# Patient Record
Sex: Male | Born: 1935 | Race: White | Hispanic: No | Marital: Married | Smoking: Former smoker
Health system: Southern US, Community
[De-identification: ages and names within clinical notes are randomized; demographics above are authoritative.]

## PROBLEM LIST (undated history)

## (undated) DIAGNOSIS — I6529 Occlusion and stenosis of unspecified carotid artery: Secondary | ICD-10-CM

## (undated) DIAGNOSIS — E785 Hyperlipidemia, unspecified: Secondary | ICD-10-CM

## (undated) DIAGNOSIS — H5440 Blindness, one eye, unspecified eye: Secondary | ICD-10-CM

## (undated) DIAGNOSIS — I10 Essential (primary) hypertension: Secondary | ICD-10-CM

## (undated) DIAGNOSIS — H9190 Unspecified hearing loss, unspecified ear: Secondary | ICD-10-CM

## (undated) HISTORY — DX: Essential (primary) hypertension: I10

## (undated) HISTORY — DX: Hyperlipidemia, unspecified: E78.5

## (undated) HISTORY — PX: CAROTID ENDARTERECTOMY: SUR193

## (undated) HISTORY — DX: Occlusion and stenosis of unspecified carotid artery: I65.29

---

## 2001-04-19 ENCOUNTER — Encounter: Payer: Self-pay | Admitting: *Deleted

## 2001-04-19 ENCOUNTER — Ambulatory Visit (HOSPITAL_COMMUNITY): Admission: RE | Admit: 2001-04-19 | Discharge: 2001-04-19 | Payer: Self-pay | Admitting: *Deleted

## 2002-07-07 ENCOUNTER — Ambulatory Visit (HOSPITAL_COMMUNITY): Admission: RE | Admit: 2002-07-07 | Discharge: 2002-07-07 | Payer: Self-pay | Admitting: Family Medicine

## 2002-07-07 ENCOUNTER — Encounter: Payer: Self-pay | Admitting: Family Medicine

## 2003-08-14 ENCOUNTER — Ambulatory Visit (HOSPITAL_COMMUNITY): Admission: RE | Admit: 2003-08-14 | Discharge: 2003-08-14 | Payer: Self-pay | Admitting: Family Medicine

## 2003-09-04 ENCOUNTER — Ambulatory Visit (HOSPITAL_COMMUNITY): Admission: RE | Admit: 2003-09-04 | Discharge: 2003-09-04 | Payer: Self-pay | Admitting: General Surgery

## 2005-01-27 ENCOUNTER — Ambulatory Visit (HOSPITAL_COMMUNITY): Admission: RE | Admit: 2005-01-27 | Discharge: 2005-01-27 | Payer: Self-pay | Admitting: Family Medicine

## 2005-11-19 ENCOUNTER — Emergency Department (HOSPITAL_COMMUNITY): Admission: EM | Admit: 2005-11-19 | Discharge: 2005-11-19 | Payer: Self-pay | Admitting: Emergency Medicine

## 2008-03-20 ENCOUNTER — Ambulatory Visit (HOSPITAL_COMMUNITY): Admission: RE | Admit: 2008-03-20 | Discharge: 2008-03-20 | Payer: Self-pay | Admitting: Family Medicine

## 2008-03-28 ENCOUNTER — Ambulatory Visit (HOSPITAL_COMMUNITY): Admission: RE | Admit: 2008-03-28 | Discharge: 2008-03-28 | Payer: Self-pay | Admitting: Family Medicine

## 2008-04-03 ENCOUNTER — Ambulatory Visit: Payer: Self-pay | Admitting: Vascular Surgery

## 2008-08-06 ENCOUNTER — Ambulatory Visit (HOSPITAL_COMMUNITY): Admission: RE | Admit: 2008-08-06 | Discharge: 2008-08-06 | Payer: Self-pay | Admitting: Family Medicine

## 2008-10-23 ENCOUNTER — Ambulatory Visit: Payer: Self-pay | Admitting: Vascular Surgery

## 2009-02-16 ENCOUNTER — Emergency Department (HOSPITAL_COMMUNITY): Admission: EM | Admit: 2009-02-16 | Discharge: 2009-02-16 | Payer: Self-pay | Admitting: Emergency Medicine

## 2009-02-17 ENCOUNTER — Inpatient Hospital Stay (HOSPITAL_COMMUNITY): Admission: EM | Admit: 2009-02-17 | Discharge: 2009-02-21 | Payer: Self-pay | Admitting: Emergency Medicine

## 2009-02-17 ENCOUNTER — Encounter: Payer: Self-pay | Admitting: Vascular Surgery

## 2009-02-17 ENCOUNTER — Encounter (INDEPENDENT_AMBULATORY_CARE_PROVIDER_SITE_OTHER): Payer: Self-pay | Admitting: Internal Medicine

## 2009-02-19 ENCOUNTER — Ambulatory Visit: Payer: Self-pay | Admitting: Surgery

## 2009-02-20 ENCOUNTER — Encounter: Payer: Self-pay | Admitting: Vascular Surgery

## 2009-03-19 ENCOUNTER — Ambulatory Visit: Payer: Self-pay | Admitting: Vascular Surgery

## 2009-03-28 ENCOUNTER — Ambulatory Visit: Payer: Self-pay | Admitting: Vascular Surgery

## 2009-03-28 ENCOUNTER — Encounter: Payer: Self-pay | Admitting: Vascular Surgery

## 2009-03-28 ENCOUNTER — Inpatient Hospital Stay (HOSPITAL_COMMUNITY): Admission: RE | Admit: 2009-03-28 | Discharge: 2009-03-29 | Payer: Self-pay | Admitting: Vascular Surgery

## 2009-04-16 ENCOUNTER — Ambulatory Visit: Payer: Self-pay | Admitting: Vascular Surgery

## 2009-09-28 IMAGING — CR DG CHEST 2V
2 series · 2 of 2 positions shown · non-contrast
Comparison: Chest 03/20/2008.

CLINICAL DATA: Preoperative respiratory film for patient with left
internal carotid artery stenosis.

CHEST - 2 VIEW

[view not recorded (1 of 2)]
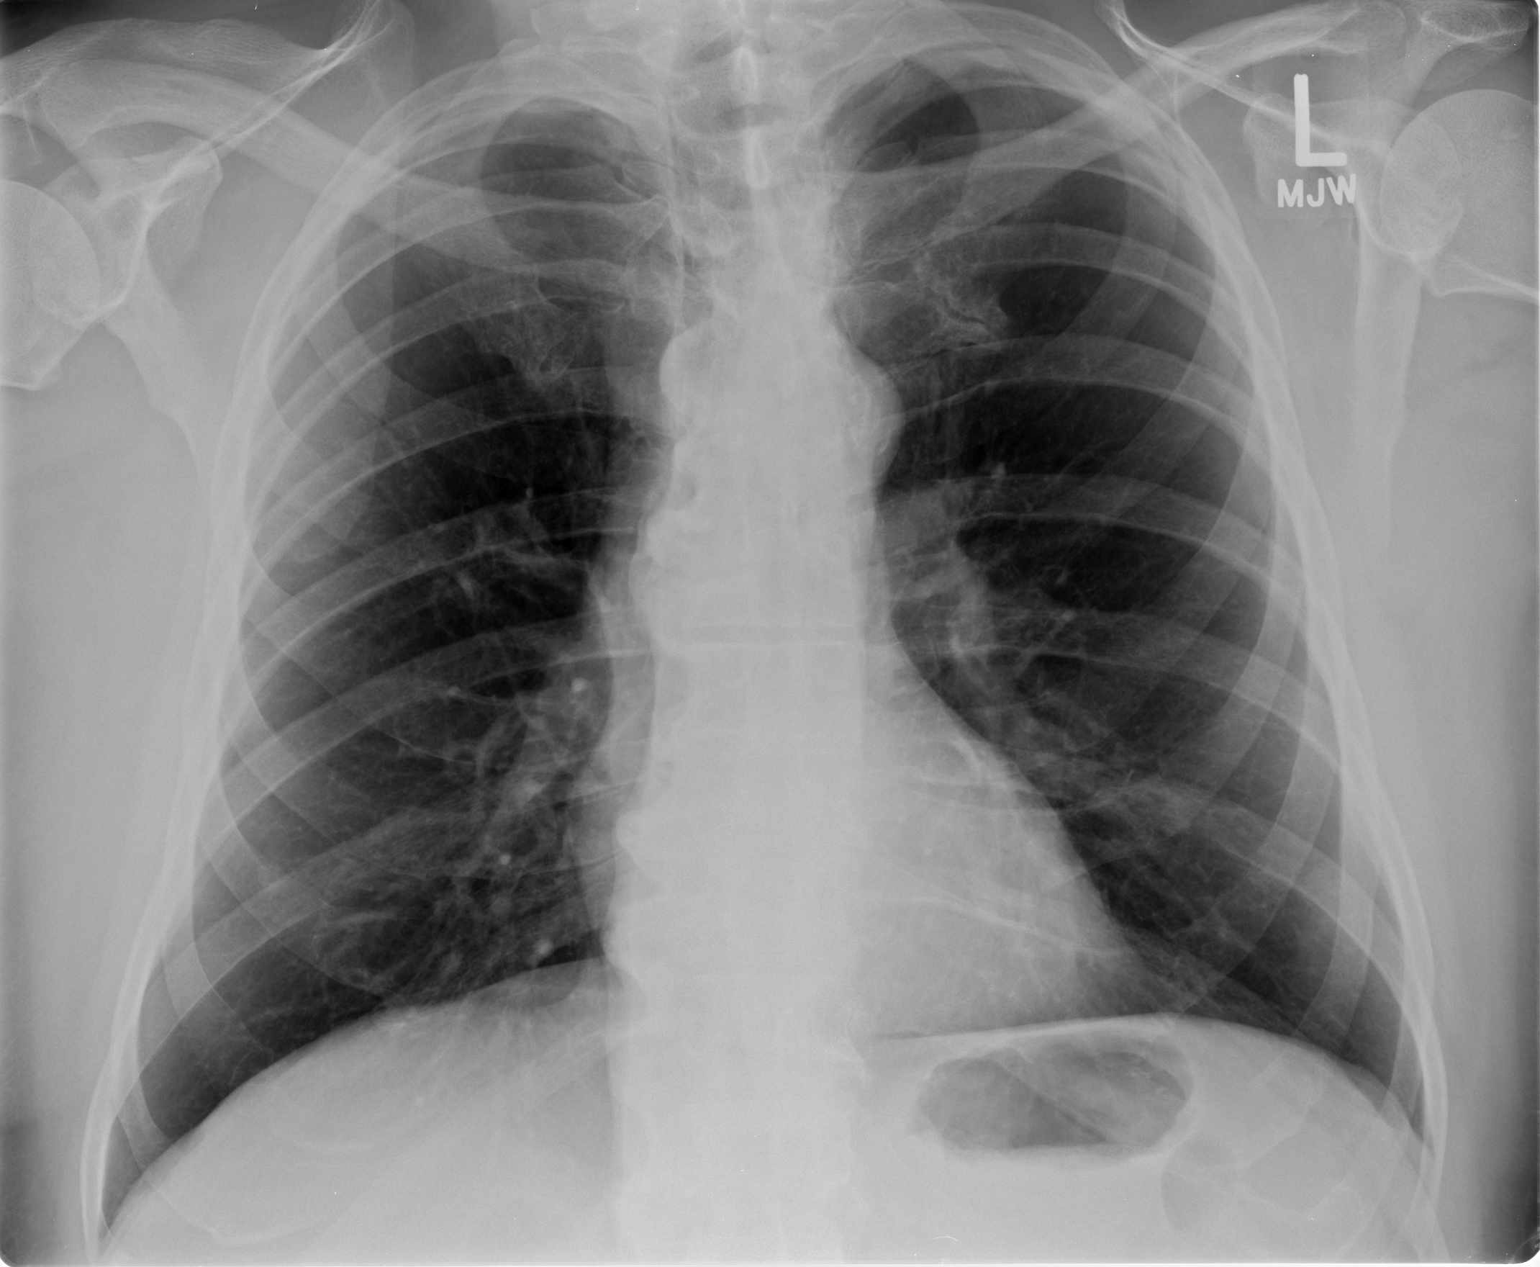

[view not recorded (2 of 2)]
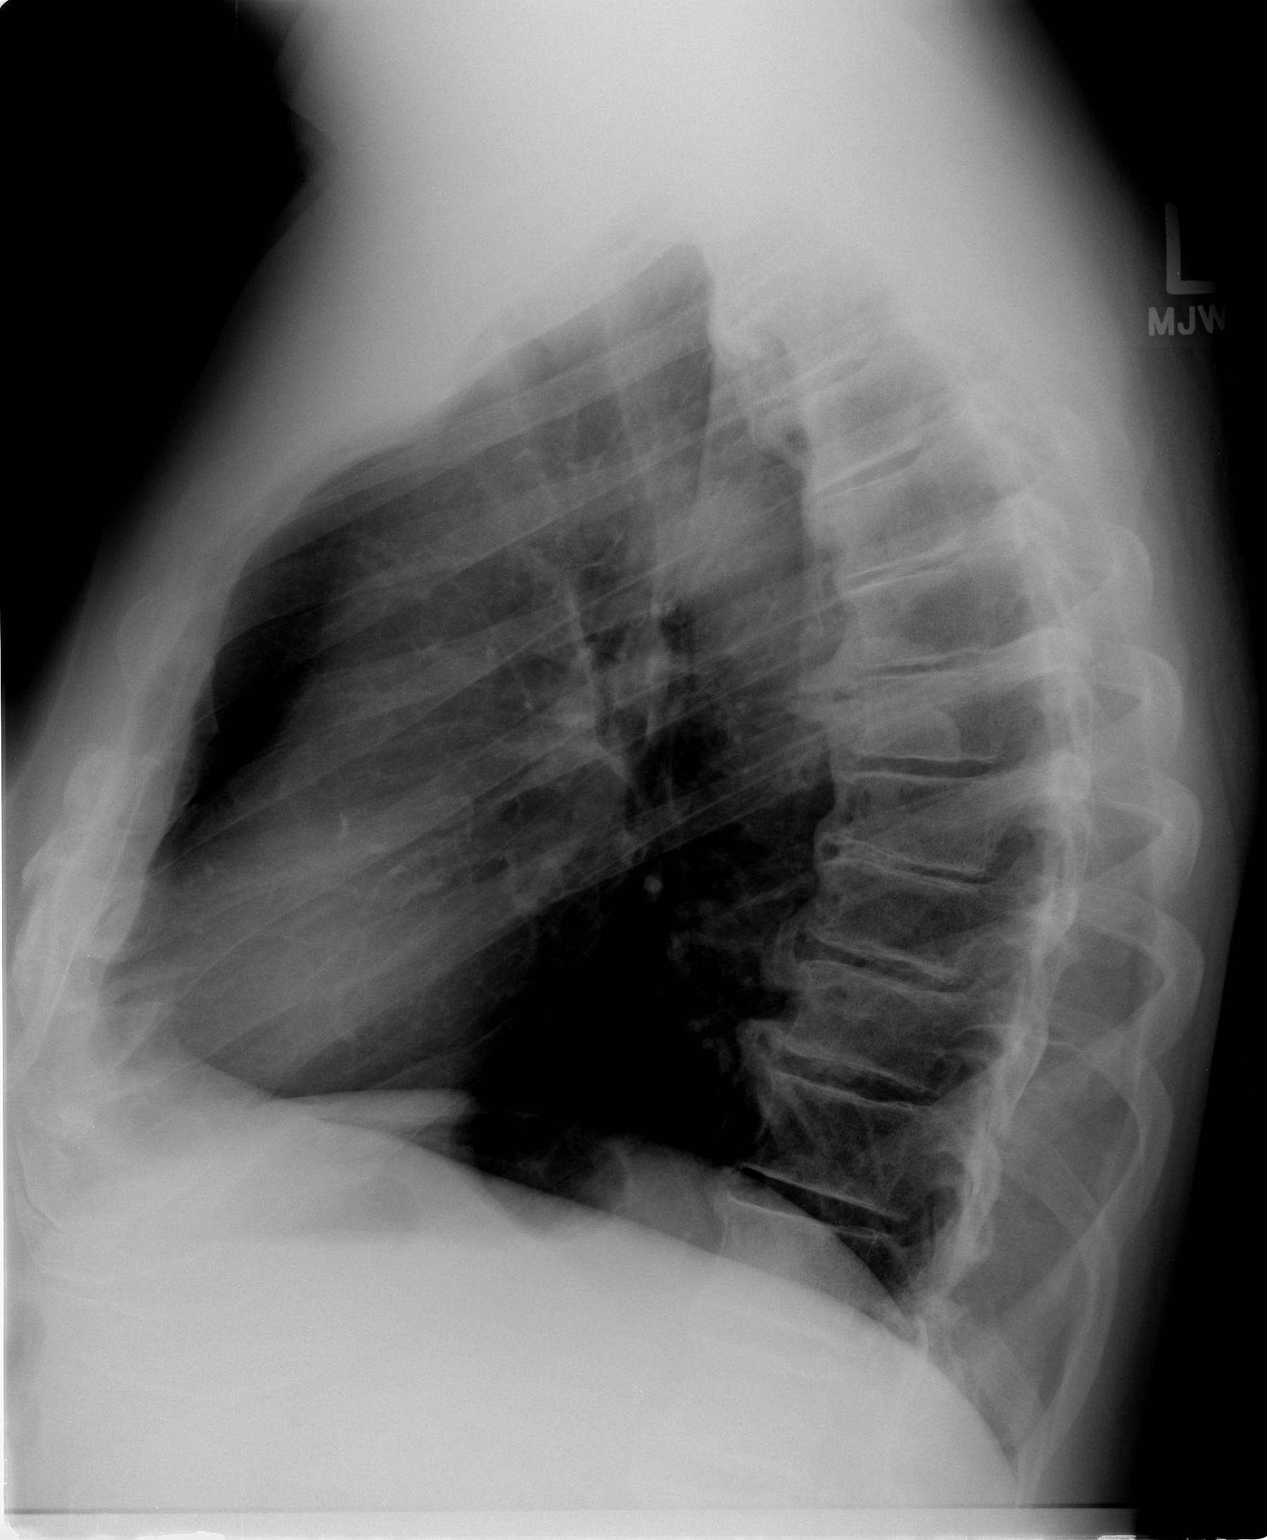

[2 of 2 positions shown; findings below may reference images not displayed]

FINDINGS: The chest appears hyperexpanded with flattening of the
hemidiaphragms.  Small nodular opacity in the right lower lung zone
is unchanged and likely the patient's nipple.  There is no focal
airspace disease or effusion.  Heart size is normal.  Marked
thoracic degenerative disease noted.
IMPRESSION: No acute finding.  Stable compared to prior exam.

## 2009-10-17 ENCOUNTER — Ambulatory Visit: Payer: Self-pay | Admitting: Vascular Surgery

## 2010-01-07 ENCOUNTER — Ambulatory Visit (HOSPITAL_COMMUNITY): Admission: RE | Admit: 2010-01-07 | Discharge: 2010-01-07 | Payer: Self-pay | Admitting: Internal Medicine

## 2010-08-12 ENCOUNTER — Ambulatory Visit: Payer: Self-pay | Admitting: Vascular Surgery

## 2010-10-26 ENCOUNTER — Encounter: Payer: Self-pay | Admitting: Family Medicine

## 2011-01-12 LAB — BASIC METABOLIC PANEL
CO2: 26 mEq/L (ref 19–32)
Creatinine, Ser: 0.79 mg/dL (ref 0.4–1.5)
GFR calc Af Amer: >60 mL/min (ref >60)
Potassium: 3.3 mEq/L — ABNORMAL LOW (ref 3.5–5.1)
Sodium: 134 mEq/L — ABNORMAL LOW (ref 135–145)

## 2011-01-12 LAB — COMPREHENSIVE METABOLIC PANEL
ALT: 30 U/L (ref 0–53)
AST: 34 U/L (ref 0–37)
CO2: 27 mEq/L (ref 19–32)
Calcium: 9.5 mg/dL (ref 8.4–10.5)
Creatinine, Ser: 0.95 mg/dL (ref 0.4–1.5)
Glucose, Bld: 96 mg/dL (ref 70–99)
Potassium: 4.1 mEq/L (ref 3.5–5.1)
Sodium: 138 mEq/L (ref 135–145)
Total Bilirubin: 1 mg/dL (ref 0.3–1.2)
Total Protein: 7.5 g/dL (ref 6.0–8.3)

## 2011-01-12 LAB — URINALYSIS, ROUTINE W REFLEX MICROSCOPIC
Bilirubin Urine: NEGATIVE
Glucose, UA: NEGATIVE mg/dL
Ketones, ur: NEGATIVE mg/dL
Leukocytes, UA: NEGATIVE
Nitrite: NEGATIVE
Protein, ur: NEGATIVE mg/dL
Specific Gravity, Urine: 1.02 (ref 1.005–1.030)
Urobilinogen, UA: 0.2 mg/dL (ref 0.0–1.0)
pH: 5.5 (ref 5.0–8.0)

## 2011-01-12 LAB — CBC
HCT: 35.3 % — ABNORMAL LOW (ref 39.0–52.0)
HCT: 40.8 % (ref 39.0–52.0)
Hemoglobin: 12.2 g/dL — ABNORMAL LOW (ref 13.0–17.0)
Hemoglobin: 13.9 g/dL (ref 13.0–17.0)
MCHC: 34.1 g/dL (ref 30.0–36.0)
MCHC: 34.7 g/dL (ref 30.0–36.0)
MCV: 95.3 fL (ref 78.0–100.0)
Platelets: 174 10*3/uL (ref 150–400)
RBC: 3.7 MIL/uL — ABNORMAL LOW (ref 4.22–5.81)
RBC: 4.27 MIL/uL (ref 4.22–5.81)
RDW: 13.1 % (ref 11.5–15.5)
WBC: 8.9 10*3/uL (ref 4.0–10.5)

## 2011-01-12 LAB — TYPE AND SCREEN: Antibody Screen: NEGATIVE

## 2011-01-12 LAB — APTT: aPTT: 30 seconds (ref 24–37)

## 2011-01-12 LAB — URINE MICROSCOPIC-ADD ON

## 2011-01-12 LAB — PROTIME-INR
INR: 1 (ref 0.00–1.49)
Prothrombin Time: 13.8 seconds (ref 11.6–15.2)

## 2011-01-12 LAB — ABO/RH: ABO/RH(D): A POS

## 2011-01-13 LAB — DIFFERENTIAL
Basophils Absolute: 0 10*3/uL (ref 0.0–0.1)
Eosinophils Absolute: 0.1 10*3/uL (ref 0.0–0.7)
Eosinophils Relative: 1 % (ref 0–5)
Monocytes Absolute: 0.5 10*3/uL (ref 0.1–1.0)

## 2011-01-13 LAB — CBC
HCT: 39.1 % (ref 39.0–52.0)
Hemoglobin: 13.7 g/dL (ref 13.0–17.0)
MCHC: 35.2 g/dL (ref 30.0–36.0)
MCHC: 35.8 g/dL (ref 30.0–36.0)
MCV: 94.9 fL (ref 78.0–100.0)
Platelets: 163 10*3/uL (ref 150–400)
Platelets: 184 10*3/uL (ref 150–400)
Platelets: 206 10*3/uL (ref 150–400)
RBC: 3.58 MIL/uL — ABNORMAL LOW (ref 4.22–5.81)
RBC: 4.12 MIL/uL — ABNORMAL LOW (ref 4.22–5.81)
RBC: 4.3 MIL/uL (ref 4.22–5.81)
RDW: 12.5 % (ref 11.5–15.5)
RDW: 12.5 % (ref 11.5–15.5)
WBC: 8.1 10*3/uL (ref 4.0–10.5)
WBC: 9 10*3/uL (ref 4.0–10.5)

## 2011-01-13 LAB — BASIC METABOLIC PANEL
BUN: 10 mg/dL (ref 6–23)
BUN: 7 mg/dL (ref 6–23)
CO2: 25 mEq/L (ref 19–32)
CO2: 27 mEq/L (ref 19–32)
Calcium: 8.8 mg/dL (ref 8.4–10.5)
Calcium: 9.3 mg/dL (ref 8.4–10.5)
Chloride: 105 mEq/L (ref 96–112)
Creatinine, Ser: 0.83 mg/dL (ref 0.4–1.5)
Creatinine, Ser: 0.84 mg/dL (ref 0.4–1.5)
GFR calc Af Amer: >60 mL/min (ref >60)
GFR calc Af Amer: >60 mL/min (ref >60)
GFR calc non Af Amer: >60 mL/min (ref >60)
Glucose, Bld: 108 mg/dL — ABNORMAL HIGH (ref 70–99)
Glucose, Bld: 120 mg/dL — ABNORMAL HIGH (ref 70–99)
Potassium: 3.7 mEq/L (ref 3.5–5.1)
Sodium: 139 mEq/L (ref 135–145)

## 2011-01-13 LAB — HEMOGLOBIN A1C
Hgb A1c MFr Bld: 5.6 % (ref 4.6–6.1)
Mean Plasma Glucose: 114 mg/dL

## 2011-01-13 LAB — COMPREHENSIVE METABOLIC PANEL
ALT: 38 U/L (ref 0–53)
AST: 36 U/L (ref 0–37)
Albumin: 4.2 g/dL (ref 3.5–5.2)
CO2: 26 mEq/L (ref 19–32)
Chloride: 103 mEq/L (ref 96–112)
GFR calc Af Amer: >60 mL/min (ref >60)
GFR calc non Af Amer: >60 mL/min (ref >60)
Potassium: 3.6 mEq/L (ref 3.5–5.1)
Sodium: 138 mEq/L (ref 135–145)
Total Bilirubin: 0.6 mg/dL (ref 0.3–1.2)

## 2011-01-13 LAB — PROTIME-INR
INR: 1.8 — ABNORMAL HIGH (ref 0.00–1.49)
Prothrombin Time: 21.7 seconds — ABNORMAL HIGH (ref 11.6–15.2)

## 2011-01-13 LAB — LIPID PANEL
Cholesterol: 203 mg/dL — ABNORMAL HIGH (ref 0–200)
Total CHOL/HDL Ratio: 5.6 RATIO

## 2011-01-13 LAB — SEDIMENTATION RATE: Sed Rate: 20 mm/hr — ABNORMAL HIGH (ref 0–16)

## 2011-01-13 LAB — APTT: aPTT: >200 seconds (ref 24–37)

## 2011-01-13 LAB — HOMOCYSTEINE: Homocysteine: 7.6 umol/L (ref 4.0–15.4)

## 2011-01-13 LAB — C-REACTIVE PROTEIN: CRP: 0.1 mg/dL — ABNORMAL LOW (ref <0.6)

## 2011-02-17 NOTE — Consult Note (Signed)
NAME:  Jeffrey Ho, MOTTON NO.:  [id number]   MEDICAL RECORD NO.:  [id number]          PATIENT TYPE:  OBV   LOCATION:  3012                         FACILITY:  [facility]   PHYSICIAN:  Quita Skye. Hart Rochester, M.D.  DATE OF BIRTH:  [birth year]   DATE OF CONSULTATION:  02/17/2009  DATE OF DISCHARGE:                                 CONSULTATION   REFERRED BY:  [facility].   REASON FOR CONSULTATION:  Bilateral carotid disease with right retinal  embolus.   HISTORY OF PRESENT ILLNESS:  This is a 76 year old male patient  previously evaluated by Dr. Waverly Ferrari for asymptomatic  bilateral carotid disease developed acute onset loss of vision in the  right eye, 5:30 p.m. on Feb 16, 2009 was seen by his ophthalmologist who  diagnosed a right central retinal artery occlusion.  The patient was  admitted to [facility].  He had no previous history of hemiparesis,  aphasia, amaurosis fugax, diplopia, blurred vision, or syncope.  CT scan  revealed no evidence of acute stroke or hemorrhage.   PAST MEDICAL HISTORY:  Positive for hypertension and hyperlipidemia.   SOCIAL HISTORY:  The patient is a nonsmoker.   ALLERGIES:  None known.   MEDICATIONS:  1. Aspirin.  2. Simvastatin.  3. Toprol-XL.  4. Hydrochlorothiazide.  Exam  Neurologic----Blindness right eye----No other focal defecits noted   LABORATORY DATA:  Carotid duplex exam performed on Feb 17, 2009 revealed  a 70% right internal carotid stenosis and 80% left internal carotid  stenosis.  MRI scan was being performed at the time of this dictation  and results were not available.   IMPRESSION:  Moderate bilateral carotid occlusive disease, left greater  than right with acute right retinal artery embolus.   PLAN:  We will obtain the office records and notify Dr. Edilia Bo to  followup with this patient tomorrow for further recommendations.  Certainly, embolic event from right carotid disease is likely,  although  the carotid disease on the left seems more severe than on the right.      Quita Skye Hart Rochester, M.D.  Electronically Signed     JDL/MEDQ  D:  02/17/2009  T:  02/18/2009  Job:  [id number]

## 2011-02-17 NOTE — Assessment & Plan Note (Signed)
OFFICE VISIT   ARTIST, BLOOM  DOB:  January 14, 1935                                       04/16/2009  ZSWFU#:[id number]   I saw the patient in the office today for followup after his recent left  carotid endarterectomy on 03/28/2009.  This is a 76 year old gentleman  who had a retinal infarct in the right eye and was found to have a  moderate right carotid stenosis.  He underwent right carotid  endarterectomy.  During his workup for his previous retinal infarct in  the right he had evidence of a greater than 80% left carotid stenosis  that was fairly high.  Arteriogram did show that this did appear to be  surgically accessible however.  He was brought back for staged left  carotid endarterectomy on 03/28/2009.   He did well postoperatively and was discharged on postoperative day #1.  He returns for his first followup visit.  Overall he has been doing  quite well and has had no focal weakness or paresthesias.   PHYSICAL EXAMINATION:  This is a pleasant 77 year old gentleman who  appears his stated age.  Blood pressure is 144/85, heart rate is 69.  Both neck incisions have healed nicely.  Lungs are clear bilaterally to  auscultation.  Neurologic exam is nonfocal.   Overall I am pleased with his progress and I will see him back in 6  months for a followup carotid duplex scan.  He knows to call sooner if  he has problems.  In the meantime he knows to continue taking his  aspirin.   Di Kindle. Edilia Bo, M.D.  Electronically Signed   CSD/MEDQ  D:  04/16/2009  T:  04/17/2009  Job:  [id number]   cc:   Vania Rea, M.D.  Patrica Duel, M.D.  Timothy R. Vonna Kotyk, MD

## 2011-02-17 NOTE — Op Note (Signed)
NAME:  Jeffrey Ho, Jeffrey Ho               ACCOUNT NO.:  [id number]   MEDICAL RECORD NO.:  [id number]          PATIENT TYPE:  OBV   LOCATION:  3012                         FACILITY:  [facility]   PHYSICIAN:  Juleen China IV, MDDATE OF BIRTH:  20-Feb-1935   DATE OF PROCEDURE:  02/19/2009  DATE OF DISCHARGE:                               OPERATIVE REPORT   PREOPERATIVE DIAGNOSIS:  Symptomatic right carotid stenosis.   POSTOPERATIVE DIAGNOSIS:  Symptomatic right carotid stenosis.   PROCEDURE PERFORMED:  1. Ultrasound access right femoral artery.  2. Aortic arch angiogram.  3. Bilateral carotid angiogram.  4. Retrograde right femoral angiogram with closure device (ProGlide)   INDICATIONS:  This is a 76 year old gentleman presented with sentinel  retinal artery occlusion.  Duplex has revealed moderate right carotid  stenosis and high-grade left carotid stenosis.  He comes in today for  arteriogram to fully evaluate the extent of his disease.   PROCEDURE:  The patient was identified in the holding area and taken to  the room where he was placed supine on the table.  Bilateral groins were  prepped and draped in the standard sterile fashion.  A time-out was  called.  Right femoral artery by the ultrasound found to be widely  patent.  It is accessed under ultrasound guidance with an 18-gauge  needle.  A Bentson wire was advanced into the aorta under fluoroscopic  visualization.  A 5-French sheath was placed.  Over the wire, an Omni  flush catheter was placed.  Into the ascending aorta, the image  intensifier was rotated in a left anterior oblique position and aortic  arch arteriogram was performed.  Next, using a Berenstein II catheter,  the right carotid and left carotid artery were individually selected and  contrast injections were performed.   FINDINGS:  Aortogram:  A type 1 aortic arch is visualized.  No stenosis  is visualized within the innominate artery.  The right vertebral arises  from  the right subclavian artery appears to be widely patent.  There are  no subclavian lesions.  The right common carotid artery is patent.  The  left common carotid artery is patent.  The left vertebral artery is  patent throughout its course and appears to the branch of the aortic  arch; however, this is not fully evaluated.  The left subclavian artery  is widely patent.   Right carotid angiogram:  The right common carotid artery is widely  patent without significant disease than its proximal extent.  There is  focal area of calcification and luminal narrowing at the origin of the  internal carotid and the distal common carotid artery.  There is obvious  plaque in this area that extended up for approximately 2 cm, the distal  end of the plaque is below the angle of the mandible.  Stenosis is in  the 55-60% range.  The external carotid artery is widely patent.   Left carotid artery:  The left common carotid artery is patent  throughout its course.  The left external iliac artery is patent  throughout its course.  The proximal left internal  carotid artery is  widely patent.  There is a 70-80% stenosis in the mid internal carotid  artery just below the angle of the mandible.   Intracranial images will be dictated separately by Neuroradiology.   After these images were obtained, catheter was removed.  A retrograde  right femoral angiogram was performed to evaluate for closure device.  The access was adequate and a probe wire was then successfully deployed.   The patient tolerated the procedure well.  There were no neurologic  complications.  He was taken back to his room in stable condition.   IMPRESSION:  1. Approximately 60% stenosis with significant ulcerated plaque and      calcification in the right internal carotid artery.  2. 70-80% stenosis in the mid left internal carotid artery.      Jorge Ny, MD  Electronically Signed     VWB/MEDQ  D:  02/19/2009  T:   02/20/2009  Job:  [id number]

## 2011-02-17 NOTE — Discharge Summary (Signed)
NAME:  Jeffrey Ho, Jeffrey Ho NO.:  [id number]   MEDICAL RECORD NO.:  [id number]          PATIENT TYPE:  INP   LOCATION:  3316                         FACILITY:  [facility]   PHYSICIAN:  Di Kindle. Edilia Bo, M.D.DATE OF BIRTH:  [birth year]   DATE OF ADMISSION:  03/28/2009  DATE OF DISCHARGE:  03/29/2009                               DISCHARGE SUMMARY   The patient of Dr. Edilia Bo.   FINAL DISCHARGE DIAGNOSES:  1. Left carotid occlusive disease, 70-80% and right carotid artery      stenosis, 60%.  2. Hypertension.  3. Dyslipidemia.   PROCEDURE PERFORMED:  Left carotid endarterectomy.   COMPLICATIONS:  None.   DISCHARGE MEDICATIONS:  1. Simvastatin 40 mg p.o. daily.  2. Toprol 50 mg p.o. daily.  3. Hydrochlorothiazide 25 mg p.o. daily.  4. Aspirin 81 mg p.o. daily.  5. He was given a prescription for oxycodone 5 mg p.o. q.4 h. p.r.n.      pain, total #20 were given.   DISPOSITION:  He is being discharged home in stable condition following  careful instructions regarding the care of his wounds and activity  level.  He is to see Dr. Edilia Bo in 2 weeks for continuing followup.  The office will arrange the visit.   BRIEF IDENTIFYING STATEMENT:  For complete details, please refer to the  typed history and physical.  Briefly, this very pleasant 76 year old  gentleman was referred to Dr. Edilia Bo with bilateral carotid artery  stenosis.  He has earlier undergone a right carotid endarterectomy and  now returns for his left carotid endarterectomy.   HOSPITAL COURSE:  Preoperative workup was completed as an outpatient.  He was brought in through same day surgery on March 28, 2009, and  underwent the aforementioned left carotid endarterectomy.  For complete  details, please refer to the typed operative report.  The procedure was  without complications.  He was returned to the post anesthesia care unit  extubated.  Following stabilization, he was transferred to a bed on the  surgical step down unit.  He was observed overnight.  The following morning, he was desirous of  discharge.  He was found to be in stable condition.  He was at his  baseline neurologically, which included a field cut in his right eye.  He was discharged home in stable condition on March 29, 2009.      Wilmon Arms, PA      Di Kindle. Edilia Bo, M.D.  Electronically Signed    KEL/MEDQ  D:  03/29/2009  T:  03/29/2009  Job:  [id number]

## 2011-02-17 NOTE — Discharge Summary (Signed)
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   SECONDARY DIAGNOSES:  1. Bilateral carotid occlusive disease.  2. Hypertension.  3. Hypercholesterolemia.   HOSPITAL COURSE:  1. Acute retinal hemorrhage.  The patient is a 76 year old male who      was admitted secondary to acute right eye blindness.  The patient      was seen his ophthalmologist, transferred to the emergency      department, and admitted to our service.  The patient was seen by      vascular.  He had an evaluation of his carotid arteries.  He was      found to have bilateral occlusive carotid artery disease.  He is      now status post a right CEA and he is scheduled to have a left CEA      4 weeks after discharge.  The patient will more than likely not      regain sight in the right eye, but he is otherwise stable and ready      for discharge.  2. Hypertension, relatively well controlled on his current medication,      this will not be altered.  3. Hypercholesterolemia.  The patient will continue his statin      therapy.   LABORATORIES OF NOTE:  1. Mild anemia with a hemoglobin of 12.0 and hematocrit of 33.7.  2. Initial elevated INR 1.8, decreasing to 1.0.  3. Mildly elevated blood glucose of 120.  4. Total cholesterol of 203 with triglycerides of 127, HDL of 36, and      LDL of 142.  5. TSH of 2.897.  6. CRP 0.1.  7. Homocystine 7.6.   STUDIES:  1. MRI/MRA of the neck with and without contrast showing no evidence      of acute brain infarct; however, an 85% diameter focal stenosis in      left internal  carotid artery was noted as well as a 63% stenosis in      the proximal right internal carotid artery.  2. Carotid artery Dopplers showing 50-60% stenosis of the right      internal carotid artery and 70% of the left internal carotid      artery.   MEDICATIONS ON DISCHARGE:  1. Simvastatin 80 mg p.o. daily.  2. Toprol-XL 50 mg p.o. daily.  3. Hydrochlorothiazide 12.5 mg p.o. daily.  4. Percocet 5/325 one p.o. q.4 h. p.r.n. pain.  5. Plavix 75 mg p.o. daily x3 weeks.   There are no lab studies pending at time of discharge.  The patient is  in stable condition and anxious for discharge.   Time spent  35 minutes.      Renee Ramus, MD  Electronically Signed     JF/MEDQ  D:  02/21/2009  T:  02/22/2009  Job:  [id number]   cc:   Patrica Duel, M.D.  Di Kindle. Edilia Bo, M.D.

## 2011-02-17 NOTE — Procedures (Signed)
CAROTID DUPLEX EXAM   INDICATION:  Bilateral carotid bruits.   HISTORY:  Diabetes:  No.  Cardiac:  No.  Hypertension:  Yes.  Smoking:  No.  Previous Surgery:  No.  CV History:  On 03/28/08, previous duplex revealed a 70-79% left  internal carotid artery stenosis.  Amaurosis Fugax No, Paresthesias No, Hemiparesis No.                                       RIGHT             LEFT  Brachial systolic pressure:         156               160  Brachial Doppler waveforms:         Triphasic         Triphasic  Vertebral direction of flow:        Antegrade         Antegrade  DUPLEX VELOCITIES (cm/sec)  CCA peak systolic                   108               80  ECA peak systolic                   141               143  ICA peak systolic                   158               276  ICA end diastolic                   36                79  PLAQUE MORPHOLOGY:                  Mixed calcified   Calcified  PLAQUE AMOUNT:                      Moderate          Moderate  PLAQUE LOCATION:                    Proximal ICA      Proximal ICA   IMPRESSION:  1. 40-59% right internal carotid artery stenosis.  2. 60-79% left internal carotid artery stenosis.       ___________________________________________  Di Kindle. Edilia Bo, M.D.   MC/MEDQ  D:  10/23/2008  T:  10/23/2008  Job:  [id number]

## 2011-02-17 NOTE — Procedures (Signed)
CAROTID DUPLEX EXAM   INDICATION:  Followup of carotid disease.   HISTORY:  Diabetes:  No.  Cardiac:  No.  Hypertension:  Yes.  Smoking:  No.  Previous Surgery:  Right carotid endarterectomy 02/20/2009, left carotid  endarterectomy 03/28/2009.  CV History:  Asymptomatic.  Amaurosis Fugax No, Paresthesias No, Hemiparesis No                                       RIGHT             LEFT  Brachial systolic pressure:         142               143  Brachial Doppler waveforms:         Triphasic         Triphasic  Vertebral direction of flow:        Antegrade         Antegrade  DUPLEX VELOCITIES (cm/sec)  CCA peak systolic                   107               115  ECA peak systolic                   166               163  ICA peak systolic                   66                111  ICA end diastolic                   28                37  PLAQUE MORPHOLOGY:                  Mixed             Mixed  PLAQUE AMOUNT:                      Mild              Moderate  PLAQUE LOCATION:                    CCA, ECA          CCA, ICA, ECA   IMPRESSION:  1. Patent right internal carotid artery with no evidence of      restenosis.  2. Evidence of 40-59% restenosis left internal carotid artery.  3. Bilateral external carotid artery stenosis.   ___________________________________________  Di Kindle. Edilia Bo, M.D.   CJ/MEDQ  D:  10/18/2009  T:  10/18/2009  Job:  [id number]

## 2011-02-17 NOTE — Discharge Summary (Signed)
NAME:  Jeffrey Ho, Jeffrey Ho NO.:  [id number]   MEDICAL RECORD NO.:  [id number]          PATIENT TYPE:  INP   LOCATION:  3302                         FACILITY:  [facility]   PHYSICIAN:  Di Kindle. Edilia Bo, M.D.DATE OF BIRTH:  1935-03-28   DATE OF ADMISSION:  02/16/2009  DATE OF DISCHARGE:  02/21/2009                               DISCHARGE SUMMARY   REASON FOR ADMISSION:  Right retinal infarct with bilateral carotid  disease.   HISTORY:  This is a 76 year old gentleman who has had moderate bilateral  carotid disease.  He presented with sudden blindness in the right eye  consistent with a retinal infarct and this was documented on an  ophthalmologic exam by Dr. Vonna Kotyk.  It was felt that he had a central  retinal artery occlusion in the right eye.  He was admitted on Feb 16, 2009.  His workup included a carotid duplex scan which showed evidence  of approximately 60% right carotid stenosis with 70-80% left carotid  stenosis.  He underwent a cerebral arteriogram which demonstrated a  calcific bulky irregular plaque in the right internal carotid artery and  also an approximately 80% stenosis in the internal carotid artery on the  left which was with a focal plaque about 2-3 cm above the bifurcation.  His right side was symptomatic.  Right carotid endarterectomy was  recommended in order to lower his risk of future stroke.  The procedure  and potential complications were discussed with the patient.  The  remainder of his history and physical is as dictated without addition or  deletion.   HOSPITAL COURSE:  After the patient's workup was completed, right  carotid endarterectomy was recommended.  He underwent a right carotid  endarterectomy with Dacron patch angioplasty on Feb 20, 2009.  He awoke  neurologically intact and did well postoperatively.  His blood pressure  remained well controlled and by postoperative day #1 he was afebrile.  His vital signs were stable.  He had  no significant laboratory  abnormalities.  He was ambulating, eating, and was ready for discharge  on postoperative day #1.   DISCHARGE DIAGNOSIS:  Symptomatic right carotid stenosis with right  retinal artery occlusion.   PROCEDURES:  1. Arch aortogram and cerebral arteriogram on Feb 19, 2009.  2. On Feb 20, 2009, right carotid endarterectomy with Dacron patch      angioplasty.   SECONDARY DIAGNOSES:  1. Hypertension.  2. Hyperlipidemia.   CONDITION ON DISCHARGE:  Good.   DISCHARGE MEDICATIONS:  1. Plavix 75 mg p.o. daily x3 weeks.  This will be discontinued prior      to his left carotid endarterectomy which is planned in      approximately 4 weeks.  2. Aspirin 81 mg p.o. daily.  3. Simvastatin 40 mg p.o. at bedtime.  4. Toprol-XL 50 mg p.o. daily.  5. Hydrochlorothiazide 12.5 mg p.o. daily.  6. Multivitamin one p.o. daily.  7. Fish oil daily.   DISPOSITION:  To home.      Di Kindle. Edilia Bo, M.D.  Electronically Signed     CSD/MEDQ  D:  02/21/2009  T:  02/21/2009  Job:  [id number]   cc:   Patrica Duel, M.D.  Vania Rea, M.D.  Timothy R. Vonna Kotyk, MD

## 2011-02-17 NOTE — Consult Note (Signed)
VASCULAR SURGERY CONSULTATION   Jeffrey Ho, Jeffrey Ho  DOB:  16-Nov-1934                                       04/03/2008  EAVWU#:[id number]   I saw the patient in the office today in consultation concerning  bilateral carotid disease.  He was referred by Dr. Nobie Putnam.  This is a  pleasant 76 year old gentleman who is right-handed and was found to have  a carotid bruit.  This prompted a duplex scan which showed evidence of a  60-79% left carotid stenosis with a 40-59% right carotid stenosis.  He  was sent for vascular consultation.  Of note, he denies any previous  history of stroke, TIAs, expressive or receptive aphasia, or amaurosis  fugax.  He has had no problems with dizziness.   PAST MEDICAL HISTORY:  Significant for hypertension and  hypercholesterolemia.  He denies any history of diabetes, history of  previous myocardial infarction, history of congestive heart failure or  history of COPD.   FAMILY HISTORY:  His father had a myocardial infarction at age 54.  He  is unaware of any other history of premature cardiovascular disease.   SOCIAL HISTORY:  He is married.  He has 4 children.  He quit tobacco in  1972.  He does not use alcohol on a regular basis.   Review of systems and medications are documented on the medical history  form in his chart.   PHYSICAL EXAMINATION:  This is a pleasant 76 year old gentleman who  appears his stated age.  Blood pressure is 164/82, heart rate is 76.  He  has bilateral carotid bruits.  Neck is supple.  There is no cervical  lymphadenopathy.  Lungs are clear bilaterally to auscultation.  Cardiac  exam, he has a regular rate and rhythm.  His abdomen is soft and  nontender.  I cannot palpate an aneurysm.  He has normal pitched bowel  sounds.  He has palpable femoral pulses and warm well perfused without  ischemic ulcers.  Neurologic:  Exam is nonfocal.  He has no significant  lower extremity swelling.   I reviewed his duplex  scan.  By velocity criteria, he has a 60-79% left  carotid stenosis with 40 to 59% right carotid stenosis.  I do note a  small ulcer in the common carotid artery plaque on the left.   I have explained that, given that he is asymptomatic, we generally would  not consider carotid endarterectomy unless the stenosis progressed to  greater than 80%.  Likewise, we would generally not address a carotid  ulcer unless it was symptomatic.  I have recommend a followup duplex  scan in 6 months and will see him back at that time.  If the stenosis  progresses to greater than 80% or he develops new neurologic symptoms,  we would consider carotid endarterectomy.  He knows to call sooner if he  has problems.  In the meantime he knows to continue taking his aspirin.  Of note, I did not think a CT angiogram would be helpful as this would  not change my approach to this asymptomatic plaque.  Likewise I did not  think it would be good for evaluating for ulceration.  In addition, the  only way to really further evaluate the carotid ulcer would be cerebral  arteriography, which is associated with a 1% to 2% risk of stroke.  For  this reason, given he is asymptomatic, I will simply follow this with  duplex.   Di Kindle. Edilia Bo, M.D.  Electronically Signed  CSD/MEDQ  D:  04/03/2008  T:  04/04/2008  Job:  [id number]   cc:   Patrica Duel, M.D.

## 2011-02-17 NOTE — Op Note (Signed)
NAME:  CANYON, LOHR NO.:  [id number]   MEDICAL RECORD NO.:  [id number]          PATIENT TYPE:  INP   LOCATION:  3316                         FACILITY:  [facility]   PHYSICIAN:  Di Kindle. Edilia Bo, M.D.DATE OF BIRTH:  May 18, 1935   DATE OF PROCEDURE:  03/28/2009  DATE OF DISCHARGE:                               OPERATIVE REPORT   PREOPERATIVE DIAGNOSIS:  Asymptomatic greater than 80% left carotid  stenosis.   POSTOPERATIVE DIAGNOSIS:  Asymptomatic greater than 80% left carotid  stenosis.   PROCEDURE:  Left carotid endarterectomy with Dacron patch angioplasty.   SURGEON:  Di Kindle. Edilia Bo, MD   ASSISTANT:  Jerold Coombe, PA   ANESTHESIA:  General.   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  He underwent right carotid endarterectomy.  Prior to this, however, he  had a cerebral arteriogram which showed that he had a moderate left  carotid stenosis at the bifurcation but 80% or greater stenosis beyond  this in the internal carotid artery.  Left carotid endarterectomy was  recommended in order to lower his risk of stroke.  It appeared that this  lesion, although high was surgically accessible.  The procedure and  potential complications were discussed with the patient preoperatively.  All of his questions were answered, and he was agreeable to proceed.   TECHNIQUE:  The patient was taken to the operating room and received a  general anesthetic.  Arterial line had been placed by Anesthesia.  The  left neck was prepped and draped in the usual sterile fashion.  An  incision was made along the anterior border of the sternocleidomastoid,  and the dissection was carried down to the common carotid artery which  was dissected free and controlled with Rumel tourniquet.  The facial  vein was divided between 2-0 silk ties, and the internal carotid artery  was  identified.  Of note, the plaque extended very high, and I had to  dissect up quite high in order to get above the plaque which was  difficult.  The external carotid artery was controlled with a blue  vessel loop, and the superior thyroid artery was controlled.  The  patient was then heparinized.  The internal carotid artery was then  clamped, and then the common and external carotid arteries were  controlled.  A longitudinal arteriotomy was made in the common carotid  artery.  This was extended up to the plaque.  It was really difficult to  get into the internal carotid artery to the plaque.  It was so tight and  therefore I made a small longitudinal arteriotomy above the plaque in  the internal carotid artery and then connected to.  I was able to get a  12 shunt into the internal carotid artery.  This was then backbled and  placed into the common carotid artery and secured with Rumel tourniquet.  Flow was reestablished to the shunt.  Endarterectomy plane was  established proximally, and the plaque was sharply divided.  Eversion  endarterectomy was performed of the external carotid artery.  Distally,  the plaque extended very high, but I was able to endarterectomize it,  and there was a nice feathering of the plaque with no residual stenosis.  The artery was then irrigated with copious amounts of heparin and  dextran, and all loose debris removed.  Because of how high the  arteriotomy was, it was really not possible to visualize distally.  I  therefore removed the shunt as there was excellent backbleeding, and the  internal carotid artery was clamped to allow much better visualization.  A Dacron patch was sewn using continuous 6-0 Prolene suture.  Prior to  completing the patch closure, the artery was backbled and flushed  appropriately, and the anastomosis completed.  Flow was reestablished  first to the external carotid artery and into the internal carotid  artery.  There was a good  Doppler signal distal to the patch and a good  pulse.  Hemostasis was obtained in the wounds, and the heparin was  partially reversed with protamine.  The deep layer was closed with a  running 3-0 Vicryl.  The platysma was closed with running 3-0 Vicryl.  The skin was closed with a 4-0 subcuticular stitch.  A sterile dressing  was applied.  The patient tolerated the procedure well, was transferred  to the recovery room in satisfactory condition.  All needle and sponge  counts were correct.      Di Kindle. Edilia Bo, M.D.  Electronically Signed     CSD/MEDQ  D:  03/28/2009  T:  03/29/2009  Job:  [id number]   cc:   Patrica Duel, M.D.  Vania Rea, M.D.  Timothy R. Vonna Kotyk, MD

## 2011-02-17 NOTE — Op Note (Signed)
NAME:  Jeffrey Ho, RIO NO.:  [id number]   MEDICAL RECORD NO.:  [id number]          PATIENT TYPE:  INP   LOCATION:  3302                         FACILITY:  [facility]   PHYSICIAN:  Di Kindle. Edilia Bo, M.D.DATE OF BIRTH:  04/04/1935   DATE OF PROCEDURE:  02/20/2009  DATE OF DISCHARGE:                               OPERATIVE REPORT   PREOPERATIVE DIAGNOSIS:  Symptomatic right carotid stenosis with retinal  infarct.   POSTOPERATIVE DIAGNOSIS:  Symptomatic right carotid stenosis with  retinal infarct.   PROCEDURE:  Right carotid endarterectomy with Dacron patch angioplasty.   SURGEON:  Di Kindle. Edilia Bo, MD   ASSISTANT:  Wilmon Arms, PA   ANESTHESIA:  General.   INDICATIONS:  This is a 76 year old gentleman who has been following  with moderate carotid disease bilaterally on the right side.  He had a  moderate 60% stenosis and a 60-79% left carotid stenosis.  He presented  with acute blindness in the right eye on Feb 16, 2009.  Carotid duplex  scan suggested a moderate 60% right carotid stenosis with approximately  80% left carotid stenosis.  He underwent an arteriogram which showed  that the stenosis on the right was surgically accessible and he had a  regular calcific plaque at the bifurcation.  It was felt to be  responsible for his retinal infarct.  Right carotid endarterectomy was  recommended in order to lower his risk of future stroke.   TECHNIQUE:  The patient was taken to the operating room and received a  general anesthetic.  Arterial line had been placed by Anesthesia.  The  right neck was prepped and draped in the usual sterile fashion.  The  incision was made along the anterior border of the sternocleidomastoid  and the dissection carried down to the common carotid artery which was  dissected free and controlled with a Rumel tourniquet.  The facial vein  was divided between 2-0 silk ties.  The internal carotid arteries  controlled above  the plaque.  Of note, the bifurcation was somewhat low  and the plaque extended very high.  The external and superior thyroid  arteries were also controlled.  The patient then received 9000 units of  IV heparin.  Clamps were then placed on the internal, then the common,  and then the external carotid artery.  A longitudinal arteriotomy was  made in the common carotid artery and this was extended through the  plaque into the internal carotid artery above the plaque.  A 12 shunt  was placed into the internal carotid artery, back-bled, and then placed  in the common carotid artery and secured with a Rumel tourniquet.  Flow  was reestablished to the shunt.  An endarterectomy plane was established  proximally and the plaque was sharply divided.  Eversion endarterectomy  was performed of the external carotid artery.  Distally, there was a  nice taper in the plaque and no tacking sutures were required.  The  artery was irrigated with copious amounts of heparin and dextran and all  loose debris were removed.  The Dacron patch was then sewn using  continuous 6-0 Prolene suture.  Prior to completing the patch closure,  the shunt was removed, the arteries were back-bled and flushed  appropriately, and the anastomosis completed.  Flow was reestablished  first to the external carotid artery and then to the internal carotid  artery.  At the completion, there was good pulse distal to the patch and  a good Doppler signal.  Hemostasis was obtained in the wound.  The wound  was closed with a deep layer of 3-0 Vicryl.  The heparin had been  partially reversed with  protamine.  The platysma was closed with running 3-0 Vicryl.  The skin  was closed with a 4-0 subcuticular stitch.  A sterile dressing was  applied.  The patient tolerated the procedure well and was transferred  to the recovery room in satisfactory condition.  All needle and sponge  counts were correct.      Di Kindle. Edilia Bo, M.D.   Electronically Signed     CSD/MEDQ  D:  02/20/2009  T:  02/21/2009  Job:  [id number]   cc:   Patrica Duel, M.D.

## 2011-02-17 NOTE — H&P (Signed)
HISTORY AND PHYSICAL EXAMINATION   March 19, 2009   Re:  READ, BONELLI               DOB:  1934/11/02   REASON FOR ADMISSION:  Left carotid stenosis.   HISTORY:  This is a 76 year old gentleman who had presented with a  retinal infarct in the right eye and was found to have a moderate right  carotid stenosis.  By duplex he had a moderate 40% right carotid  stenosis with a 60-79% left carotid stenosis.  He underwent a cerebral  arteriogram on 02/19/2009 which showed evidence of a 60% right carotid  stenosis which was symptomatic and an approximately 70-80% left internal  carotid artery stenosis.  There was a bulky plaque in the left carotid  bifurcation with an 80% stenosis distal to this.  He underwent right  carotid endarterectomy for the symptomatic right carotid stenosis on  02/20/2009.  He returns for his first followup visit on 03/19/2009 and  was doing well with no new neurologic symptoms.  He has a persistent  visual field cut on the right.   PAST MEDICAL HISTORY:  Is significant for hypertension, hyperlipidemia.  He denies any history of diabetes, history of previous myocardial  infarction, history of congestive heart failure or history of COPD.   MEDICATIONS:  1. Are aspirin 81 mg p.o. daily.  2. Simvastatin 40 mg p.o. q.h.s.  3. Toprol XL 50 mg p.o. daily.  4. Hydrochlorothiazide 12.5 mg p.o. daily.  5. Multivitamin one p.o. daily.  6. Fish oil p.o. daily.   ALLERGIES:  No known drug allergies.   REVIEW OF SYSTEMS:  GENERAL:  He has had no weight loss, weight gain or  problems with his appetite.  He is 240 pounds.  CARDIAC:  He has had no chest pain, chest pressure, palpitations or  arrhythmias.  PULMONARY:  He has had no productive cough, bronchitis, asthma or  wheezing.  GI:  He has had no recent change in his bowel habits and has no history  of peptic ulcer disease.  GU:  He has had no dysuria or frequency.  VASCULAR:  He has had no  claudication, rest pain or nonhealing ulcers.  NEURO:  He has had no dizziness, blackouts, headaches or seizures.  ORTHO:  He has had no arthritis or joint pain.  PSYCHIATRIC:  He has had no depression or nervousness.  ENT:  He has had no change in his hearing.  HEMATOLOGIC:  He has had no bleeding problems or clotting disorders.   PHYSICAL EXAMINATION:  General:  This is a pleasant 76 year old  gentleman who appears his stated age.  Vital signs:  Blood pressure is  146/77, heart rate is 71.  Neck:  Supple.  He has bilateral carotid  bruits.  Lungs:  Are clear bilaterally to auscultation.  Cardiac:  He  has a regular rate and rhythm.  Abdomen:  Soft and nontender.  I cannot  palpate an aneurysm.  He has normal pitched bowel sounds.  He has  palpable femoral pulses with warm and well-perfused feet.  Neurologic:  Exam is nonfocal.  He does have a persistent right visual field cut.   I have reviewed his cerebral arteriogram which shows a bulky calcific  plaque in the carotid bifurcation on the left.  However, this does not  produce a significant stenosis.  However, above this in the internal  carotid artery there is an approximately 80% focal stenosis.  This is  fairly high  but does appear to be surgically accessible.   Given the retinal infarct on the right now and an 80% asymptomatic  carotid stenosis on the left I have recommended left carotid  endarterectomy in order to lower his risk of future stroke or hopefully  prevent a retinal infarct in the left side.  We have discussed the  indications for surgery and the potential complications including but  not limited to bleeding, stroke (periprocedural risk 1-2%), nerve  injury, MI or other unpredictable medical problems.  He will stop his  Plavix 1 week preoperatively and will not need to continue this  postoperatively.  He will continue his aspirin perioperatively.  His  surgery has been scheduled for 03/28/2009.  All of his questions  were  answered and he is agreeable to proceed.   Di Kindle. Edilia Bo, M.D.  Electronically Signed   CSD/MEDQ  D:  03/19/2009  T:  03/20/2009  Job:  [id number]   cc:   Patrica Duel, M.D.  Vania Rea, M.D.  Timothy R. Vonna Kotyk, MD

## 2011-02-17 NOTE — Procedures (Signed)
CAROTID DUPLEX EXAM   INDICATION:  Carotid disease.   HISTORY:  Diabetes:  No.  Cardiac:  No.  Hypertension:  Yes.  Smoking:  No.  Previous Surgery:  Right carotid endarterectomy on 02/20/2009, left  carotid endarterectomy on 03/28/2009.  CV History:  Currently asymptomatic.  Amaurosis Fugax No, Paresthesias No, Hemiparesis No                                       RIGHT             LEFT  Brachial systolic pressure:         156               154  Brachial Doppler waveforms:         Normal            Normal  Vertebral direction of flow:        Antegrade         Antegrade  DUPLEX VELOCITIES (cm/sec)  CCA peak systolic                   87                97  ECA peak systolic                   172               151  ICA peak systolic                   118               89  ICA end diastolic                   24                23  PLAQUE MORPHOLOGY:                  Heterogeneous     Heterogeneous  PLAQUE AMOUNT:                      Mild              Mild  PLAQUE LOCATION:                    CCA               ICA/CCA   IMPRESSION:  1. Patent bilaterally carotid endarterectomy sites with no bilateral      internal carotid artery stenoses noted.  2. No significant change in Doppler velocities noted when compared to      the previous exam on 10/17/2009.   ___________________________________________  Di Kindle. Edilia Bo, M.D.   CH/MEDQ  D:  08/12/2010  T:  08/12/2010  Job:  [id number]
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
## 2011-02-20 NOTE — H&P (Signed)
NAME:  Jeffrey Ho, AUSBORN NO.:  [id number]   MEDICAL RECORD NO.:  [id number]                  PATIENT TYPE:   LOCATION:                                       FACILITY:   PHYSICIAN:  Dalia Heading, M.D.               DATE OF BIRTH:  Aug 14, 1935   DATE OF ADMISSION:  09/04/2003  DATE OF DISCHARGE:                                HISTORY & PHYSICAL   CHIEF COMPLAINT:  Need for screening colonoscopy.   HISTORY OF PRESENT ILLNESS:  The patient is a 76 year old white male who is  referred for endoscopic evaluation.  He needs a colonoscopy for screening  purposes.  He denies any abdominal complaints.  He denies any melena or  hematochezia.  He had a colonoscopy 10 years ago which was reportedly  unremarkable.  There is no family history of colon carcinoma.  There is no  history of hemorrhoidal disease.   PAST MEDICAL HISTORY:  1. Hypertension.  2. Extrinsic allergies.   PAST SURGICAL HISTORY:  Unremarkable.   CURRENT MEDICATIONS:  Lipitor, Toprol-XL, Allegra.   ALLERGIES:  No known drug allergies.   REVIEW OF SYSTEMS:  Noncontributory.   PHYSICAL EXAMINATION:  GENERAL:  Well-developed, well-nourished white male  in no acute distress.  VITAL SIGNS:  Afebrile, and vital signs are stable.  LUNGS:  Clear to auscultation with equal breath sounds bilaterally.  HEART:  Regular rate and rhythm.  Without S3, S4, or murmurs.  ABDOMEN:  Soft, nontender, nondistended.  No hepatosplenomegaly, masses, or  hernias are identified.  RECTAL:  Deferred until the procedure.   IMPRESSION:  Need for screening colonoscopy.   PLAN:  The patient is scheduled for a colonoscopy on September 04, 2003.  The  risks and benefits of the procedure including bleeding and perforation were  fully explained to the patient, who gave informed consent.     ___________________________________________                                         Dalia Heading, M.D.   MAJ/MEDQ  D:   08/23/2003  T:  08/23/2003  Job:  [id number]   cc:   Patrica Duel, M.D.  [address]  [place]  [place] [address]  Fax: [phone]

## 2011-08-20 ENCOUNTER — Other Ambulatory Visit (INDEPENDENT_AMBULATORY_CARE_PROVIDER_SITE_OTHER): Payer: MEDICARE | Admitting: *Deleted

## 2011-08-20 DIAGNOSIS — I6529 Occlusion and stenosis of unspecified carotid artery: Secondary | ICD-10-CM

## 2011-08-20 DIAGNOSIS — Z48812 Encounter for surgical aftercare following surgery on the circulatory system: Secondary | ICD-10-CM

## 2011-08-31 ENCOUNTER — Encounter: Payer: Self-pay | Admitting: Surgery

## 2011-08-31 NOTE — Procedures (Unsigned)
CAROTID DUPLEX EXAM  INDICATION:  Carotid disease  HISTORY: Diabetes:  No Cardiac:  No Hypertension:  Yes Smoking:  No Previous Surgery:  Right carotid endarterectomy 02/20/2009, left carotid endarterectomy 03/28/2009 CV History:  Asymptomatic Amaurosis Fugax No, Paresthesias No, Hemiparesis No                                      RIGHT             LEFT Brachial systolic pressure:         149               153 Brachial Doppler waveforms:         Normal            Normal Vertebral direction of flow:        Antegrade         Antegrade DUPLEX VELOCITIES (cm/sec) CCA peak systolic                   83                101 ECA peak systolic                   96                92 ICA peak systolic                   102               72 ICA end diastolic                   18                27 PLAQUE MORPHOLOGY:                  Heterogeneous     Heterogeneous PLAQUE AMOUNT:                      Mild              Mild PLAQUE LOCATION:                    CCA               CCA, ICA  IMPRESSION: 1. Patent bilateral carotid endarterectomy sites without evidence of     restenosis of the internal carotid artery. 2. Stable in comparison to the previous exam.  ___________________________________________ V. Charlena Cross, MD  EM/MEDQ  D:  08/21/2011  T:  08/21/2011  Job:  [id number]

## 2012-08-10 ENCOUNTER — Encounter: Payer: Self-pay | Admitting: Surgery

## 2012-08-19 ENCOUNTER — Encounter: Payer: Self-pay | Admitting: Surgery

## 2012-08-19 ENCOUNTER — Other Ambulatory Visit: Payer: Self-pay | Admitting: *Deleted

## 2012-08-19 DIAGNOSIS — Z48812 Encounter for surgical aftercare following surgery on the circulatory system: Secondary | ICD-10-CM

## 2012-08-19 DIAGNOSIS — I6529 Occlusion and stenosis of unspecified carotid artery: Secondary | ICD-10-CM

## 2012-08-22 ENCOUNTER — Ambulatory Visit (INDEPENDENT_AMBULATORY_CARE_PROVIDER_SITE_OTHER): Payer: MEDICARE | Admitting: Surgery

## 2012-08-22 ENCOUNTER — Ambulatory Visit (INDEPENDENT_AMBULATORY_CARE_PROVIDER_SITE_OTHER): Payer: MEDICARE | Admitting: Vascular Surgery

## 2012-08-22 ENCOUNTER — Encounter: Payer: Self-pay | Admitting: Surgery

## 2012-08-22 VITALS — BP 137/75 | HR 52 | Resp 16 | Ht 72.0 in | Wt 240.9 lb

## 2012-08-22 DIAGNOSIS — Z48812 Encounter for surgical aftercare following surgery on the circulatory system: Secondary | ICD-10-CM

## 2012-08-22 DIAGNOSIS — I6529 Occlusion and stenosis of unspecified carotid artery: Secondary | ICD-10-CM

## 2012-08-22 NOTE — Progress Notes (Signed)
[facility] HISTORY AND PHYSICAL   CC:  Follow up carotid duplex scan  Referring Provider:  [name], MD  HPI: This is a 77 y.o. male here for f/u carotid duplex scan.  Denies amaurosis fugax, paresthesias, or hemiparesis.  He has a hx of Right CEA 02/20/09 and left CEA on 03/28/09.  He has done quite well since then.  Past Medical History  Diagnosis Date  . Carotid artery occlusion   . Hyperlipidemia   . Hypertension    Past Surgical History  Procedure Date  . Carotid endarterectomy     left CEA  06/24.2010  . Carotid endarterectomy     right CEA  02/20/2009    No Known Allergies  Current Outpatient Prescriptions  Medication Sig Dispense Refill  . aspirin 81 MG tablet Take 81 mg by mouth daily.      . fish oil-omega-3 fatty acids 1000 MG capsule Take 2 g by mouth daily.      . hydrochlorothiazide (HYDRODIURIL) 25 MG tablet Take 25 mg by mouth daily.      . metoprolol (LOPRESSOR) 50 MG tablet daily.      . metoprolol succinate (TOPROL-XL) 50 MG 24 hr tablet Take 50 mg by mouth daily. Take with or immediately following a meal.      . niacin 500 MG tablet Take 500 mg by mouth daily with breakfast.      . simvastatin (ZOCOR) 20 MG tablet Take 20 mg by mouth every evening.        Family History  Problem Relation Age of Onset  . Heart disease Father     History   Social History  . Marital Status: Married    Spouse Name: N/A    Number of Children: N/A  . Years of Education: N/A   Occupational History  . Not on file.   Social History Main Topics  . Smoking status: Former Smoker    Quit date: 08/10/1969  . Smokeless tobacco: Never Used  . Alcohol Use: No  . Drug Use: No  . Sexually Active: Not on file   Other Topics Concern  . Not on file   Social History Narrative  . No narrative on file     ROS: [x]  Positive   [ ]  Negative   [x ] All sytems reviewed and are negative  Cardiovascular: [] chest pain; [] chest pressure;  [] palpitations; [] SOB lying flat; [] DOE; [] pain in legs with walking; [] pain in legs when lying flat; [] Hx of DVT; [] Hx phlebitis; [] swelling in legs; [] varicose veins Pulmonary: [] productive cough; [] asthma; [] wheezing Neurologic: [] Hx CVA; [] weakness in arms or legs; [] numbness in arms or legs; [] difficulty in speaking or slurred speech; [] temporary loss of vision in one eye; [] dizziness Hematologic:  [] bleeding problems; [] clots easily GI:  [] vomiting blood; []  blood in stool; [] PUD GU: []  Dysuria; [] hematuria Psychiatric:  [] Hx major depression Integumentary:  [] rashes; [] ulcers Constitutional:  [] fever; [] chills   PHYSICAL EXAMINATION:  Filed Vitals:   08/22/12 1231  BP: 137/75  Pulse: 52  Resp:    Body mass index is 32.67 kg/(m^2).  General:  WDWN in NAD Gait: Normal HENT: WNL; well healed bilateral CEA scars; HOH Eyes: PERRL Pulmonary: normal non-labored breathing , without Rales, rhonchi,  wheezing Cardiac: RRR, without  Murmurs, rubs or gallops; No carotid bruits Abdomen: soft, NT, no masses Skin: no rashes, ulcers noted Vascular Exam/Pulses: + palpable radial pulses; no edema in the bilateral extremities. Extremities: without ischemic changes, no Gangrene , no cellulitis;  no open wounds;  Musculoskeletal: no muscle wasting or atrophy  Neurologic: A&O X 3; Appropriate Affect ; SENSATION: normal; MOTOR FUNCTION:  moving all extremities equally. Speech is fluent/normal  Non-Invasive Vascular Imaging: Carotid Duplex Scan:  08/22/2012  1. Right ICA is patent with hx of right CEA 2. Left ICA is patent with hx of CEA 3. Bilateral CCA disease is present and less than 50% with heterogenous plaque evident. 4. B ECA are patent 5. B vertebral arteries are patent and antegrade  ASSESSMENT/PLAN: 77 y.o. male here for f/u carotid duplex scan. He has been asymptomatic without any problems.  We will have him f/u in 1 year with carotid duplex scan with NP. He will calls sooner  if he has any problems before then.   Doreatha Massed, PA-C [facility] [phone]  Clinic MD:   Myra Gianotti    I agree with the above. The patient is status post bilateral carotid endarterectomy by Dr. Edilia Bo. He remained asymptomatic. Duplex ultrasound reveals no evidence of recurrent stenosis. He will follow up in one year with a repeat ultrasound.  Durene Cal

## 2012-08-22 NOTE — Progress Notes (Signed)
Carotid duplex performed @ [facility] 08/22/2012

## 2012-10-18 ENCOUNTER — Other Ambulatory Visit: Payer: Self-pay | Admitting: *Deleted

## 2012-10-18 DIAGNOSIS — I6529 Occlusion and stenosis of unspecified carotid artery: Secondary | ICD-10-CM

## 2013-08-22 ENCOUNTER — Encounter: Payer: Self-pay | Admitting: Family

## 2013-08-23 ENCOUNTER — Encounter (INDEPENDENT_AMBULATORY_CARE_PROVIDER_SITE_OTHER): Payer: Self-pay

## 2013-08-23 ENCOUNTER — Ambulatory Visit: Payer: MEDICARE | Admitting: Family

## 2013-08-23 ENCOUNTER — Ambulatory Visit (HOSPITAL_COMMUNITY)
Admission: RE | Admit: 2013-08-23 | Discharge: 2013-08-23 | Disposition: A | Discharge status: Home / Self Care | Payer: MEDICARE | Source: Ambulatory Visit | Attending: Surgery | Admitting: Surgery

## 2013-08-23 DIAGNOSIS — I6529 Occlusion and stenosis of unspecified carotid artery: Secondary | ICD-10-CM | POA: Insufficient documentation

## 2013-08-23 NOTE — Progress Notes (Signed)
Patient left before being seen.  The following letter was sent to the patient:  09/05/2013  Jeffrey Ho [address] [place] [address] MRN: [id number]  Dear Mr. Biegler,   I have reviewed your Vascular Lab study performed on 08/23/2013. Your vascular status is stable and we recommend no additional evaluation at this time.  I would like for you to have a follow up Vascular Lab study in 1 year along with an appointment with me or our physician extender. Enclosed is next appointment date and time.  Please do not hesitate to call our office if you have any questions or if there are changes in your condition prior to your next scheduled appointment.    Sincerely,    C. Edilia Bo

## 2013-08-23 NOTE — Patient Instructions (Signed)
Stroke Prevention Some medical conditions and behaviors are associated with an increased chance of having a stroke. You may prevent a stroke by making healthy choices and managing medical conditions. Reduce your risk of having a stroke by:  Staying physically active. Get at least 30 minutes of activity on most or all days.  Not smoking. It may also be helpful to avoid exposure to secondhand smoke.  Limiting alcohol use. Moderate alcohol use is considered to be:  No more than 2 drinks per day for men.  No more than 1 drink per day for nonpregnant women.  Eating healthy foods.  Include 5 or more servings of fruits and vegetables a day.  Certain diets may be prescribed to address high blood pressure, high cholesterol, diabetes, or obesity.  Managing your cholesterol levels.  A low-saturated fat, low-trans fat, low-cholesterol, and high-fiber diet may control cholesterol levels.  Take any prescribed medicines to control cholesterol as directed by your caregiver.  Managing your diabetes.  A controlled-carbohydrate, controlled-sugar diet is recommended to manage diabetes.  Take any prescribed medicines to control diabetes as directed by your caregiver.  Controlling your high blood pressure (hypertension).  A low-salt (sodium), low-saturated fat, low-trans fat, and low-cholesterol diet is recommended to manage high blood pressure.  Take any prescribed medicines to control hypertension as directed by your caregiver.  Maintaining a healthy weight.  A reduced-calorie, low-sodium, low-saturated fat, low-trans fat, low-cholesterol diet is recommended to manage weight.  Stopping drug abuse.  Avoiding birth control pills.  Talk to your caregiver about the risks of taking birth control pills if you are over 35 years old, smoke, get migraines, or have ever had a blood clot.  Getting evaluated for sleep disorders (sleep apnea).  Talk to your caregiver about getting a sleep evaluation  if you snore a lot or have excessive sleepiness.  Taking medicines as directed by your caregiver.  For some people, aspirin or blood thinners (anticoagulants) are helpful in reducing the risk of forming abnormal blood clots that can lead to stroke. If you have the irregular heart rhythm of atrial fibrillation, you should be on a blood thinner unless there is a good reason you cannot take them.  Understand all your medicine instructions. SEEK IMMEDIATE MEDICAL CARE IF:   You have sudden weakness or numbness of the face, arm, or leg, especially on one side of the body.  You have sudden confusion.  You have trouble speaking (aphasia) or understanding.  You have sudden trouble seeing in one or both eyes.  You have sudden trouble walking.  You have dizziness.  You have a loss of balance or coordination.  You have a sudden, severe headache with no known cause.  You have new chest pain or an irregular heartbeat. Any of these symptoms may represent a serious problem that is an emergency. Do not wait to see if the symptoms will go away. Get medical help right away. Call your local emergency services (911 in [place] Do not drive yourself to the hospital. Document Released: 10/29/2004 Document Revised: 12/14/2011 Document Reviewed: 03/24/2013 Exit[employer] Patient Information 2014 [employer].  

## 2013-09-04 ENCOUNTER — Other Ambulatory Visit: Payer: Self-pay | Admitting: Surgery

## 2013-09-04 DIAGNOSIS — Z48812 Encounter for surgical aftercare following surgery on the circulatory system: Secondary | ICD-10-CM

## 2013-09-05 ENCOUNTER — Encounter: Payer: Self-pay | Admitting: Vascular Surgery

## 2013-11-22 ENCOUNTER — Other Ambulatory Visit: Payer: Self-pay | Admitting: Orthopedic Surgery

## 2013-11-22 DIAGNOSIS — M48061 Spinal stenosis, lumbar region without neurogenic claudication: Secondary | ICD-10-CM

## 2013-11-23 ENCOUNTER — Ambulatory Visit
Admission: RE | Admit: 2013-11-23 | Discharge: 2013-11-23 | Disposition: A | Discharge status: Home / Self Care | Payer: Medicare Other | Source: Ambulatory Visit | Attending: Orthopedic Surgery | Admitting: Orthopedic Surgery

## 2013-11-23 DIAGNOSIS — M48061 Spinal stenosis, lumbar region without neurogenic claudication: Secondary | ICD-10-CM

## 2013-11-23 MED ORDER — IOHEXOL 180 MG/ML  SOLN
17.0000 mL | Freq: Once | INTRAMUSCULAR | Status: AC | PRN
  Administered 2013-11-23: 17 mL via INTRATHECAL

## 2013-11-23 NOTE — Discharge Instructions (Signed)

## 2013-11-23 NOTE — Progress Notes (Signed)
Pt is very hard of hearing, discharge instructions explained to pt. JKL RN

## 2014-08-05 HISTORY — PX: EYE SURGERY: SHX253

## 2014-08-24 ENCOUNTER — Encounter: Payer: Self-pay | Admitting: Family

## 2014-08-27 ENCOUNTER — Encounter: Payer: Self-pay | Admitting: Family

## 2014-08-27 ENCOUNTER — Ambulatory Visit (INDEPENDENT_AMBULATORY_CARE_PROVIDER_SITE_OTHER): Payer: Medicare Other | Admitting: Family

## 2014-08-27 ENCOUNTER — Ambulatory Visit (HOSPITAL_COMMUNITY)
Admission: RE | Admit: 2014-08-27 | Discharge: 2014-08-27 | Disposition: A | Discharge status: Home / Self Care | Payer: Medicare Other | Source: Ambulatory Visit | Attending: Family | Admitting: Family

## 2014-08-27 VITALS — BP 134/78 | HR 51 | Resp 14 | Ht 72.0 in | Wt 214.0 lb

## 2014-08-27 DIAGNOSIS — Z48812 Encounter for surgical aftercare following surgery on the circulatory system: Secondary | ICD-10-CM

## 2014-08-27 DIAGNOSIS — Z9889 Other specified postprocedural states: Secondary | ICD-10-CM

## 2014-08-27 DIAGNOSIS — I6523 Occlusion and stenosis of bilateral carotid arteries: Secondary | ICD-10-CM

## 2014-08-27 NOTE — Patient Instructions (Signed)
Stroke Prevention Some medical conditions and behaviors are associated with an increased chance of having a stroke. You may prevent a stroke by making healthy choices and managing medical conditions. HOW CAN I REDUCE MY RISK OF HAVING A STROKE?   Stay physically active. Get at least 30 minutes of activity on most or all days.  Do not smoke. It may also be helpful to avoid exposure to secondhand smoke.  Limit alcohol use. Moderate alcohol use is considered to be:  No more than 2 drinks per day for men.  No more than 1 drink per day for nonpregnant women.  Eat healthy foods. This involves:  Eating 5 or more servings of fruits and vegetables a day.  Making dietary changes that address high blood pressure (hypertension), high cholesterol, diabetes, or obesity.  Manage your cholesterol levels.  Making food choices that are high in fiber and low in saturated fat, trans fat, and cholesterol may control cholesterol levels.  Take any prescribed medicines to control cholesterol as directed by your health care provider.  Manage your diabetes.  Controlling your carbohydrate and sugar intake is recommended to manage diabetes.  Take any prescribed medicines to control diabetes as directed by your health care provider.  Control your hypertension.  Making food choices that are low in salt (sodium), saturated fat, trans fat, and cholesterol is recommended to manage hypertension.  Take any prescribed medicines to control hypertension as directed by your health care provider.  Maintain a healthy weight.  Reducing calorie intake and making food choices that are low in sodium, saturated fat, trans fat, and cholesterol are recommended to manage weight.  Stop drug abuse.  Avoid taking birth control pills.  Talk to your health care provider about the risks of taking birth control pills if you are over 35 years old, smoke, get migraines, or have ever had a blood clot.  Get evaluated for sleep  disorders (sleep apnea).  Talk to your health care provider about getting a sleep evaluation if you snore a lot or have excessive sleepiness.  Take medicines only as directed by your health care provider.  For some people, aspirin or blood thinners (anticoagulants) are helpful in reducing the risk of forming abnormal blood clots that can lead to stroke. If you have the irregular heart rhythm of atrial fibrillation, you should be on a blood thinner unless there is a good reason you cannot take them.  Understand all your medicine instructions.  Make sure that other conditions (such as anemia or atherosclerosis) are addressed. SEEK IMMEDIATE MEDICAL CARE IF:   You have sudden weakness or numbness of the face, arm, or leg, especially on one side of the body.  Your face or eyelid droops to one side.  You have sudden confusion.  You have trouble speaking (aphasia) or understanding.  You have sudden trouble seeing in one or both eyes.  You have sudden trouble walking.  You have dizziness.  You have a loss of balance or coordination.  You have a sudden, severe headache with no known cause.  You have new chest pain or an irregular heartbeat. Any of these symptoms may represent a serious problem that is an emergency. Do not wait to see if the symptoms will go away. Get medical help at once. Call your local emergency services (911 in [place] Do not drive yourself to the hospital. Document Released: 10/29/2004 Document Revised: 02/05/2014 Document Reviewed: 03/24/2013 Exit[employer] Patient Information 2015 [employer]. This information is not intended to replace advice given   to you by your health care provider. Make sure you discuss any questions you have with your health care provider.  

## 2014-08-27 NOTE — Progress Notes (Signed)
Established Carotid Patient   History of Present Illness  Jeffrey Ho is a 79 y.o. male patient of Dr. Myra GianottiBrabham who is s/p right CEA 02/20/09 and left CEA on 03/28/09. He lost vision in his right eye right before the right CEA, this may have been determined to be a stroke, he has since had no other stroke or TIA symptoms. Specifically the patient denies a history of unilateral  of facial drooping, denies a history of hemiplegia, and denies a history of receptive or expressive aphasia.   The pt denies claudication symptoms with walking. He had hearing loss since childhood, wife sates this has worsened.  The patient reports New Medical or Surgical History: left cataract extraction.  Pt Diabetic: No Pt smoker: former smoker, quit in the 1960's  Pt meds include: Statin : stopped the statin as he states he had myalgias, and states his PCP is aware ASA: Yes Other anticoagulants/antiplatelets: no   Past Medical History  Diagnosis Date  . Carotid artery occlusion   . Hyperlipidemia   . Hypertension     Social History History  Substance Use Topics  . Smoking status: Former Smoker    Quit date: 08/10/1969  . Smokeless tobacco: Never Used  . Alcohol Use: No    Family History Family History  Problem Relation Age of Onset  . Heart disease Father     Before age 79    Surgical History Past Surgical History  Procedure Laterality Date  . Carotid endarterectomy      left CEA  06/24.2010  . Carotid endarterectomy      right CEA  02/20/2009  . Eye surgery Left Nov. 2015    Cataract    No Known Allergies  Current Outpatient Prescriptions  Medication Sig Dispense Refill  . aspirin 81 MG tablet Take 81 mg by mouth daily.    . fish oil-omega-3 fatty acids 1000 MG capsule Take 2 g by mouth daily.    . hydrochlorothiazide (HYDRODIURIL) 25 MG tablet Take 25 mg by mouth daily.    . metoprolol (LOPRESSOR) 50 MG tablet daily.    . niacin 500 MG tablet Take 500 mg by mouth daily  with breakfast.    . metoprolol succinate (TOPROL-XL) 50 MG 24 hr tablet Take 50 mg by mouth daily. Take with or immediately following a meal.    . simvastatin (ZOCOR) 20 MG tablet Take 20 mg by mouth every evening.     No current facility-administered medications for this visit.    Review of Systems : See HPI for pertinent positives and negatives.  Physical Examination  Filed Vitals:   08/27/14 1202 08/27/14 1205  BP: 145/78 134/78  Pulse: 54 51  Resp:  14  Height:  6' (1.829 m)  Weight:  214 lb (97.07 kg)  SpO2:  100%   Body mass index is 29.02 kg/(m^2).  General: WDWN male in NAD GAIT: normal Eyes: Left pupil reacts to light. Pulmonary:  Non-labored, CTAB, Negative  Rales, Negative rhonchi, & Negative wheezing.  Cardiac: regular Rhythm, no detected murmur.  VASCULAR EXAM Carotid Bruits Right Left   Negative Negative    Radial pulses are 2+ palpable and equal.  LE Pulses Right Left       POPLITEAL  not palpable   not palpable       POSTERIOR TIBIAL   palpable    palpable        DORSALIS PEDIS      ANTERIOR TIBIAL not palpable  not palpable     Gastrointestinal: soft, nontender, BS WNL, no r/g .  Musculoskeletal: Negative muscle atrophy/wasting. M/S 5/5 throughout, Extremities without ischemic changes.  Neurologic: A&O X 3; Appropriate Affect ; SENSATION ;normal;  Speech is normal CN 2-12 intact except hard of hearing, Pain and light touch intact in extremities, Motor exam as listed above.   Non-Invasive Vascular Imaging CAROTID DUPLEX 08/27/2014   CEREBROVASCULAR DUPLEX EVALUATION    INDICATION: Carotid artery disease    PREVIOUS INTERVENTION(S): Right carotid endarterectomy 02/20/2009; Left carotid endarterectomy 03/28/2009.    DUPLEX EXAM: Carotid duplex    RIGHT  LEFT  Peak Systolic Velocities (cm/s) End Diastolic Velocities  (cm/s) Plaque LOCATION Peak Systolic Velocities (cm/s) End Diastolic Velocities (cm/s) Plaque  95 19 HT CCA PROXIMAL 124 31 HT  77 16 HT CCA MID 119 31 HT  94 20 HT CCA DISTAL 110 26 -  96 11 - ECA 155 27 HT  89 18 - ICA PROXIMAL 49 13 -  63 19 - ICA MID 62 22 -  86 30 - ICA DISTAL 80 23 -    N/A ICA / CCA Ratio (PSV) N/A  Antegrade Vertebral Flow Antegrade  140 Brachial Systolic Pressure (mmHg) 130  Triphasic Brachial Artery Waveforms Triphasic    Plaque Morphology:  HM = Homogeneous, HT = Heterogeneous, CP = Calcific Plaque, SP = Smooth Plaque, IP = Irregular Plaque     ADDITIONAL FINDINGS:     IMPRESSION: 1. Patent bilateral carotid endarterectomy sites with no evidence for restenosis.    Compared to the previous exam:  No significant change       Assessment: Jeffrey Ho is a 79 y.o. male who is s/p right CEA 02/20/09 and left CEA on 03/28/09. He presents with asymptomatic patent bilateral carotid endarterectomy sites with no evidence for restenosis. The  ICA stenosis is  Unchanged from previous exam.  Plan: Follow-up in 1 year with Carotid Duplex.   I discussed in depth with the patient the nature of atherosclerosis, and emphasized the importance of maximal medical management including strict control of blood pressure, blood glucose, and lipid levels, obtaining regular exercise, and continued cessation of smoking.  The patient is aware that without maximal medical management the underlying atherosclerotic disease process will progress, limiting the benefit of any interventions. The patient was given information about stroke prevention and what symptoms should prompt the patient to seek immediate medical care. Thank you for allowing us to participate in this patient's care.  Charisse MarchSuzanne Talani Brazee, RN, MSN, FNP-C [facility] [place] Office: [phone]  Clinic Physician: Myra GianottiBrabham  08/27/2014 12:20 PM

## 2014-08-27 NOTE — Addendum Note (Signed)
Addended by: Sharee PimpleMCCHESNEY, MARILYN K on: 08/27/2014 03:37 PM   Modules accepted: Orders

## 2015-04-01 ENCOUNTER — Other Ambulatory Visit: Payer: Self-pay

## 2015-08-27 ENCOUNTER — Encounter: Payer: Self-pay | Admitting: Family

## 2015-09-02 ENCOUNTER — Ambulatory Visit (HOSPITAL_COMMUNITY)
Admission: RE | Admit: 2015-09-02 | Discharge: 2015-09-02 | Disposition: A | Discharge status: Home / Self Care | Payer: Medicare Other | Source: Ambulatory Visit | Attending: Family | Admitting: Family

## 2015-09-02 ENCOUNTER — Encounter: Payer: Self-pay | Admitting: Family

## 2015-09-02 ENCOUNTER — Ambulatory Visit (INDEPENDENT_AMBULATORY_CARE_PROVIDER_SITE_OTHER): Payer: Medicare Other | Admitting: Family

## 2015-09-02 VITALS — BP 139/84 | HR 57 | Temp 97.7°F | Resp 14 | Ht 72.0 in | Wt 226.0 lb

## 2015-09-02 DIAGNOSIS — Z48812 Encounter for surgical aftercare following surgery on the circulatory system: Secondary | ICD-10-CM | POA: Diagnosis present

## 2015-09-02 DIAGNOSIS — I10 Essential (primary) hypertension: Secondary | ICD-10-CM | POA: Diagnosis not present

## 2015-09-02 DIAGNOSIS — Z9889 Other specified postprocedural states: Secondary | ICD-10-CM | POA: Diagnosis not present

## 2015-09-02 DIAGNOSIS — E785 Hyperlipidemia, unspecified: Secondary | ICD-10-CM | POA: Insufficient documentation

## 2015-09-02 NOTE — Progress Notes (Signed)
Filed Vitals:   09/02/15 1129 09/02/15 1132 09/02/15 1137  BP: 142/82 131/79 139/84  Pulse: 52 52 57  Temp:  97.7 F (36.5 C)   TempSrc:  Oral   Resp:  14   Height:  6' (1.829 m)   Weight:  226 lb (102.513 kg)   SpO2:  100%

## 2015-09-02 NOTE — Addendum Note (Signed)
Addended by: Adria DillELDRIDGE-LEWIS, Gwendolyn Nishi L on: 09/02/2015 01:28 PM   Modules accepted: Orders

## 2015-09-02 NOTE — Patient Instructions (Signed)
Stroke Prevention Some medical conditions and behaviors are associated with an increased chance of having a stroke. You may prevent a stroke by making healthy choices and managing medical conditions. HOW CAN I REDUCE MY RISK OF HAVING A STROKE?   Stay physically active. Get at least 30 minutes of activity on most or all days.  Do not smoke. It may also be helpful to avoid exposure to secondhand smoke.  Limit alcohol use. Moderate alcohol use is considered to be:  No more than 2 drinks per day for men.  No more than 1 drink per day for nonpregnant women.  Eat healthy foods. This involves:  Eating 5 or more servings of fruits and vegetables a day.  Making dietary changes that address high blood pressure (hypertension), high cholesterol, diabetes, or obesity.  Manage your cholesterol levels.  Making food choices that are high in fiber and low in saturated fat, trans fat, and cholesterol may control cholesterol levels.  Take any prescribed medicines to control cholesterol as directed by your health care provider.  Manage your diabetes.  Controlling your carbohydrate and sugar intake is recommended to manage diabetes.  Take any prescribed medicines to control diabetes as directed by your health care provider.  Control your hypertension.  Making food choices that are low in salt (sodium), saturated fat, trans fat, and cholesterol is recommended to manage hypertension.  Ask your health care provider if you need treatment to lower your blood pressure. Take any prescribed medicines to control hypertension as directed by your health care provider.  If you are 18-39 years of age, have your blood pressure checked every 3-5 years. If you are 40 years of age or older, have your blood pressure checked every year.  Maintain a healthy weight.  Reducing calorie intake and making food choices that are low in sodium, saturated fat, trans fat, and cholesterol are recommended to manage  weight.  Stop drug abuse.  Avoid taking birth control pills.  Talk to your health care provider about the risks of taking birth control pills if you are over 35 years old, smoke, get migraines, or have ever had a blood clot.  Get evaluated for sleep disorders (sleep apnea).  Talk to your health care provider about getting a sleep evaluation if you snore a lot or have excessive sleepiness.  Take medicines only as directed by your health care provider.  For some people, aspirin or blood thinners (anticoagulants) are helpful in reducing the risk of forming abnormal blood clots that can lead to stroke. If you have the irregular heart rhythm of atrial fibrillation, you should be on a blood thinner unless there is a good reason you cannot take them.  Understand all your medicine instructions.  Make sure that other conditions (such as anemia or atherosclerosis) are addressed. SEEK IMMEDIATE MEDICAL CARE IF:   You have sudden weakness or numbness of the face, arm, or leg, especially on one side of the body.  Your face or eyelid droops to one side.  You have sudden confusion.  You have trouble speaking (aphasia) or understanding.  You have sudden trouble seeing in one or both eyes.  You have sudden trouble walking.  You have dizziness.  You have a loss of balance or coordination.  You have a sudden, severe headache with no known cause.  You have new chest pain or an irregular heartbeat. Any of these symptoms may represent a serious problem that is an emergency. Do not wait to see if the symptoms will   go away. Get medical help at once. Call your local emergency services (911 in [place] Do not drive yourself to the hospital.   This information is not intended to replace advice given to you by your health care provider. Make sure you discuss any questions you have with your health care provider.   Document Released: 10/29/2004 Document Revised: 10/12/2014 Document Reviewed:  03/24/2013 Elsevier Interactive Patient Education 2016 [employer]  

## 2015-09-02 NOTE — Progress Notes (Signed)
Established Carotid Patient   History of Present Illness  Jeffrey Ho E Kulak is a 80 y.o. male patient of Dr. Myra GianottiBrabham who is s/p right CEA 02/20/09 and left CEA on 03/28/09. He lost vision in his right eye right before the right CEA, this may have been determined to be a stroke, he has since had no other stroke or TIA symptoms. Specifically the patient denies a history of unilateral of facial drooping, denies a history of hemiplegia, and denies a history of receptive or expressive aphasia.  The pt denies claudication symptoms with walking. He had hearing loss since childhood, wife sates this has worsened.  The patient reports New Medical or Surgical History: none. He states his blood pressure at home is about 120/70; states his blood pressure increases in a medical setting.   Pt Diabetic: No Pt smoker: former smoker, quit in the 1960's  Pt meds include: Statin: yes ASA: Yes Other anticoagulants/antiplatelets: no    Past Medical History  Diagnosis Date  . Carotid artery occlusion   . Hyperlipidemia   . Hypertension     Social History Social History  Substance Use Topics  . Smoking status: Former Smoker    Quit date: 08/10/1969  . Smokeless tobacco: Never Used  . Alcohol Use: No    Family History Family History  Problem Relation Age of Onset  . Heart disease Father     Before age 78    Surgical History Past Surgical History  Procedure Laterality Date  . Carotid endarterectomy      left CEA  06/24.2010  . Carotid endarterectomy      right CEA  02/20/2009  . Eye surgery Left Nov. 2015    Cataract    No Known Allergies  Current Outpatient Prescriptions  Medication Sig Dispense Refill  . aspirin 81 MG tablet Take 81 mg by mouth daily.    . fish oil-omega-3 fatty acids 1000 MG capsule Take 2 g by mouth daily.    . hydrochlorothiazide (HYDRODIURIL) 25 MG tablet Take 25 mg by mouth daily.    . metoprolol (LOPRESSOR) 50 MG tablet daily.    . metoprolol succinate  (TOPROL-XL) 50 MG 24 hr tablet Take 50 mg by mouth daily. Take with or immediately following a meal.    . niacin 500 MG tablet Take 500 mg by mouth daily with breakfast.    . simvastatin (ZOCOR) 20 MG tablet Take 20 mg by mouth every evening.     No current facility-administered medications for this visit.    Review of Systems : See HPI for pertinent positives and negatives.  Physical Examination  Filed Vitals:   09/02/15 1129 09/02/15 1132 09/02/15 1137  BP: 142/82 131/79 139/84  Pulse: 52 52 57  Temp:  97.7 F (36.5 C)   TempSrc:  Oral   Resp:  14   Height:  6' (1.829 m)   Weight:  226 lb (102.513 kg)   SpO2:  100%    Body mass index is 30.64 kg/(m^2).   General: WDWN male in NAD GAIT: normal Eyes: Left pupil reacts to light. Pulmonary: Non-labored, CTAB, no ales,  rhonchi, or wheezing.  Cardiac: regular rhythm, no detected murmur.  VASCULAR EXAM Carotid Bruits Right Left   Negative Negative   Radial pulses are 2+ palpable and equal.      LE Pulses Right Left   POPLITEAL not palpable  not palpable   POSTERIOR TIBIAL  palpable   palpable    DORSALIS PEDIS  ANTERIOR TIBIAL not  palpable  not palpable     Gastrointestinal: soft, nontender, BS WNL, no r/g .  Musculoskeletal: No muscle atrophy/wasting. M/S 5/5 throughout, Extremities without ischemic changes.  Neurologic: A&O X 3; appropriate affect, normal sensation, Speech is normal CN 2-12 intact except hard of hearing, Pain and light touch intact in extremities, Motor exam as listed above.           Non-Invasive Vascular Imaging CAROTID DUPLEX 09/02/2015   Right ICA: widely patent CEA site with no evidence of restenosis. Left ICA: widely patent CEA site with no evidence of restenosis.  Bilateral vertebral artery is  antegrade. No significant change compared to prior exam.    Assessment: Jeffrey Ho is a 80 y.o. male who is s/p right CEA 02/20/09 and left CEA on 03/28/09.  He lost vision in his right eye right before the right CEA, this may have been determined to be a stroke, he has since had no other stroke or TIA symptoms.  Both CEA sites remain patent ith no evidence of restenosis. No significant change compared to prior exam.     Plan: Follow-up in 1 year with Carotid Duplex scan.    I discussed in depth with the patient the nature of atherosclerosis, and emphasized the importance of maximal medical management including strict control of blood pressure, blood glucose, and lipid levels, obtaining regular exercise, and continued cessation of smoking.  The patient is aware that without maximal medical management the underlying atherosclerotic disease process will progress, limiting the benefit of any interventions. The patient was given information about stroke prevention and what symptoms should prompt the patient to seek immediate medical care. Thank you for allowing [place] to participate in this patient's care.  Charisse March, RN, MSN, FNP-C [facility] Office: [phone]  Clinic Physician: Myra Gianotti  09/02/2015 11:31 AM

## 2016-03-16 DIAGNOSIS — E782 Mixed hyperlipidemia: Secondary | ICD-10-CM | POA: Diagnosis not present

## 2016-03-16 DIAGNOSIS — M5432 Sciatica, left side: Secondary | ICD-10-CM | POA: Diagnosis not present

## 2016-03-16 DIAGNOSIS — I10 Essential (primary) hypertension: Secondary | ICD-10-CM | POA: Diagnosis not present

## 2016-03-16 DIAGNOSIS — Z6831 Body mass index (BMI) 31.0-31.9, adult: Secondary | ICD-10-CM | POA: Diagnosis not present

## 2016-03-16 DIAGNOSIS — Z1389 Encounter for screening for other disorder: Secondary | ICD-10-CM | POA: Diagnosis not present

## 2016-03-16 DIAGNOSIS — B353 Tinea pedis: Secondary | ICD-10-CM | POA: Diagnosis not present

## 2016-05-07 DIAGNOSIS — M13811 Other specified arthritis, right shoulder: Secondary | ICD-10-CM | POA: Diagnosis not present

## 2016-05-07 DIAGNOSIS — Z6831 Body mass index (BMI) 31.0-31.9, adult: Secondary | ICD-10-CM | POA: Diagnosis not present

## 2016-06-10 DIAGNOSIS — L821 Other seborrheic keratosis: Secondary | ICD-10-CM | POA: Diagnosis not present

## 2016-06-10 DIAGNOSIS — L0100 Impetigo, unspecified: Secondary | ICD-10-CM | POA: Diagnosis not present

## 2016-06-10 DIAGNOSIS — L308 Other specified dermatitis: Secondary | ICD-10-CM | POA: Diagnosis not present

## 2016-06-10 DIAGNOSIS — D225 Melanocytic nevi of trunk: Secondary | ICD-10-CM | POA: Diagnosis not present

## 2016-07-02 DIAGNOSIS — E669 Obesity, unspecified: Secondary | ICD-10-CM | POA: Diagnosis not present

## 2016-07-02 DIAGNOSIS — H9190 Unspecified hearing loss, unspecified ear: Secondary | ICD-10-CM | POA: Diagnosis not present

## 2016-07-02 DIAGNOSIS — H5441 Blindness, right eye, normal vision left eye: Secondary | ICD-10-CM | POA: Diagnosis not present

## 2016-07-02 DIAGNOSIS — E782 Mixed hyperlipidemia: Secondary | ICD-10-CM | POA: Diagnosis not present

## 2016-07-02 DIAGNOSIS — Z23 Encounter for immunization: Secondary | ICD-10-CM | POA: Diagnosis not present

## 2016-07-02 DIAGNOSIS — I10 Essential (primary) hypertension: Secondary | ICD-10-CM | POA: Diagnosis not present

## 2016-07-02 DIAGNOSIS — E6609 Other obesity due to excess calories: Secondary | ICD-10-CM | POA: Diagnosis not present

## 2016-07-02 DIAGNOSIS — Z0001 Encounter for general adult medical examination with abnormal findings: Secondary | ICD-10-CM | POA: Diagnosis not present

## 2016-08-08 ENCOUNTER — Encounter (HOSPITAL_COMMUNITY): Payer: Self-pay | Admitting: Emergency Medicine

## 2016-08-08 ENCOUNTER — Emergency Department (HOSPITAL_COMMUNITY)
Admission: EM | Admit: 2016-08-08 | Discharge: 2016-08-08 | Disposition: A | Discharge status: Home / Self Care | Payer: Medicare Other | Attending: Emergency Medicine | Admitting: Emergency Medicine

## 2016-08-08 DIAGNOSIS — Z87891 Personal history of nicotine dependence: Secondary | ICD-10-CM | POA: Insufficient documentation

## 2016-08-08 DIAGNOSIS — I10 Essential (primary) hypertension: Secondary | ICD-10-CM | POA: Diagnosis not present

## 2016-08-08 DIAGNOSIS — Z79899 Other long term (current) drug therapy: Secondary | ICD-10-CM | POA: Insufficient documentation

## 2016-08-08 DIAGNOSIS — N390 Urinary tract infection, site not specified: Secondary | ICD-10-CM | POA: Insufficient documentation

## 2016-08-08 DIAGNOSIS — Z7982 Long term (current) use of aspirin: Secondary | ICD-10-CM | POA: Insufficient documentation

## 2016-08-08 DIAGNOSIS — R531 Weakness: Secondary | ICD-10-CM | POA: Diagnosis not present

## 2016-08-08 DIAGNOSIS — R42 Dizziness and giddiness: Secondary | ICD-10-CM | POA: Diagnosis not present

## 2016-08-08 HISTORY — DX: Blindness, one eye, unspecified eye: H54.40

## 2016-08-08 HISTORY — DX: Unspecified hearing loss, unspecified ear: H91.90

## 2016-08-08 LAB — CBC WITH DIFFERENTIAL/PLATELET
Basophils Absolute: 0.1 10*3/uL (ref 0.0–0.1)
Basophils Relative: 1 %
EOS ABS: 0.7 10*3/uL (ref 0.0–0.7)
EOS PCT: 6 %
HCT: 37.8 % — ABNORMAL LOW (ref 39.0–52.0)
Hemoglobin: 13.6 g/dL (ref 13.0–17.0)
LYMPHS ABS: 2.2 10*3/uL (ref 0.7–4.0)
Lymphocytes Relative: 19 %
MCH: 33.3 pg (ref 26.0–34.0)
MCHC: 36 g/dL (ref 30.0–36.0)
MCV: 92.6 fL (ref 78.0–100.0)
MONO ABS: 1.2 10*3/uL — AB (ref 0.1–1.0)
Monocytes Relative: 10 %
Neutro Abs: 7.5 10*3/uL (ref 1.7–7.7)
Neutrophils Relative %: 64 %
PLATELETS: 258 10*3/uL (ref 150–400)
RBC: 4.08 MIL/uL — ABNORMAL LOW (ref 4.22–5.81)
RDW: 12.6 % (ref 11.5–15.5)
WBC: 11.6 10*3/uL — ABNORMAL HIGH (ref 4.0–10.5)

## 2016-08-08 LAB — URINALYSIS, ROUTINE W REFLEX MICROSCOPIC
BILIRUBIN URINE: NEGATIVE
Glucose, UA: NEGATIVE mg/dL
KETONES UR: NEGATIVE mg/dL
NITRITE: POSITIVE — AB
Protein, ur: NEGATIVE mg/dL
Specific Gravity, Urine: 1.015 (ref 1.005–1.030)
pH: 6 (ref 5.0–8.0)

## 2016-08-08 LAB — COMPREHENSIVE METABOLIC PANEL
ALT: 24 U/L (ref 17–63)
ANION GAP: 8 (ref 5–15)
AST: 24 U/L (ref 15–41)
Albumin: 4 g/dL (ref 3.5–5.0)
Alkaline Phosphatase: 84 U/L (ref 38–126)
BUN: 16 mg/dL (ref 6–20)
CALCIUM: 9.4 mg/dL (ref 8.9–10.3)
CO2: 26 mmol/L (ref 22–32)
Chloride: 102 mmol/L (ref 101–111)
Creatinine, Ser: 0.97 mg/dL (ref 0.61–1.24)
GFR calc Af Amer: >60 mL/min (ref >60)
GFR calc non Af Amer: >60 mL/min (ref >60)
Glucose, Bld: 101 mg/dL — ABNORMAL HIGH (ref 65–99)
Potassium: 3.6 mmol/L (ref 3.5–5.1)
SODIUM: 136 mmol/L (ref 135–145)
Total Bilirubin: 0.5 mg/dL (ref 0.3–1.2)
Total Protein: 7.9 g/dL (ref 6.5–8.1)

## 2016-08-08 LAB — URINE MICROSCOPIC-ADD ON

## 2016-08-08 MED ORDER — SODIUM CHLORIDE 0.9 % IV BOLUS (SEPSIS)
1000.0000 mL | Freq: Once | INTRAVENOUS | Status: AC
  Administered 2016-08-08: 1000 mL via INTRAVENOUS

## 2016-08-08 MED ORDER — CEPHALEXIN 500 MG PO CAPS: 500.0000 mg | ORAL_CAPSULE | Freq: Four times a day (QID) | ORAL | 0 refills | Status: DC

## 2016-08-08 NOTE — ED Provider Notes (Signed)
[facility]-EMERGENCY DEPT Provider Note   CSN: [id number] Arrival date & time: 08/08/16  1114  By signing my name below, I, Emmanuella Mensah, attest that this documentation has been prepared under the direction and in the presence of Bethann BerkshireJoseph Sumi Lye, MD. Electronically Signed: Angelene GiovanniEmmanuella Mensah, ED Scribe. 08/08/16. 1:33 PM.   History   Chief Complaint Chief Complaint  Patient presents with  . Weakness    HPI Comments: Jeffrey Ho is a 81 y.o. male with a hx of hypertension and HLD who presents to the Emergency Department complaining of gradual onset, persistent moderate generalized weakness onset 2-3 days ago. He reports associated BP of 150/89, multiple episodes of dizziness, and foul urine odor. He adds that he believes he may have a UTI. No alleviating factors noted. Pt has not tried any medications PTA. He has NKDA. He denies any fever, chills, dysuria, abdominal pain, nausea, vomiting, or any other symptoms.  The history is provided by the patient. No language interpreter was used.  Weakness  Primary symptoms include dizziness. This is a new problem. The problem has not changed since onset.There was no focality noted. There has been no fever. Pertinent negatives include no chest pain, no vomiting and no headaches. There were no medications administered prior to arrival. Associated medical issues do not include trauma.    Past Medical History:  Diagnosis Date  . Blindness of right eye   . Carotid artery occlusion   . HOH (hard of hearing)   . Hyperlipidemia   . Hypertension     Patient Active Problem List   Diagnosis Date Noted  . Occlusion and stenosis of carotid artery without mention of cerebral infarction 08/22/2012    Past Surgical History:  Procedure Laterality Date  . CAROTID ENDARTERECTOMY     left CEA  06/24.2010  . CAROTID ENDARTERECTOMY     right CEA  02/20/2009  . EYE SURGERY Left Nov. 2015   Cataract       Home Medications    Prior to Admission  medications   Medication Sig Start Date End Date Taking? Authorizing Provider  aspirin 81 MG tablet Take 81 mg by mouth daily.    Historical Provider, MD  fish oil-omega-3 fatty acids 1000 MG capsule Take 2 g by mouth daily.    Historical Provider, MD  hydrochlorothiazide (HYDRODIURIL) 25 MG tablet Take 25 mg by mouth daily.    Historical Provider, MD  metoprolol (LOPRESSOR) 50 MG tablet daily. 08/08/12   Historical Provider, MD  metoprolol succinate (TOPROL-XL) 50 MG 24 hr tablet Take 50 mg by mouth daily. Take with or immediately following a meal.    Historical Provider, MD  niacin 500 MG tablet Take 500 mg by mouth daily with breakfast.    Historical Provider, MD  simvastatin (ZOCOR) 20 MG tablet Take 20 mg by mouth every evening.    Historical Provider, MD    Family History Family History  Problem Relation Age of Onset  . Heart disease Father     Before age 79    Social History Social History  Substance Use Topics  . Smoking status: Former Smoker    Quit date: 08/10/1969  . Smokeless tobacco: Never Used  . Alcohol use No     Allergies   Review of patient's allergies indicates no known allergies.   Review of Systems Review of Systems  Constitutional: Negative for appetite change, chills, fatigue and fever.  HENT: Negative for congestion, ear discharge and sinus pressure.   Eyes: Negative for discharge.  Respiratory: Negative for cough.   Cardiovascular: Negative for chest pain.  Gastrointestinal: Negative for abdominal pain, diarrhea, nausea and vomiting.  Genitourinary: Negative for dysuria, frequency and hematuria.  Musculoskeletal: Negative for back pain.  Skin: Negative for rash.  Neurological: Positive for dizziness and weakness. Negative for seizures and headaches.  Psychiatric/Behavioral: Negative for hallucinations.     Physical Exam Updated Vital Signs BP 162/75 (BP Location: Left Arm)   Pulse 80   Temp 97.5 F (36.4 C) (Oral)   Resp 18   Ht 6' (1.829  m)   Wt 225 lb (102.1 kg)   SpO2 100%   BMI 30.52 kg/m   Physical Exam  Constitutional: He is oriented to person, place, and time. He appears well-developed.  HENT:  Head: Normocephalic.  Eyes: Conjunctivae and EOM are normal. No scleral icterus.  Neck: Neck supple. No thyromegaly present.  Cardiovascular: Normal rate and regular rhythm.  Exam reveals no gallop and no friction rub.   No murmur heard. Pulmonary/Chest: No stridor. He has no wheezes. He has no rales. He exhibits no tenderness.  Abdominal: He exhibits no distension. There is no tenderness. There is no rebound.  Musculoskeletal: Normal range of motion. He exhibits no edema.  Lymphadenopathy:    He has no cervical adenopathy.  Neurological: He is oriented to person, place, and time. He exhibits normal muscle tone. Coordination normal.  Skin: No rash noted. No erythema.  Psychiatric: He has a normal mood and affect. His behavior is normal.     ED Treatments / Results  DIAGNOSTIC STUDIES: Oxygen Saturation is 100% on RA, normal by my interpretation.    COORDINATION OF CARE: 1:23 PM- Pt advised of plan for treatment and pt agrees. Pt informed of his UA. He will receive CMP and CBC for further evaluation. He will also receive IV fluids.    Labs (all labs ordered are listed, but only abnormal results are displayed) Labs Reviewed  URINALYSIS, ROUTINE W REFLEX MICROSCOPIC (NOT AT [facility]) - Abnormal; Notable for the following:       Result Value   APPearance HAZY (*)    Hgb urine dipstick SMALL (*)    Nitrite POSITIVE (*)    Leukocytes, UA LARGE (*)    All other components within normal limits  URINE MICROSCOPIC-ADD ON - Abnormal; Notable for the following:    Squamous Epithelial / LPF 0-5 (*)    Bacteria, UA MANY (*)    All other components within normal limits    EKG  EKG Interpretation None       Radiology No results found.  Procedures Procedures (including critical care time)  Medications Ordered in  ED Medications - No data to display   Initial Impression / Assessment and Plan / ED Course  Bethann BerkshireJoseph Amijah Timothy, MD has reviewed the triage vital signs and the nursing notes.  Pertinent labs & imaging results that were available during my care of the patient were reviewed by me and considered in my medical decision making (see chart for details).  Clinical Course    Labs show patient has urinary tract infection. Patient improved with IV fluids. He'll be discharged with Keflex and will follow-up with his PCP  Final Clinical Impressions(s) / ED Diagnoses   Final diagnoses:  None    New Prescriptions New Prescriptions   No medications on file   The chart was scribed for me under my direct supervision.  I personally performed the history, physical, and medical decision making and all procedures in the  evaluation of this patient.Bethann Berkshire, MD 08/08/16 [phone]

## 2016-08-08 NOTE — ED Triage Notes (Signed)
Pt had been traveling, home last night. Family reports pt has not been feeling well the past 2-3 days. Pt thinks he may have a UTI. C/O weakness and hypertension.

## 2016-08-08 NOTE — ED Notes (Signed)
Pt's family reports general malaise x 2-3 days. States they think he has a UTI. Denies GU symptoms. Pt hard of hearing.

## 2016-08-08 NOTE — Discharge Instructions (Signed)
Drink plenty of fluids and follow-up with their family doctor this week

## 2016-08-11 LAB — URINE CULTURE: Culture: >=100000 — AB

## 2016-08-12 ENCOUNTER — Telehealth (HOSPITAL_COMMUNITY): Payer: Self-pay

## 2016-08-12 NOTE — Telephone Encounter (Signed)
Post ED Visit - Positive Culture Follow-up  Culture report reviewed by antimicrobial stewardship pharmacist:  []  Enzo BiNathan Batchelder, Pharm.D. []  Celedonio MiyamotoJeremy Frens, Pharm.D., BCPS []  Garvin FilaMike Maccia, Pharm.D. []  Georgina PillionElizabeth Martin, Pharm.D., BCPS []  Elko New MarketMinh Pham, [address]Ph[address]D., BCPS, AAHIVP []  Estella HuskMichelle Turner, Pharm.D., BCPS, AAHIVP []  Tennis Mustassie Stewart, Pharm.D. []  Sherle Poeob Vincent, [address]Ph[address] Candida PeelingX  Caroline Welles, Pharm.D.  Positive urine culture,>/= 100,000 colonies -> E Coli Treated with Cephalexin, organism sensitive to the same and no further patient follow-up is required at this time.   Arvid RightClark, Kaleia Longhi Dorn 08/12/2016, 8:56 AM

## 2016-09-25 ENCOUNTER — Encounter: Payer: Self-pay | Admitting: Family

## 2016-10-12 ENCOUNTER — Ambulatory Visit (HOSPITAL_COMMUNITY)
Admission: RE | Admit: 2016-10-12 | Discharge: 2016-10-12 | Disposition: A | Discharge status: Home / Self Care | Payer: Medicare Other | Source: Ambulatory Visit | Attending: Family | Admitting: Family

## 2016-10-12 ENCOUNTER — Encounter: Payer: Self-pay | Admitting: Family

## 2016-10-12 ENCOUNTER — Ambulatory Visit (INDEPENDENT_AMBULATORY_CARE_PROVIDER_SITE_OTHER): Payer: Medicare Other | Admitting: Family

## 2016-10-12 VITALS — BP 141/78 | HR 55 | Temp 97.9°F | Resp 18 | Ht 72.0 in | Wt 230.0 lb

## 2016-10-12 DIAGNOSIS — Z9889 Other specified postprocedural states: Secondary | ICD-10-CM | POA: Insufficient documentation

## 2016-10-12 DIAGNOSIS — Z48812 Encounter for surgical aftercare following surgery on the circulatory system: Secondary | ICD-10-CM

## 2016-10-12 DIAGNOSIS — I6523 Occlusion and stenosis of bilateral carotid arteries: Secondary | ICD-10-CM | POA: Diagnosis not present

## 2016-10-12 NOTE — Progress Notes (Signed)
Chief Complaint: Follow up Extracranial Carotid Artery Stenosis   History of Present Illness  Jeffrey Ho is a 82 y.o. male patient of Dr. Myra Gianotti who is s/p right CEA 02/20/09 and left CEA on 03/28/09. He lost vision in his right eye right before the right CEA, this may have been determined to be a stroke, he has since had no other stroke or TIA symptoms.  Specifically the patient denies a history of unilateral of facial drooping, denies a history of hemiplegia, and denies a history of receptive or expressive aphasia.  The pt denies claudication symptoms with walking. He had hearing loss since childhood, wife sates this has worsened.  The patient reports New Medical or Surgical History: none. He states his blood pressure at home is about 120/70; states his blood pressure increases in a medical setting.   Pt Diabetic: No Pt smoker: former smoker, quit in the 1960's  Pt meds include: Statin: yes ASA: Yes Other anticoagulants/antiplatelets: no   Past Medical History:  Diagnosis Date  . Blindness of right eye   . Carotid artery occlusion   . HOH (hard of hearing)   . Hyperlipidemia   . Hypertension     Social History Social History  Substance Use Topics  . Smoking status: Former Smoker    Quit date: 08/10/1969  . Smokeless tobacco: Never Used  . Alcohol use No    Family History Family History  Problem Relation Age of Onset  . Heart disease Father     Before age 59    Surgical History Past Surgical History:  Procedure Laterality Date  . CAROTID ENDARTERECTOMY     left CEA  06/24.2010  . CAROTID ENDARTERECTOMY     right CEA  02/20/2009  . EYE SURGERY Left Nov. 2015   Cataract    No Known Allergies  Current Outpatient Prescriptions  Medication Sig Dispense Refill  . aspirin 81 MG tablet Take 81 mg by mouth daily.    Marland Kitchen atorvastatin (LIPITOR) 20 MG tablet 1 tablet daily.  0  . fish oil-omega-3 fatty acids 1000 MG capsule Take 2 g by mouth daily.     . hydrochlorothiazide (HYDRODIURIL) 25 MG tablet Take 25 mg by mouth daily.    . metoprolol (LOPRESSOR) 50 MG tablet 50 mg 2 (two) times daily.     . niacin 500 MG tablet Take 500 mg by mouth daily with breakfast.    . oseltamivir (TAMIFLU) 75 MG capsule   0  . cephALEXin (KEFLEX) 500 MG capsule Take 1 capsule (500 mg total) by mouth 4 (four) times daily. (Patient not taking: Reported on 10/12/2016) 28 capsule 0   No current facility-administered medications for this visit.     Review of Systems : See HPI for pertinent positives and negatives.  Physical Examination  Vitals:   10/12/16 0858 10/12/16 0902  BP: 138/74 (!) 141/78  Pulse: (!) 55 (!) 55  Resp: 18   Temp: 97.9 F (36.6 C)   SpO2: 99%   Weight: 230 lb (104.3 kg)   Height: 6' (1.829 m)    Body mass index is 31.19 kg/m.  General: WDWN male in NAD GAIT: normal Eyes: Left pupil reacts to light. Pulmonary: Respirations are non-labored, CTAB, no rales,  rhonchi, or wheezing.  Cardiac: regular rhythm, no detected murmur.  VASCULAR EXAM Carotid Bruits Right Left   Negative Negative   Radial pulses are 2+ palpable and equal.      LE Pulses Right Left   POPLITEAL not  palpable  not palpable   POSTERIOR TIBIAL  palpable   palpable    DORSALIS PEDIS  ANTERIOR TIBIAL not palpable  not palpable     Gastrointestinal: soft, nontender, BS WNL, no r/g .  Musculoskeletal: No muscle atrophy/wasting. M/S 5/5 throughout, Extremities without ischemic changes.  Neurologic: A&O X 3; appropriate affect, normal sensation, Speech is normal CN 2-12 intact except hard of hearing, Pain and light touch intact in extremities, Motor exam as listed above.    Assessment: Jeffrey Ho is a 82 y.o. male who is s/p right CEA 02/20/09 and left CEA on  03/28/09.  He lost vision in his right eye right before the right CEA, this may have been determined to be a stroke, he has since had no other stroke or TIA symptoms.   DATA Both CEA sites remain patent with no evidence of restenosis.  Bilateral vertebral artery flow is antegrade.  Bilateral subclavian artery waveforms are normal.  No significant change compared to the last exam on 09-02-15.    Plan: Follow-up in 18 months with Carotid Duplex scan.   I discussed in depth with the patient the nature of atherosclerosis, and emphasized the importance of maximal medical management including strict control of blood pressure, blood glucose, and lipid levels, obtaining regular exercise, and continued cessation of smoking.  The patient is aware that without maximal medical management the underlying atherosclerotic disease process will progress, limiting the benefit of any interventions. The patient was given information about stroke prevention and what symptoms should prompt the patient to seek immediate medical care. Thank you for allowing us to participate in this patient's care.  Charisse MarchSuzanne Corlene Sabia, RN, MSN, FNP-C [facility] [place] Office: [phone]  Clinic Physician: Myra GianottiBrabham  10/12/16 9:03 AM

## 2016-10-12 NOTE — Patient Instructions (Signed)
Stroke Prevention Some medical conditions and behaviors are associated with an increased chance of having a stroke. You may prevent a stroke by making healthy choices and managing medical conditions. How can I reduce my risk of having a stroke?  Stay physically active. Get at least 30 minutes of activity on most or all days.  Do not smoke. It may also be helpful to avoid exposure to secondhand smoke.  Limit alcohol use. Moderate alcohol use is considered to be:  No more than 2 drinks per day for men.  No more than 1 drink per day for nonpregnant women.  Eat healthy foods. This involves:  Eating 5 or more servings of fruits and vegetables a day.  Making dietary changes that address high blood pressure (hypertension), high cholesterol, diabetes, or obesity.  Manage your cholesterol levels.  Making food choices that are high in fiber and low in saturated fat, trans fat, and cholesterol may control cholesterol levels.  Take any prescribed medicines to control cholesterol as directed by your health care provider.  Manage your diabetes.  Controlling your carbohydrate and sugar intake is recommended to manage diabetes.  Take any prescribed medicines to control diabetes as directed by your health care provider.  Control your hypertension.  Making food choices that are low in salt (sodium), saturated fat, trans fat, and cholesterol is recommended to manage hypertension.  Ask your health care provider if you need treatment to lower your blood pressure. Take any prescribed medicines to control hypertension as directed by your health care provider.  If you are 18-39 years of age, have your blood pressure checked every 3-5 years. If you are 40 years of age or older, have your blood pressure checked every year.  Maintain a healthy weight.  Reducing calorie intake and making food choices that are low in sodium, saturated fat, trans fat, and cholesterol are recommended to manage  weight.  Stop drug abuse.  Avoid taking birth control pills.  Talk to your health care provider about the risks of taking birth control pills if you are over 35 years old, smoke, get migraines, or have ever had a blood clot.  Get evaluated for sleep disorders (sleep apnea).  Talk to your health care provider about getting a sleep evaluation if you snore a lot or have excessive sleepiness.  Take medicines only as directed by your health care provider.  For some people, aspirin or blood thinners (anticoagulants) are helpful in reducing the risk of forming abnormal blood clots that can lead to stroke. If you have the irregular heart rhythm of atrial fibrillation, you should be on a blood thinner unless there is a good reason you cannot take them.  Understand all your medicine instructions.  Make sure that other conditions (such as anemia or atherosclerosis) are addressed. Get help right away if:  You have sudden weakness or numbness of the face, arm, or leg, especially on one side of the body.  Your face or eyelid droops to one side.  You have sudden confusion.  You have trouble speaking (aphasia) or understanding.  You have sudden trouble seeing in one or both eyes.  You have sudden trouble walking.  You have dizziness.  You have a loss of balance or coordination.  You have a sudden, severe headache with no known cause.  You have new chest pain or an irregular heartbeat. Any of these symptoms may represent a serious problem that is an emergency. Do not wait to see if the symptoms will go away.   Get medical help at once. Call your local emergency services (911 in [place] Do not drive yourself to the hospital. This information is not intended to replace advice given to you by your health care provider. Make sure you discuss any questions you have with your health care provider. Document Released: 10/29/2004 Document Revised: 02/27/2016 Document Reviewed: 03/24/2013 Elsevier  Interactive Patient Education  2017 [employer].  

## 2016-10-13 NOTE — Addendum Note (Signed)
Addended by: Burton ApleyPETTY, Roise Emert A on: 10/13/2016 03:38 PM   Modules accepted: Orders

## 2016-11-17 DIAGNOSIS — H02839 Dermatochalasis of unspecified eye, unspecified eyelid: Secondary | ICD-10-CM | POA: Diagnosis not present

## 2016-11-17 DIAGNOSIS — H18413 Arcus senilis, bilateral: Secondary | ICD-10-CM | POA: Diagnosis not present

## 2016-11-17 DIAGNOSIS — H3411 Central retinal artery occlusion, right eye: Secondary | ICD-10-CM | POA: Diagnosis not present

## 2016-11-17 DIAGNOSIS — I10 Essential (primary) hypertension: Secondary | ICD-10-CM | POA: Diagnosis not present

## 2017-01-19 DIAGNOSIS — H6122 Impacted cerumen, left ear: Secondary | ICD-10-CM | POA: Diagnosis not present

## 2017-03-12 DIAGNOSIS — Z6831 Body mass index (BMI) 31.0-31.9, adult: Secondary | ICD-10-CM | POA: Diagnosis not present

## 2017-03-12 DIAGNOSIS — Z1389 Encounter for screening for other disorder: Secondary | ICD-10-CM | POA: Diagnosis not present

## 2017-03-12 DIAGNOSIS — E6609 Other obesity due to excess calories: Secondary | ICD-10-CM | POA: Diagnosis not present

## 2017-03-12 DIAGNOSIS — J301 Allergic rhinitis due to pollen: Secondary | ICD-10-CM | POA: Diagnosis not present

## 2017-04-19 ENCOUNTER — Ambulatory Visit (INDEPENDENT_AMBULATORY_CARE_PROVIDER_SITE_OTHER): Payer: Medicare Other | Admitting: Otolaryngology

## 2017-04-19 DIAGNOSIS — H903 Sensorineural hearing loss, bilateral: Secondary | ICD-10-CM | POA: Diagnosis not present

## 2017-04-19 DIAGNOSIS — J343 Hypertrophy of nasal turbinates: Secondary | ICD-10-CM

## 2017-04-19 DIAGNOSIS — J342 Deviated nasal septum: Secondary | ICD-10-CM | POA: Diagnosis not present

## 2017-04-19 DIAGNOSIS — H9072 Mixed conductive and sensorineural hearing loss, unilateral, left ear, with unrestricted hearing on the contralateral side: Secondary | ICD-10-CM

## 2017-04-23 DIAGNOSIS — H6982 Other specified disorders of Eustachian tube, left ear: Secondary | ICD-10-CM | POA: Diagnosis not present

## 2017-04-23 DIAGNOSIS — H6522 Chronic serous otitis media, left ear: Secondary | ICD-10-CM | POA: Diagnosis not present

## 2017-04-23 DIAGNOSIS — H9012 Conductive hearing loss, unilateral, left ear, with unrestricted hearing on the contralateral side: Secondary | ICD-10-CM | POA: Diagnosis not present

## 2017-06-03 ENCOUNTER — Ambulatory Visit (INDEPENDENT_AMBULATORY_CARE_PROVIDER_SITE_OTHER): Payer: Medicare Other | Admitting: Otolaryngology

## 2017-06-03 DIAGNOSIS — H7202 Central perforation of tympanic membrane, left ear: Secondary | ICD-10-CM | POA: Diagnosis not present

## 2017-06-03 DIAGNOSIS — H6983 Other specified disorders of Eustachian tube, bilateral: Secondary | ICD-10-CM

## 2017-06-03 DIAGNOSIS — H903 Sensorineural hearing loss, bilateral: Secondary | ICD-10-CM | POA: Diagnosis not present

## 2017-07-13 DIAGNOSIS — E669 Obesity, unspecified: Secondary | ICD-10-CM | POA: Diagnosis not present

## 2017-07-13 DIAGNOSIS — I10 Essential (primary) hypertension: Secondary | ICD-10-CM | POA: Diagnosis not present

## 2017-07-13 DIAGNOSIS — J329 Chronic sinusitis, unspecified: Secondary | ICD-10-CM | POA: Diagnosis not present

## 2017-07-13 DIAGNOSIS — Z6831 Body mass index (BMI) 31.0-31.9, adult: Secondary | ICD-10-CM | POA: Diagnosis not present

## 2017-07-16 DIAGNOSIS — E6609 Other obesity due to excess calories: Secondary | ICD-10-CM | POA: Diagnosis not present

## 2017-07-16 DIAGNOSIS — E782 Mixed hyperlipidemia: Secondary | ICD-10-CM | POA: Diagnosis not present

## 2017-07-16 DIAGNOSIS — Z0001 Encounter for general adult medical examination with abnormal findings: Secondary | ICD-10-CM | POA: Diagnosis not present

## 2017-07-16 DIAGNOSIS — Z6830 Body mass index (BMI) 30.0-30.9, adult: Secondary | ICD-10-CM | POA: Diagnosis not present

## 2017-07-22 ENCOUNTER — Other Ambulatory Visit (HOSPITAL_COMMUNITY): Payer: Self-pay | Admitting: Family Medicine

## 2017-07-22 ENCOUNTER — Ambulatory Visit (HOSPITAL_COMMUNITY)
Admission: RE | Admit: 2017-07-22 | Discharge: 2017-07-22 | Disposition: A | Discharge status: Home / Self Care | Payer: Medicare Other | Source: Ambulatory Visit | Attending: Family Medicine | Admitting: Family Medicine

## 2017-07-22 DIAGNOSIS — Z6830 Body mass index (BMI) 30.0-30.9, adult: Secondary | ICD-10-CM | POA: Diagnosis not present

## 2017-07-22 DIAGNOSIS — J209 Acute bronchitis, unspecified: Secondary | ICD-10-CM

## 2017-07-22 DIAGNOSIS — J069 Acute upper respiratory infection, unspecified: Secondary | ICD-10-CM

## 2017-07-22 DIAGNOSIS — R918 Other nonspecific abnormal finding of lung field: Secondary | ICD-10-CM | POA: Insufficient documentation

## 2017-07-22 DIAGNOSIS — R05 Cough: Secondary | ICD-10-CM | POA: Diagnosis not present

## 2017-08-05 DIAGNOSIS — Z23 Encounter for immunization: Secondary | ICD-10-CM | POA: Diagnosis not present

## 2017-08-11 ENCOUNTER — Other Ambulatory Visit (HOSPITAL_COMMUNITY): Payer: Self-pay | Admitting: Physician Assistant

## 2017-08-11 ENCOUNTER — Ambulatory Visit (HOSPITAL_COMMUNITY)
Admission: RE | Admit: 2017-08-11 | Discharge: 2017-08-11 | Disposition: A | Discharge status: Home / Self Care | Payer: Medicare Other | Source: Ambulatory Visit | Attending: Physician Assistant | Admitting: Physician Assistant

## 2017-08-11 DIAGNOSIS — E6609 Other obesity due to excess calories: Secondary | ICD-10-CM | POA: Diagnosis not present

## 2017-08-11 DIAGNOSIS — J189 Pneumonia, unspecified organism: Secondary | ICD-10-CM

## 2017-08-11 DIAGNOSIS — Z6830 Body mass index (BMI) 30.0-30.9, adult: Secondary | ICD-10-CM | POA: Diagnosis not present

## 2017-11-25 DIAGNOSIS — J069 Acute upper respiratory infection, unspecified: Secondary | ICD-10-CM | POA: Diagnosis not present

## 2017-11-25 DIAGNOSIS — Z1389 Encounter for screening for other disorder: Secondary | ICD-10-CM | POA: Diagnosis not present

## 2017-11-25 DIAGNOSIS — Z6831 Body mass index (BMI) 31.0-31.9, adult: Secondary | ICD-10-CM | POA: Diagnosis not present

## 2017-11-25 DIAGNOSIS — E6609 Other obesity due to excess calories: Secondary | ICD-10-CM | POA: Diagnosis not present

## 2018-01-14 DIAGNOSIS — H02839 Dermatochalasis of unspecified eye, unspecified eyelid: Secondary | ICD-10-CM | POA: Diagnosis not present

## 2018-01-14 DIAGNOSIS — I10 Essential (primary) hypertension: Secondary | ICD-10-CM | POA: Diagnosis not present

## 2018-01-14 DIAGNOSIS — H3411 Central retinal artery occlusion, right eye: Secondary | ICD-10-CM | POA: Diagnosis not present

## 2018-01-14 DIAGNOSIS — H18413 Arcus senilis, bilateral: Secondary | ICD-10-CM | POA: Diagnosis not present

## 2018-03-21 ENCOUNTER — Other Ambulatory Visit: Payer: Self-pay

## 2018-03-21 DIAGNOSIS — I6523 Occlusion and stenosis of bilateral carotid arteries: Secondary | ICD-10-CM

## 2018-05-02 ENCOUNTER — Ambulatory Visit: Payer: Medicare Other | Admitting: Family

## 2018-05-02 ENCOUNTER — Encounter: Payer: Self-pay | Admitting: Family

## 2018-05-02 ENCOUNTER — Ambulatory Visit (INDEPENDENT_AMBULATORY_CARE_PROVIDER_SITE_OTHER): Payer: Medicare Other | Admitting: Family

## 2018-05-02 ENCOUNTER — Ambulatory Visit (HOSPITAL_COMMUNITY)
Admission: RE | Admit: 2018-05-02 | Discharge: 2018-05-02 | Disposition: A | Discharge status: Home / Self Care | Payer: Medicare Other | Source: Ambulatory Visit | Attending: Family | Admitting: Family

## 2018-05-02 VITALS — BP 152/87 | HR 67 | Temp 96.7°F | Resp 20 | Ht 72.0 in | Wt 229.8 lb

## 2018-05-02 DIAGNOSIS — I6523 Occlusion and stenosis of bilateral carotid arteries: Secondary | ICD-10-CM

## 2018-05-02 DIAGNOSIS — Z9889 Other specified postprocedural states: Secondary | ICD-10-CM | POA: Diagnosis not present

## 2018-05-02 NOTE — Patient Instructions (Signed)

## 2018-05-02 NOTE — Progress Notes (Signed)
Chief Complaint: Follow up Extracranial Carotid Artery Stenosis   History of Present Illness  Jeffrey Ho is a 83 y.o. male who is s/p right CEA 02/20/09 and left CEA on 03/28/09 by Dr. Myra Gianotti. He lost vision in his right eye right before the right CEA, this may have been determined to be a stroke, no subsequent stroke or TIA sx's.   The pt denies claudication type symptoms with walking. He had hearing loss since childhood, wife sates this has worsened.   He states his blood pressure at home is about 120/70; that his blood pressure increases in a medical setting.   Diabetic: No Tobacco use: former smoker, quit in the 1960's  Pt meds include: Statin: yes ASA: Yes Other anticoagulants/antiplatelets: no   Past Medical History:  Diagnosis Date  . Blindness of right eye   . Carotid artery occlusion   . HOH (hard of hearing)   . Hyperlipidemia   . Hypertension     Social History Social History   Tobacco Use  . Smoking status: Former Smoker    Last attempt to quit: 08/10/1969    Years since quitting: 48.7  . Smokeless tobacco: Never Used  Substance Use Topics  . Alcohol use: No  . Drug use: No    Family History Family History  Problem Relation Age of Onset  . Heart disease Father        Before age 39    Surgical History Past Surgical History:  Procedure Laterality Date  . CAROTID ENDARTERECTOMY     left CEA  06/24.2010  . CAROTID ENDARTERECTOMY     right CEA  02/20/2009  . EYE SURGERY Left Nov. 2015   Cataract    No Known Allergies  Current Outpatient Medications  Medication Sig Dispense Refill  . aspirin 81 MG tablet Take 81 mg by mouth daily.    Marland Kitchen atorvastatin (LIPITOR) 20 MG tablet 1 tablet daily.  0  . cephALEXin (KEFLEX) 500 MG capsule Take 1 capsule (500 mg total) by mouth 4 (four) times daily. 28 capsule 0  . fish oil-omega-3 fatty acids 1000 MG capsule Take 2 g by mouth daily.    . hydrochlorothiazide (HYDRODIURIL) 25 MG tablet Take 25  mg by mouth daily.    . metoprolol (LOPRESSOR) 50 MG tablet 50 mg 2 (two) times daily.     . niacin 500 MG tablet Take 500 mg by mouth daily with breakfast.    . oseltamivir (TAMIFLU) 75 MG capsule   0   No current facility-administered medications for this visit.     Review of Systems : See HPI for pertinent positives and negatives.  Physical Examination  Vitals:   05/02/18 1332 05/02/18 1335  BP: (!) 154/79 (!) 152/87  Pulse: 67   Resp: 20   Temp: (!) 96.7 F (35.9 C)   TempSrc: Oral   SpO2: 96%   Weight: 229 lb 12.8 oz (104.2 kg)   Height: 6' (1.829 m)    Body mass index is 31.17 kg/m.  General: WDWN obese male in NAD GAIT:normal HENT: unremarkable  Eyes: PERRLA Pulmonary: Respirations are non-labored, CTAB, no rales, rhonchi, or wheezing. Cardiac: regular rhythm, no detected murmur.  VASCULAR EXAM Carotid Bruits Right Left   Negative Negative   Radial pulses are 2+ palpable and equal. Abdominal aortic pulse is not palpable       LE Pulses Right Left   POPLITEAL not palpable  not palpable   POSTERIOR TIBIAL  palpable   palpable  DORSALIS PEDIS  ANTERIOR TIBIAL not palpable  not palpable     Gastrointestinal: soft, nontender, BS WNL, no r/g . Musculoskeletal: No muscle atrophy/wasting. M/S 5/5 throughout, Extremities without ischemic changes. Skin: No rashes, no ulcers, no cellulitis.   Neurologic:  A&O X 3; appropriate affect, sensation is normal; speech is normal, CN 2-12 intact except is hard of hearing, pain and light touch intact in extremities, motor exam as listed above. Psychiatric: Normal thought content, mood appropriate to clinical situation.    Assessment: Jeffrey Ho is a 83 y.o. male who is s/p right CEA 02/20/09 and left CEA on 03/28/09.  He lost vision in his  right eye right before the right CEA, this may have been determined to be a stroke, no subsequent stroke or TIA symptoms.   Fortunately he does not have DM and quit smoking in the 1960's. He takes a daily ASA and a statin.   DATA Carotid Duplex (05-02-18): Bilateral ICA, CEA sites, with no stenosis. Bilateral vertebral artery flow is antegrade.  Bilateral subclavian artery waveforms are normal.  No change compared to the exams on 09-02-15 and 10-12-16.    Plan:  Follow-up in 18 months with Carotid Duplex scan.   I discussed in depth with the patient the nature of atherosclerosis, and emphasized the importance of maximal medical management including strict control of blood pressure, blood glucose, and lipid levels, obtaining regular exercise, and continued cessation of smoking.  The patient is aware that without maximal medical management the underlying atherosclerotic disease process will progress, limiting the benefit of any interventions. The patient was given information about stroke prevention and what symptoms should prompt the patient to seek immediate medical care. Thank you for allowing us to participate in this patient's care.  Charisse MarchSuzanne Demetrick Eichenberger, RN, MSN, FNP-C [facility] Office: [phone]  Clinic Physician: Darrick PennaFields  05/02/18 1:59 PM

## 2018-05-04 DIAGNOSIS — J45990 Exercise induced bronchospasm: Secondary | ICD-10-CM | POA: Diagnosis not present

## 2018-05-04 DIAGNOSIS — R0602 Shortness of breath: Secondary | ICD-10-CM | POA: Diagnosis not present

## 2018-05-04 DIAGNOSIS — Z6831 Body mass index (BMI) 31.0-31.9, adult: Secondary | ICD-10-CM | POA: Diagnosis not present

## 2018-05-04 DIAGNOSIS — J302 Other seasonal allergic rhinitis: Secondary | ICD-10-CM | POA: Diagnosis not present

## 2018-06-24 DIAGNOSIS — Z8701 Personal history of pneumonia (recurrent): Secondary | ICD-10-CM | POA: Diagnosis not present

## 2018-06-24 DIAGNOSIS — J3089 Other allergic rhinitis: Secondary | ICD-10-CM | POA: Diagnosis not present

## 2018-06-24 DIAGNOSIS — Z6831 Body mass index (BMI) 31.0-31.9, adult: Secondary | ICD-10-CM | POA: Diagnosis not present

## 2018-06-24 DIAGNOSIS — H9193 Unspecified hearing loss, bilateral: Secondary | ICD-10-CM | POA: Diagnosis not present

## 2018-08-02 DIAGNOSIS — E6609 Other obesity due to excess calories: Secondary | ICD-10-CM | POA: Diagnosis not present

## 2018-08-02 DIAGNOSIS — Z1389 Encounter for screening for other disorder: Secondary | ICD-10-CM | POA: Diagnosis not present

## 2018-08-02 DIAGNOSIS — R7309 Other abnormal glucose: Secondary | ICD-10-CM | POA: Diagnosis not present

## 2018-08-02 DIAGNOSIS — Z23 Encounter for immunization: Secondary | ICD-10-CM | POA: Diagnosis not present

## 2018-08-02 DIAGNOSIS — Z0001 Encounter for general adult medical examination with abnormal findings: Secondary | ICD-10-CM | POA: Diagnosis not present

## 2018-08-02 DIAGNOSIS — Z6831 Body mass index (BMI) 31.0-31.9, adult: Secondary | ICD-10-CM | POA: Diagnosis not present

## 2018-08-02 DIAGNOSIS — Z0000 Encounter for general adult medical examination without abnormal findings: Secondary | ICD-10-CM | POA: Diagnosis not present

## 2018-10-26 DIAGNOSIS — Z6831 Body mass index (BMI) 31.0-31.9, adult: Secondary | ICD-10-CM | POA: Diagnosis not present

## 2018-10-26 DIAGNOSIS — R109 Unspecified abdominal pain: Secondary | ICD-10-CM | POA: Diagnosis not present

## 2018-10-26 DIAGNOSIS — J029 Acute pharyngitis, unspecified: Secondary | ICD-10-CM | POA: Diagnosis not present

## 2018-10-26 DIAGNOSIS — J343 Hypertrophy of nasal turbinates: Secondary | ICD-10-CM | POA: Diagnosis not present

## 2019-08-07 DIAGNOSIS — Z6831 Body mass index (BMI) 31.0-31.9, adult: Secondary | ICD-10-CM | POA: Diagnosis not present

## 2019-08-07 DIAGNOSIS — Z0000 Encounter for general adult medical examination without abnormal findings: Secondary | ICD-10-CM | POA: Diagnosis not present

## 2019-08-07 DIAGNOSIS — Z0001 Encounter for general adult medical examination with abnormal findings: Secondary | ICD-10-CM | POA: Diagnosis not present

## 2019-08-07 DIAGNOSIS — Z23 Encounter for immunization: Secondary | ICD-10-CM | POA: Diagnosis not present

## 2019-08-07 DIAGNOSIS — I10 Essential (primary) hypertension: Secondary | ICD-10-CM | POA: Diagnosis not present

## 2019-08-07 DIAGNOSIS — Z1389 Encounter for screening for other disorder: Secondary | ICD-10-CM | POA: Diagnosis not present

## 2019-08-07 DIAGNOSIS — E7849 Other hyperlipidemia: Secondary | ICD-10-CM | POA: Diagnosis not present

## 2019-09-07 DIAGNOSIS — E6609 Other obesity due to excess calories: Secondary | ICD-10-CM | POA: Diagnosis not present

## 2019-09-07 DIAGNOSIS — H6123 Impacted cerumen, bilateral: Secondary | ICD-10-CM | POA: Diagnosis not present

## 2019-09-07 DIAGNOSIS — Z6831 Body mass index (BMI) 31.0-31.9, adult: Secondary | ICD-10-CM | POA: Diagnosis not present

## 2019-10-27 ENCOUNTER — Ambulatory Visit: Payer: Medicare Other | Attending: Internal Medicine

## 2019-10-27 DIAGNOSIS — Z23 Encounter for immunization: Secondary | ICD-10-CM

## 2019-10-27 NOTE — Progress Notes (Signed)
   Covid-19 Vaccination Clinic  Name:  EZRAH PANNING    MRN: [id number] DOB: 1935/01/23  10/27/2019  Mr. Mizuno was observed post Covid-19 immunization for 15 minutes without incidence. He was provided with Vaccine Information Sheet and instruction to access the V-Safe system.   Mr. Jaber was instructed to call 911 with any severe reactions post vaccine: Marland Kitchen Difficulty breathing  . Swelling of your face and throat  . A fast heartbeat  . A bad rash all over your body  . Dizziness and weakness    Immunizations Administered    Name Date Dose VIS Date Route   Pfizer COVID-19 Vaccine 10/27/2019  9:08 AM 0.3 mL 09/15/2019 Intramuscular   Manufacturer: [employer], [employer]   Lot: [id number]   NDC: [address]-2

## 2019-11-17 ENCOUNTER — Ambulatory Visit: Payer: Medicare Other | Attending: Internal Medicine

## 2019-11-17 DIAGNOSIS — Z23 Encounter for immunization: Secondary | ICD-10-CM | POA: Insufficient documentation

## 2019-11-17 NOTE — Progress Notes (Signed)
   Covid-19 Vaccination Clinic  Name:  Jeffrey Ho    MRN: [id number] DOB: [birth year]  11/17/2019  [name] was observed post Covid-19 immunization for 15 minutes without incidence. He was provided with Vaccine Information Sheet and instruction to access the V-Safe system.   [name] was instructed to call 911 with any severe reactions post vaccine: Marland Kitchen Difficulty breathing  . Swelling of your face and throat  . A fast heartbeat  . A bad rash all over your body  . Dizziness and weakness    Immunizations Administered    Name Date Dose VIS Date Route   Pfizer COVID-19 Vaccine 11/17/2019  9:59 AM 0.3 mL 09/15/2019 Intramuscular   Manufacturer: [employer], [employer]   Lot: [id number]   NDC: [address]-4

## 2019-12-07 ENCOUNTER — Other Ambulatory Visit: Payer: Self-pay

## 2019-12-07 ENCOUNTER — Other Ambulatory Visit (HOSPITAL_COMMUNITY): Payer: Self-pay | Admitting: Internal Medicine

## 2019-12-07 ENCOUNTER — Ambulatory Visit (HOSPITAL_COMMUNITY)
Admission: RE | Admit: 2019-12-07 | Discharge: 2019-12-07 | Disposition: A | Discharge status: Home / Self Care | Payer: Medicare Other | Source: Ambulatory Visit | Attending: Internal Medicine | Admitting: Internal Medicine

## 2019-12-07 DIAGNOSIS — R05 Cough: Secondary | ICD-10-CM

## 2019-12-07 DIAGNOSIS — J209 Acute bronchitis, unspecified: Secondary | ICD-10-CM | POA: Diagnosis not present

## 2019-12-07 DIAGNOSIS — J90 Pleural effusion, not elsewhere classified: Secondary | ICD-10-CM | POA: Diagnosis not present

## 2019-12-07 DIAGNOSIS — Z681 Body mass index (BMI) 19 or less, adult: Secondary | ICD-10-CM | POA: Diagnosis not present

## 2019-12-07 DIAGNOSIS — E669 Obesity, unspecified: Secondary | ICD-10-CM | POA: Diagnosis not present

## 2019-12-07 DIAGNOSIS — R059 Cough, unspecified: Secondary | ICD-10-CM

## 2019-12-07 DIAGNOSIS — I10 Essential (primary) hypertension: Secondary | ICD-10-CM | POA: Diagnosis not present

## 2019-12-25 ENCOUNTER — Ambulatory Visit (HOSPITAL_COMMUNITY)
Admission: RE | Admit: 2019-12-25 | Discharge: 2019-12-25 | Disposition: A | Discharge status: Home / Self Care | Payer: Medicare Other | Source: Ambulatory Visit | Attending: Internal Medicine | Admitting: Internal Medicine

## 2019-12-25 ENCOUNTER — Other Ambulatory Visit: Payer: Self-pay

## 2019-12-25 ENCOUNTER — Other Ambulatory Visit (HOSPITAL_COMMUNITY): Payer: Self-pay | Admitting: Internal Medicine

## 2019-12-25 DIAGNOSIS — R0600 Dyspnea, unspecified: Secondary | ICD-10-CM

## 2019-12-25 DIAGNOSIS — R5383 Other fatigue: Secondary | ICD-10-CM | POA: Diagnosis not present

## 2019-12-25 DIAGNOSIS — Z6830 Body mass index (BMI) 30.0-30.9, adult: Secondary | ICD-10-CM | POA: Diagnosis not present

## 2019-12-25 DIAGNOSIS — Z23 Encounter for immunization: Secondary | ICD-10-CM | POA: Diagnosis not present

## 2019-12-25 DIAGNOSIS — J90 Pleural effusion, not elsewhere classified: Secondary | ICD-10-CM | POA: Diagnosis not present

## 2019-12-25 DIAGNOSIS — J189 Pneumonia, unspecified organism: Secondary | ICD-10-CM | POA: Diagnosis not present

## 2019-12-25 DIAGNOSIS — I350 Nonrheumatic aortic (valve) stenosis: Secondary | ICD-10-CM | POA: Diagnosis not present

## 2019-12-25 DIAGNOSIS — D649 Anemia, unspecified: Secondary | ICD-10-CM | POA: Diagnosis not present

## 2019-12-27 ENCOUNTER — Other Ambulatory Visit (HOSPITAL_COMMUNITY): Payer: Self-pay | Admitting: Internal Medicine

## 2019-12-27 ENCOUNTER — Other Ambulatory Visit: Payer: Self-pay | Admitting: Internal Medicine

## 2019-12-27 DIAGNOSIS — R9389 Abnormal findings on diagnostic imaging of other specified body structures: Secondary | ICD-10-CM

## 2019-12-29 ENCOUNTER — Ambulatory Visit (HOSPITAL_COMMUNITY)
Admission: RE | Admit: 2019-12-29 | Discharge: 2019-12-29 | Disposition: A | Discharge status: Home / Self Care | Payer: Medicare Other | Source: Ambulatory Visit | Attending: Internal Medicine | Admitting: Internal Medicine

## 2019-12-29 ENCOUNTER — Other Ambulatory Visit: Payer: Self-pay

## 2019-12-29 DIAGNOSIS — J189 Pneumonia, unspecified organism: Secondary | ICD-10-CM | POA: Diagnosis not present

## 2019-12-29 DIAGNOSIS — R9389 Abnormal findings on diagnostic imaging of other specified body structures: Secondary | ICD-10-CM

## 2019-12-29 DIAGNOSIS — J90 Pleural effusion, not elsewhere classified: Secondary | ICD-10-CM | POA: Diagnosis not present

## 2019-12-29 DIAGNOSIS — U071 COVID-19: Secondary | ICD-10-CM | POA: Diagnosis not present

## 2019-12-29 LAB — POCT I-STAT CREATININE: Creatinine, Ser: 0.9 mg/dL (ref 0.61–1.24)

## 2019-12-29 MED ORDER — IOHEXOL 300 MG/ML  SOLN
75.0000 mL | Freq: Once | INTRAMUSCULAR | Status: AC | PRN
  Administered 2019-12-29: 75 mL via INTRAVENOUS

## 2019-12-31 ENCOUNTER — Encounter (HOSPITAL_COMMUNITY): Payer: Self-pay

## 2019-12-31 ENCOUNTER — Inpatient Hospital Stay (HOSPITAL_COMMUNITY)
Admission: EM | Admit: 2019-12-31 | Discharge: 2020-01-02 | DRG: 194 | Disposition: A | Discharge status: Home / Self Care | Payer: Medicare Other | Attending: Internal Medicine | Admitting: Internal Medicine

## 2019-12-31 ENCOUNTER — Other Ambulatory Visit: Payer: Self-pay

## 2019-12-31 DIAGNOSIS — D509 Iron deficiency anemia, unspecified: Secondary | ICD-10-CM | POA: Diagnosis not present

## 2019-12-31 DIAGNOSIS — J189 Pneumonia, unspecified organism: Secondary | ICD-10-CM | POA: Diagnosis not present

## 2019-12-31 DIAGNOSIS — R739 Hyperglycemia, unspecified: Secondary | ICD-10-CM | POA: Diagnosis present

## 2019-12-31 DIAGNOSIS — Z79899 Other long term (current) drug therapy: Secondary | ICD-10-CM

## 2019-12-31 DIAGNOSIS — Z8249 Family history of ischemic heart disease and other diseases of the circulatory system: Secondary | ICD-10-CM

## 2019-12-31 DIAGNOSIS — R195 Other fecal abnormalities: Secondary | ICD-10-CM | POA: Diagnosis not present

## 2019-12-31 DIAGNOSIS — E663 Overweight: Secondary | ICD-10-CM | POA: Diagnosis present

## 2019-12-31 DIAGNOSIS — I248 Other forms of acute ischemic heart disease: Secondary | ICD-10-CM | POA: Diagnosis present

## 2019-12-31 DIAGNOSIS — R0689 Other abnormalities of breathing: Secondary | ICD-10-CM | POA: Diagnosis not present

## 2019-12-31 DIAGNOSIS — I10 Essential (primary) hypertension: Secondary | ICD-10-CM | POA: Diagnosis present

## 2019-12-31 DIAGNOSIS — Z6830 Body mass index (BMI) 30.0-30.9, adult: Secondary | ICD-10-CM

## 2019-12-31 DIAGNOSIS — Z87891 Personal history of nicotine dependence: Secondary | ICD-10-CM | POA: Diagnosis not present

## 2019-12-31 DIAGNOSIS — H5461 Unqualified visual loss, right eye, normal vision left eye: Secondary | ICD-10-CM | POA: Diagnosis present

## 2019-12-31 DIAGNOSIS — I259 Chronic ischemic heart disease, unspecified: Secondary | ICD-10-CM | POA: Diagnosis not present

## 2019-12-31 DIAGNOSIS — R42 Dizziness and giddiness: Secondary | ICD-10-CM

## 2019-12-31 DIAGNOSIS — I350 Nonrheumatic aortic (valve) stenosis: Secondary | ICD-10-CM | POA: Diagnosis not present

## 2019-12-31 DIAGNOSIS — R61 Generalized hyperhidrosis: Secondary | ICD-10-CM | POA: Diagnosis not present

## 2019-12-31 DIAGNOSIS — R531 Weakness: Secondary | ICD-10-CM

## 2019-12-31 DIAGNOSIS — R55 Syncope and collapse: Secondary | ICD-10-CM | POA: Diagnosis not present

## 2019-12-31 DIAGNOSIS — I159 Secondary hypertension, unspecified: Secondary | ICD-10-CM

## 2019-12-31 DIAGNOSIS — E785 Hyperlipidemia, unspecified: Secondary | ICD-10-CM | POA: Diagnosis not present

## 2019-12-31 DIAGNOSIS — Z20822 Contact with and (suspected) exposure to covid-19: Secondary | ICD-10-CM | POA: Diagnosis present

## 2019-12-31 DIAGNOSIS — I779 Disorder of arteries and arterioles, unspecified: Secondary | ICD-10-CM | POA: Diagnosis present

## 2019-12-31 LAB — CBC WITH DIFFERENTIAL/PLATELET
Abs Immature Granulocytes: 0.07 10*3/uL (ref 0.00–0.07)
Basophils Absolute: 0.1 10*3/uL (ref 0.0–0.1)
Basophils Relative: 0 %
Eosinophils Absolute: 0.5 10*3/uL (ref 0.0–0.5)
Eosinophils Relative: 3 %
HCT: 30.2 % — ABNORMAL LOW (ref 39.0–52.0)
Hemoglobin: 9.9 g/dL — ABNORMAL LOW (ref 13.0–17.0)
Immature Granulocytes: 0 %
Lymphocytes Relative: 13 %
Lymphs Abs: 2 10*3/uL (ref 0.7–4.0)
MCH: 29.7 pg (ref 26.0–34.0)
MCHC: 32.8 g/dL (ref 30.0–36.0)
MCV: 90.7 fL (ref 80.0–100.0)
Monocytes Absolute: 1.3 10*3/uL — ABNORMAL HIGH (ref 0.1–1.0)
Monocytes Relative: 8 %
Neutro Abs: 12.3 10*3/uL — ABNORMAL HIGH (ref 1.7–7.7)
Neutrophils Relative %: 76 %
Platelets: 381 10*3/uL (ref 150–400)
RBC: 3.33 MIL/uL — ABNORMAL LOW (ref 4.22–5.81)
RDW: 12 % (ref 11.5–15.5)
WBC: 16.2 10*3/uL — ABNORMAL HIGH (ref 4.0–10.5)
nRBC: 0 % (ref 0.0–0.2)

## 2019-12-31 LAB — BASIC METABOLIC PANEL
Anion gap: 9 (ref 5–15)
BUN: 18 mg/dL (ref 8–23)
CO2: 25 mmol/L (ref 22–32)
Calcium: 8.5 mg/dL — ABNORMAL LOW (ref 8.9–10.3)
Chloride: 100 mmol/L (ref 98–111)
Creatinine, Ser: 0.99 mg/dL (ref 0.61–1.24)
GFR calc Af Amer: >60 mL/min (ref >60)
GFR calc non Af Amer: >60 mL/min (ref >60)
Glucose, Bld: 159 mg/dL — ABNORMAL HIGH (ref 70–99)
Potassium: 3.6 mmol/L (ref 3.5–5.1)
Sodium: 134 mmol/L — ABNORMAL LOW (ref 135–145)

## 2019-12-31 LAB — URINALYSIS, ROUTINE W REFLEX MICROSCOPIC
Bacteria, UA: NONE SEEN
Bilirubin Urine: NEGATIVE
Glucose, UA: NEGATIVE mg/dL
Hgb urine dipstick: NEGATIVE
Ketones, ur: NEGATIVE mg/dL
Leukocytes,Ua: NEGATIVE
Nitrite: NEGATIVE
Protein, ur: 30 mg/dL — AB
Specific Gravity, Urine: 1.027 (ref 1.005–1.030)
pH: 5 (ref 5.0–8.0)

## 2019-12-31 LAB — POC OCCULT BLOOD, ED: Fecal Occult Bld: POSITIVE — AB

## 2019-12-31 LAB — TROPONIN I (HIGH SENSITIVITY)
Troponin I (High Sensitivity): 17 ng/L (ref <18)
Troponin I (High Sensitivity): 19 ng/L — ABNORMAL HIGH (ref <18)

## 2019-12-31 MED ORDER — NIACIN 500 MG PO TABS
500.0000 mg | ORAL_TABLET | Freq: Every day | ORAL | Status: DC
  Administered 2020-01-01 – 2020-01-02 (×2): 500 mg via ORAL
  Filled 2019-12-31 (×4): qty 1

## 2019-12-31 MED ORDER — AZITHROMYCIN 250 MG PO TABS
500.0000 mg | ORAL_TABLET | Freq: Every day | ORAL | Status: DC
  Administered 2019-12-31 – 2020-01-02 (×3): 500 mg via ORAL
  Filled 2019-12-31 (×3): qty 2

## 2019-12-31 MED ORDER — SODIUM CHLORIDE 0.9 % IV SOLN
2.0000 g | INTRAVENOUS | Status: DC
  Administered 2019-12-31 – 2020-01-01 (×2): 2 g via INTRAVENOUS
  Filled 2019-12-31 (×2): qty 20

## 2019-12-31 MED ORDER — HYDROCHLOROTHIAZIDE 25 MG PO TABS
25.0000 mg | ORAL_TABLET | Freq: Every day | ORAL | Status: DC
  Administered 2019-12-31 – 2020-01-02 (×3): 25 mg via ORAL
  Filled 2019-12-31 (×3): qty 1

## 2019-12-31 MED ORDER — OMEGA-3 FATTY ACIDS 1000 MG PO CAPS
2.0000 g | ORAL_CAPSULE | Freq: Every day | ORAL | Status: DC
  Filled 2019-12-31 (×3): qty 2

## 2019-12-31 MED ORDER — ASPIRIN EC 81 MG PO TBEC
81.0000 mg | DELAYED_RELEASE_TABLET | Freq: Every day | ORAL | Status: DC
  Administered 2019-12-31 – 2020-01-02 (×3): 81 mg via ORAL
  Filled 2019-12-31 (×5): qty 1

## 2019-12-31 MED ORDER — SODIUM CHLORIDE 0.9% FLUSH
3.0000 mL | Freq: Two times a day (BID) | INTRAVENOUS | Status: DC
  Administered 2019-12-31 – 2020-01-01 (×2): 3 mL via INTRAVENOUS

## 2019-12-31 MED ORDER — SODIUM CHLORIDE 0.9% FLUSH: 3.0000 mL | INTRAVENOUS | Status: DC | PRN

## 2019-12-31 MED ORDER — SODIUM CHLORIDE 0.9 % IV SOLN: 250.0000 mL | INTRAVENOUS | Status: DC | PRN

## 2019-12-31 MED ORDER — PANTOPRAZOLE SODIUM 40 MG PO TBEC
40.0000 mg | DELAYED_RELEASE_TABLET | Freq: Every day | ORAL | Status: DC
  Administered 2020-01-01 – 2020-01-02 (×2): 40 mg via ORAL
  Filled 2019-12-31 (×2): qty 1

## 2019-12-31 MED ORDER — ATORVASTATIN CALCIUM 20 MG PO TABS
20.0000 mg | ORAL_TABLET | Freq: Every day | ORAL | Status: DC
  Administered 2019-12-31 – 2020-01-02 (×3): 20 mg via ORAL
  Filled 2019-12-31: qty 1
  Filled 2019-12-31: qty 2
  Filled 2019-12-31: qty 1

## 2019-12-31 MED ORDER — METOPROLOL TARTRATE 50 MG PO TABS
50.0000 mg | ORAL_TABLET | Freq: Two times a day (BID) | ORAL | Status: DC
  Administered 2019-12-31 – 2020-01-02 (×4): 50 mg via ORAL
  Filled 2019-12-31 (×4): qty 1

## 2019-12-31 NOTE — ED Triage Notes (Addendum)
EMS reports has been having decreased energy for the past 4 or 6 weeks.  Reports found that his iron was low and was put on iron pills.  EMS says today pt had gotten home from taking a drive and sat down to eat.   Pt  Suddenly felt like he was going to pass out.  Wife called EMS.  EMS says pt was pale and diaphoretic when they got there.  EMS says when they arrived, pt said he wasn't able to move.  Reports symptoms have improved.  Reports occasional pvc's on monitor, 100% o2 sat on room air.  CBG 150.  BP 153/81 and decreased to 121/61 as they arrived to ED.  EMS says pt was very anxious when they arrived.   Pt reports feeling weak all over when he had his near syncopal episode.  Pt says feels like back to baseline at this time.

## 2019-12-31 NOTE — ED Notes (Signed)
Lab informed nurse only half of C K & B results will be ready tonight and the other half will be ready tomorrow.

## 2019-12-31 NOTE — H&P (Signed)
History and Physical    Jeffrey Ho GDJ:[id number] DOB: 1935/05/22 DOA: 12/31/2019  PCP: Redmond School, MD (Confirm with patient/family/NH records and if not entered, this has to be entered at [facility] point of entry) Patient coming from: home  I have personally briefly reviewed patient's old medical records in [place]  Chief Complaint: near syncope  HPI: Jeffrey Ho is a 85 y.o. male with medical history significant of Carotid artery disease, hyperlipidemia, hypertension. He has been feeling malaise and has had DOE for the past week. He has seen his primary physician (labs, etc not in [place]) and was started on iron for anemia. He had an abnormal CXR 12/25/19 with density posterior aspect RUL and RLL, CT chest 12/29/19 with atlectasis vs infiltrate RLL with loculate right pleural effusion. He endorses having a non-productive cough. He denies chest pain/tightness, denies abdominal pain. Endorses having dark stools which he attributed to iron tablets. Today after eating he felt like he was going to pass out. He may have lost conciousness for a couple of seconds but he felt to weak to get up. EMS was called. EMTs found him to be diaphoretic and pale. VSS. He was brougt to [facility] for evaluation.  ED Course: He was hemodynamically stable, afebrile with normal O2 sats. Lab rrevealed Glucose 159, WBC 16.2 with normal diff, Hgb 9.9. EKG with 2-4 mm ST depression in II, V3,V4. TRH called to admit for possible GI bleed, pneumonia and cardiac ischemia.  Review of Systems: As per HPI otherwise 10 point review of systems negative.    Past Medical History:  Diagnosis Date  . Blindness of right eye   . Carotid artery occlusion   . HOH (hard of hearing)   . Hyperlipidemia   . Hypertension     Past Surgical History:  Procedure Laterality Date  . CAROTID ENDARTERECTOMY     left CEA  06/24.2010  . CAROTID ENDARTERECTOMY     right CEA  02/20/2009  . EYE SURGERY Left Nov. 2015   Cataract    Soc Hx - married to his 51rd wife for 28 years. 4 children. 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 Lives with his wife. I-ADLs   reports that he quit smoking about 50 years ago. He has never used smokeless tobacco. He reports that he does not drink alcohol or use drugs.  No Known Allergies  Family History  Problem Relation Age of Onset  . Heart disease Father        Before age 61    Prior to Admission medications   Medication Sig Start Date End Date Taking? Authorizing Provider  aspirin 81 MG tablet Take 81 mg by mouth daily.   Yes [provider]  atorvastatin (LIPITOR) 20 MG tablet 1 tablet daily. 05/07/16  Yes [provider]  benzonatate (TESSALON) 100 MG capsule Take 100 mg by mouth 4 (four) times daily as needed for cough.  12/07/19  Yes [provider]  hydrochlorothiazide (HYDRODIURIL) 25 MG tablet Take 25 mg by mouth daily.   Yes [provider]  metoprolol (LOPRESSOR) 50 MG tablet Take 50 mg by mouth 2 (two) times daily.  08/08/12  Yes [provider]  fish oil-omega-3 fatty acids 1000 MG capsule Take 2 g by mouth daily.    [provider]  niacin 500 MG tablet Take 500 mg by mouth daily with breakfast.    [provider]    Physical Exam: Vitals:  12/31/19 1839 12/31/19 1840 12/31/19 1841 12/31/19 2209  BP:    (!) 171/87  Pulse: 71 72 74 85  Resp: (!) 25 (!) 23 (!) 23   Temp:      TempSrc:      SpO2: 99% 100% 99%   Weight:      Height:        Constitutional: NAD, calm, comfortable Vitals:   12/31/19 1839 12/31/19 1840 12/31/19 1841 12/31/19 2209  BP:    (!) 171/87  Pulse: 71 72 74 85  Resp: (!) 25 (!) 23 (!) 23   Temp:      TempSrc:      SpO2: 99% 100% 99%   Weight:      Height:       General:  Overweight heavyset man in no distress. HOH Eyes: PERRL, lids and conjunctivae normal ENMT: Mucous membranes are moist. Posterior pharynx clear of any exudate or  lesions.Normal dentition.  Neck: normal, supple, no masses, no thyromegaly Respiratory:  no wheezing, crackles at right base. Normal respiratory effort. No accessory muscle use.  Cardiovascular: Regular rate and rhythm, II/VI systolic mm RSB, soft II/VI systolic mm LSB, no rubs / gallops. No extremity edema. 2+ pedal pulses. No carotid bruits.  Abdomen: obese, no tenderness, no masses palpated. No hepatosplenomegaly. Bowel sounds positive.  Musculoskeletal: no clubbing / cyanosis. No joint deformity upper and lower extremities. Good ROM, no contractures. Normal muscle tone.  Skin: no rashes, lesions, ulcers. No induration Neurologic: CN 2-12 grossly intact except for HOH. Sensation intact,  Strength 5/5 in all 4.  Psychiatric: Normal judgment and insight. Alert and oriented x 3. Normal mood.     Labs on Admission: I have personally reviewed following labs and imaging studies  CBC: Recent Labs  Lab 12/31/19 1735  WBC 16.2*  NEUTROABS 12.3*  HGB 9.9*  HCT 30.2*  MCV 90.7  PLT 381   Basic Metabolic Panel: Recent Labs  Lab 12/29/19 1220 12/31/19 1735  NA  --  134*  K  --  3.6  CL  --  100  CO2  --  25  GLUCOSE  --  159*  BUN  --  18  CREATININE 0.90 0.99  CALCIUM  --  8.5*   GFR: Estimated Creatinine Clearance: 70.6 mL/min (by C-G formula based on SCr of 0.99 mg/dL). Liver Function Tests: No results for input(s): AST, ALT, ALKPHOS, BILITOT, PROT, ALBUMIN in the last 168 hours. No results for input(s): LIPASE, AMYLASE in the last 168 hours. No results for input(s): AMMONIA in the last 168 hours. Coagulation Profile: No results for input(s): INR, PROTIME in the last 168 hours. Cardiac Enzymes: No results for input(s): CKTOTAL, CKMB, CKMBINDEX, TROPONINI in the last 168 hours. BNP (last 3 results) No results for input(s): PROBNP in the last 8760 hours. HbA1C: No results for input(s): HGBA1C in the last 72 hours. CBG: No results for input(s): GLUCAP in the last 168  hours. Lipid Profile: No results for input(s): CHOL, HDL, LDLCALC, TRIG, CHOLHDL, LDLDIRECT in the last 72 hours. Thyroid Function Tests: No results for input(s): TSH, T4TOTAL, FREET4, T3FREE, THYROIDAB in the last 72 hours. Anemia Panel: No results for input(s): VITAMINB12, FOLATE, FERRITIN, TIBC, IRON, RETICCTPCT in the last 72 hours. Urine analysis:    Component Value Date/Time   COLORURINE YELLOW 12/31/2019 1729   APPEARANCEUR HAZY (A) 12/31/2019 1729   LABSPEC 1.027 12/31/2019 1729   PHURINE 5.0 12/31/2019 1729   GLUCOSEU NEGATIVE 12/31/2019 1729   HGBUR NEGATIVE 12/31/2019  1729   BILIRUBINUR NEGATIVE 12/31/2019 1729   KETONESUR NEGATIVE 12/31/2019 1729   PROTEINUR 30 (A) 12/31/2019 1729   UROBILINOGEN 0.2 03/28/2009 0611   NITRITE NEGATIVE 12/31/2019 1729   LEUKOCYTESUR NEGATIVE 12/31/2019 1729    Radiological Exams on Admission: No results found.  EKG: Independently reviewed. NSR with PVCs 30mm ST depression II, 39mm ST depression V3, 4mm ST depression V4  Assessment/Plan Active Problems:   Cardiac ischemia   CAP (community acquired pneumonia)   Heme positive stool   HTN (hypertension)   Carotid artery disease without cerebral infarction (HCC)   Hyperlipidemia   1. CAP - patient with DOE, malaise, abnormal CXR and CT chest with RLL infiltrate vs atelectasis, right pleural effusion; leukocytosis to 16.2. Normal O2 sats. Plan Tele admit  Rocephin 2g IV q24  Azithromycin 500 mg po daily x 5  F/u CBCD  2. Cardiac ischemia - high risk patient: HLD, HTN, hyperglycemic, known Carotid disease, heavy coronary calcification on CT. Ischemic changes on EKG. Initial Troponin 17. May be stress related to CAP and possible anemia Plan Telemetry admit  Cycle Troponin and CKMB  F/u EKG  2D echo - new mm  May need cardiology consult  3. Heme positive stool - patient with drop in Hgb, iron deficiency, heme positive stool. Per son-in-law he haad been complaining of GI/stomach pain  May contribute to cardiac ischemia Plan F/u CBC  PPI  GI consult to be called in AM  4. Hyperlipidemia - continue home meds  5. HTN - continue home meds.     DVT prophylaxis: SCDs Code Status: full code  Family Communication: wife present during exam. Called son-in-law Dr. Harlene Salts  [phone] Disposition Plan: home when medically stable  Consults called: please call GI in AM (with names) Admission status: inpatient telemetry   Illene Regulus MD [facility] Pager [phone]  If 7PM-7AM, please contact night-coverage [email or web address] Password [facility]  12/31/2019, 10:25 PM

## 2019-12-31 NOTE — ED Provider Notes (Addendum)
[facility] EMERGENCY DEPARTMENT Provider Note   CSN: [id number] Arrival date & time: 12/31/19  1705     History No chief complaint on file.   Jeffrey Ho is a 85 y.o. male.  Chief complaint: "Felt like I was going to pass out at lunch today".  Patient has not felt well for several weeks with decreased energy and malaise.  He was seen by his primary care doctor and iron was noted to be low.  He is currently on iron pills.  Today while eating lunch, he became pale and diaphoretic and was very weak.  Occasional PVCs on cardiac monitor.  Otherwise vital signs were good.  No specific chest pain, dyspnea, fever, sweats, chills, cough, dysuria.  Takes baby aspirin daily.        Past Medical History:  Diagnosis Date  . Blindness of right eye   . Carotid artery occlusion   . HOH (hard of hearing)   . Hyperlipidemia   . Hypertension     Patient Active Problem List   Diagnosis Date Noted  . Occlusion and stenosis of carotid artery without mention of cerebral infarction 08/22/2012    Past Surgical History:  Procedure Laterality Date  . CAROTID ENDARTERECTOMY     left CEA  06/24.2010  . CAROTID ENDARTERECTOMY     right CEA  02/20/2009  . EYE SURGERY Left Nov. 2015   Cataract       Family History  Problem Relation Age of Onset  . Heart disease Father        Before age 9    Social History   Tobacco Use  . Smoking status: Former Smoker    Quit date: 08/10/1969    Years since quitting: 50.4  . Smokeless tobacco: Never Used  Substance Use Topics  . Alcohol use: No  . Drug use: No    Home Medications Prior to Admission medications   Medication Sig Start Date End Date Taking? Authorizing Provider  aspirin 81 MG tablet Take 81 mg by mouth daily.    [provider]  atorvastatin (LIPITOR) 20 MG tablet 1 tablet daily. 05/07/16   [provider]  cephALEXin (KEFLEX) 500 MG capsule Take 1 capsule (500 mg total) by mouth 4 (four) times daily. 08/08/16    Bethann Berkshire, MD  fish oil-omega-3 fatty acids 1000 MG capsule Take 2 g by mouth daily.    [provider]  hydrochlorothiazide (HYDRODIURIL) 25 MG tablet Take 25 mg by mouth daily.    [provider]  metoprolol (LOPRESSOR) 50 MG tablet 50 mg 2 (two) times daily.  08/08/12   [provider]  niacin 500 MG tablet Take 500 mg by mouth daily with breakfast.    [provider]  oseltamivir (TAMIFLU) 75 MG capsule  09/26/16   [provider]    Allergies    Patient has no known allergies.  Review of Systems   Review of Systems  All other systems reviewed and are negative.   Physical Exam Updated Vital Signs BP (!) 129/59   Pulse 74   Temp 98.2 F (36.8 C) (Oral)   Resp (!) 23   Ht 6' (1.829 m)   Wt 100.7 kg   SpO2 99%   BMI 30.11 kg/m   Physical Exam Vitals and nursing note reviewed.  Constitutional:      Appearance: He is well-developed.     Comments: Pale, HOH, nad  HENT:     Head: Normocephalic and atraumatic.  Eyes:  Conjunctiva/sclera: Conjunctivae normal.  Cardiovascular:     Rate and Rhythm: Normal rate and regular rhythm.  Pulmonary:     Effort: Pulmonary effort is normal.     Breath sounds: Normal breath sounds.  Abdominal:     General: Bowel sounds are normal.     Palpations: Abdomen is soft.  Genitourinary:    Comments: Rectal exam: No masses, light black in color, heme positive. Musculoskeletal:        General: Normal range of motion.     Cervical back: Neck supple.  Skin:    General: Skin is warm and dry.  Neurological:     General: No focal deficit present.     Mental Status: He is alert and oriented to person, place, and time.  Psychiatric:        Behavior: Behavior normal.     ED Results / Procedures / Treatments   Labs (all labs ordered are listed, but only abnormal results are displayed) Labs Reviewed  CBC WITH DIFFERENTIAL/PLATELET - Abnormal; Notable for the following components:       Result Value   WBC 16.2 (*)    RBC 3.33 (*)    Hemoglobin 9.9 (*)    HCT 30.2 (*)    Neutro Abs 12.3 (*)    Monocytes Absolute 1.3 (*)    All other components within normal limits  BASIC METABOLIC PANEL - Abnormal; Notable for the following components:   Sodium 134 (*)    Glucose, Bld 159 (*)    Calcium 8.5 (*)    All other components within normal limits  URINALYSIS, ROUTINE W REFLEX MICROSCOPIC - Abnormal; Notable for the following components:   APPearance HAZY (*)    Protein, ur 30 (*)    All other components within normal limits  POC OCCULT BLOOD, ED - Abnormal; Notable for the following components:   Fecal Occult Bld POSITIVE (*)    All other components within normal limits  TROPONIN I (HIGH SENSITIVITY)    EKG EKG Interpretation  Date/Time:  Sunday December 31 2019 17:27:35 EDT Ventricular Rate:  88 PR Interval:    QRS Duration: 118 QT Interval:  404 QTC Calculation: 489 R Axis:   28 Text Interpretation: Sinus rhythm Paired ventricular premature complexes Incomplete right bundle branch block Repol abnrm suggests ischemia, diffuse leads Baseline wander in lead(s) V3 Confirmed by Donnetta Hutching ([id number]) on 12/31/2019 5:42:06 PM   Radiology No results found.  Procedures Procedures (including critical care time)  Medications Ordered in ED Medications - No data to display  ED Course  I have reviewed the triage vital signs and the nursing notes.  Pertinent labs & imaging results that were available during my care of the patient were reviewed by me and considered in my medical decision making (see chart for details).    MDM Rules/Calculators/A&P                      Patient presents with weakness and a sensation of passing out.  Hemoglobin today 9.9.  (Last hemoglobin 13.6 on 08/08/2016).  Rectal heme positive.  CT chest from 12/29/2019 reveals atelectasis and/or infiltrate posterior aspect of the right lower lobe.  Will admit for further observation.  CRITICAL  CARE Performed by: Donnetta Hutching Total critical care time: 30 minutes Critical care time was exclusive of separately billable procedures and treating other patients. Critical care was necessary to treat or prevent imminent or life-threatening deterioration. Critical care was time spent personally by me on  the following activities: development of treatment plan with patient and/or surrogate as well as nursing, discussions with consultants, evaluation of patient's response to treatment, examination of patient, obtaining history from patient or surrogate, ordering and performing treatments and interventions, ordering and review of laboratory studies, ordering and review of radiographic studies, pulse oximetry and re-evaluation of patient's condition. Final Clinical Impression(s) / ED Diagnoses Final diagnoses:  Weakness  Postural dizziness with presyncope    Rx / DC Orders ED Discharge Orders    None       Nat Christen, MD 12/31/19 2102    Nat Christen, MD 12/31/19 2136

## 2020-01-01 ENCOUNTER — Inpatient Hospital Stay (HOSPITAL_COMMUNITY): Payer: Medicare Other

## 2020-01-01 ENCOUNTER — Telehealth: Payer: Self-pay | Admitting: Gastroenterology

## 2020-01-01 DIAGNOSIS — I350 Nonrheumatic aortic (valve) stenosis: Secondary | ICD-10-CM

## 2020-01-01 LAB — CBC WITH DIFFERENTIAL/PLATELET
Abs Immature Granulocytes: 0.08 10*3/uL — ABNORMAL HIGH (ref 0.00–0.07)
Basophils Absolute: 0.1 10*3/uL (ref 0.0–0.1)
Basophils Relative: 0 %
Eosinophils Absolute: 0.2 10*3/uL (ref 0.0–0.5)
Eosinophils Relative: 1 %
HCT: 30.7 % — ABNORMAL LOW (ref 39.0–52.0)
Hemoglobin: 10.3 g/dL — ABNORMAL LOW (ref 13.0–17.0)
Immature Granulocytes: 1 %
Lymphocytes Relative: 7 %
Lymphs Abs: 1.3 10*3/uL (ref 0.7–4.0)
MCH: 30.2 pg (ref 26.0–34.0)
MCHC: 33.6 g/dL (ref 30.0–36.0)
MCV: 90 fL (ref 80.0–100.0)
Monocytes Absolute: 1.3 10*3/uL — ABNORMAL HIGH (ref 0.1–1.0)
Monocytes Relative: 7 %
Neutro Abs: 14.5 10*3/uL — ABNORMAL HIGH (ref 1.7–7.7)
Neutrophils Relative %: 84 %
Platelets: 408 10*3/uL — ABNORMAL HIGH (ref 150–400)
RBC: 3.41 MIL/uL — ABNORMAL LOW (ref 4.22–5.81)
RDW: 12.3 % (ref 11.5–15.5)
WBC: 17.4 10*3/uL — ABNORMAL HIGH (ref 4.0–10.5)
nRBC: 0 % (ref 0.0–0.2)

## 2020-01-01 LAB — RESPIRATORY PANEL BY PCR

## 2020-01-01 LAB — ECHOCARDIOGRAM COMPLETE
Height: 72 in
Weight: 3597.91 oz

## 2020-01-01 LAB — TROPONIN I (HIGH SENSITIVITY)
Troponin I (High Sensitivity): 23 ng/L — ABNORMAL HIGH (ref <18)
Troponin I (High Sensitivity): 20 ng/L — ABNORMAL HIGH (ref <18)
Troponin I (High Sensitivity): 22 ng/L — ABNORMAL HIGH (ref <18)

## 2020-01-01 LAB — RETICULOCYTES
Immature Retic Fract: 19.1 % — ABNORMAL HIGH (ref 2.3–15.9)
RBC.: 3.44 MIL/uL — ABNORMAL LOW (ref 4.22–5.81)
Retic Count, Absolute: 47.1 10*3/uL (ref 19.0–186.0)
Retic Ct Pct: 1.4 % (ref 0.4–3.1)

## 2020-01-01 LAB — IRON AND TIBC
Iron: 18 ug/dL — ABNORMAL LOW (ref 45–182)
Saturation Ratios: 10 % — ABNORMAL LOW (ref 17.9–39.5)
TIBC: 185 ug/dL — ABNORMAL LOW (ref 250–450)
UIBC: 167 ug/dL

## 2020-01-01 LAB — CK TOTAL AND CKMB (NOT AT ARMC)
CK, MB: 2.1 ng/mL (ref 0.5–5.0)
Relative Index: INVALID (ref 0.0–2.5)
Total CK: 39 U/L — ABNORMAL LOW (ref 49–397)

## 2020-01-01 LAB — FERRITIN: Ferritin: 831 ng/mL — ABNORMAL HIGH (ref 24–336)

## 2020-01-01 LAB — FOLATE: Folate: 6.9 ng/mL (ref >5.9)

## 2020-01-01 LAB — HEMOGLOBIN A1C
Hgb A1c MFr Bld: 6.2 % — ABNORMAL HIGH (ref 4.8–5.6)
Mean Plasma Glucose: 131.24 mg/dL

## 2020-01-01 LAB — SARS CORONAVIRUS 2 (TAT 6-24 HRS): SARS Coronavirus 2: NEGATIVE

## 2020-01-01 LAB — VITAMIN B12: Vitamin B-12: 215 pg/mL (ref 180–914)

## 2020-01-01 MED ORDER — SALINE SPRAY 0.65 % NA SOLN
1.0000 | NASAL | Status: DC | PRN
  Filled 2020-01-01: qty 44

## 2020-01-01 MED ORDER — OMEGA-3-ACID ETHYL ESTERS 1 G PO CAPS
2.0000 g | ORAL_CAPSULE | Freq: Every day | ORAL | Status: DC
  Administered 2020-01-01 – 2020-01-02 (×2): 2 g via ORAL
  Filled 2020-01-01 (×2): qty 2

## 2020-01-01 MED ORDER — SODIUM CHLORIDE 0.9 % IV SOLN
510.0000 mg | Freq: Once | INTRAVENOUS | Status: AC
  Administered 2020-01-01: 510 mg via INTRAVENOUS
  Filled 2020-01-01: qty 17

## 2020-01-01 MED ORDER — HYDROXYZINE HCL 25 MG PO TABS
25.0000 mg | ORAL_TABLET | Freq: Three times a day (TID) | ORAL | Status: DC | PRN
  Filled 2020-01-01: qty 1

## 2020-01-01 NOTE — Progress Notes (Addendum)
PROGRESS NOTE    Jeffrey Ho  BMW:[id number] DOB: 11-26-1934 DOA: 12/31/2019 PCP: Redmond School, MD   Brief Narrative:  Per HPI: Jeffrey Ho is a 85 y.o. male with medical history significant of Carotid artery disease, hyperlipidemia, hypertension. He has been feeling malaise and has had DOE for the past week. He has seen his primary physician (labs, etc not in [place]) and was started on iron for anemia. He had an abnormal CXR 12/25/19 with density posterior aspect RUL and RLL, CT chest 12/29/19 with atlectasis vs infiltrate RLL with loculate right pleural effusion. He endorses having a non-productive cough. He denies chest pain/tightness, denies abdominal pain. Endorses having dark stools which he attributed to iron tablets. Today after eating he felt like he was going to pass out. He may have lost conciousness for a couple of seconds but he felt to weak to get up. EMS was called. EMTs found him to be diaphoretic and pale. VSS. He was brougt to [facility] for evaluation.  3/29: Patient admitted with community-acquired pneumonia and some demand ischemia.  He is also noted to have heme positive stools that will need outpatient GI work-up.  2D echocardiogram performed this a.m. with results pending.  No significant chest pain or troponin elevations otherwise noted.  He remains on Rocephin and azithromycin for now.  Likely discharge in a.m. if stable and improved.  Assessment & Plan:   Active Problems:   HTN (hypertension)   Carotid artery disease without cerebral infarction [facility])   Cardiac ischemia   CAP (community acquired pneumonia)   Hyperlipidemia   Heme positive stool   Community-acquired pneumonia -Continue treatment on Rocephin and azithromycin -Follow CBC  Demand ischemia likely secondary to above -Patient also has dyspnea on exertion for which 2D echocardiogram has been performed with results pending -Some noted ST depressions in the inferior and lateral leads present for which I  will repeat EKG in a.m. -Continue to monitor troponin trend, which has remained flat  Heme positive stool with noted anemia of iron deficiency -No overt bleeding identified -Plan for GI evaluation in the outpatient setting  Dyslipidemia -Continue atorvastatin  Hypertension -Continue HCTZ and metoprolol   DVT prophylaxis: SCDs Code Status: Full code Family Communication: Wife at bedside Disposition Plan: Plan to treat with current antibiotics and follow-up 2D echocardiogram and repeat labs in a.m.  If stable, may consider for discharge in a.m. with close follow-up to GI and cardiology   Consultants:   None  Procedures:   2D echocardiogram pending  Antimicrobials:  Anti-infectives (From admission, onward)   Start     Dose/Rate Route Frequency Ordered Stop   12/31/19 2145  cefTRIAXone (ROCEPHIN) 2 g in sodium chloride 0.9 % 100 mL IVPB     2 g 200 mL/hr over 30 Minutes Intravenous Every 24 hours 12/31/19 2144 01/05/20 2144   12/31/19 2145  azithromycin (ZITHROMAX) tablet 500 mg     500 mg Oral Daily 12/31/19 2144 01/05/20 0959       Subjective: Patient seen and evaluated today with no new acute complaints or concerns. No acute concerns or events noted overnight.  He is hard of hearing.  Objective: Vitals:   01/01/20 0019 01/01/20 0455 01/01/20 0910 01/01/20 0933  BP: (!) 184/79 (!) 142/61 (!) 148/91   Pulse: 81 81 91   Resp: 16 16 18    Temp: 98.2 F (36.8 C) 97.8 F (36.6 C)    TempSrc: Oral Oral    SpO2: 100% 100%  98%  Weight:  102 kg     Height: 6' (1.829 m)       Intake/Output Summary (Last 24 hours) at 01/01/2020 1212 Last data filed at 01/01/2020 0900 Gross per 24 hour  Intake 453.92 ml  Output --  Net 453.92 ml   Filed Weights   12/31/19 1712 01/01/20 0019  Weight: 100.7 kg 102 kg    Examination:  General exam: Appears calm and comfortable, hard of hearing Respiratory system: Clear to auscultation. Respiratory effort normal. Cardiovascular  system: S1 & S2 heard, RRR. No JVD, murmurs, rubs, gallops or clicks. No pedal edema. Gastrointestinal system: Abdomen is nondistended, soft and nontender. No organomegaly or masses felt. Normal bowel sounds heard. Central nervous system: Alert and oriented. No focal neurological deficits. Extremities: Symmetric 5 x 5 power. Skin: No rashes, lesions or ulcers Psychiatry: Judgement and insight appear normal. Mood & affect appropriate.     Data Reviewed: I have personally reviewed following labs and imaging studies  CBC: Recent Labs  Lab 12/31/19 1735 01/01/20 0804  WBC 16.2* 17.4*  NEUTROABS 12.3* 14.5*  HGB 9.9* 10.3*  HCT 30.2* 30.7*  MCV 90.7 90.0  PLT 381 408*   Basic Metabolic Panel: Recent Labs  Lab 12/29/19 1220 12/31/19 1735  NA  --  134*  K  --  3.6  CL  --  100  CO2  --  25  GLUCOSE  --  159*  BUN  --  18  CREATININE 0.90 0.99  CALCIUM  --  8.5*   GFR: Estimated Creatinine Clearance: 71.1 mL/min (by C-G formula based on SCr of 0.99 mg/dL). Liver Function Tests: No results for input(s): AST, ALT, ALKPHOS, BILITOT, PROT, ALBUMIN in the last 168 hours. No results for input(s): LIPASE, AMYLASE in the last 168 hours. No results for input(s): AMMONIA in the last 168 hours. Coagulation Profile: No results for input(s): INR, PROTIME in the last 168 hours. Cardiac Enzymes: Recent Labs  Lab 12/31/19 2203  CKTOTAL 39*  CKMB 2.1   BNP (last 3 results) No results for input(s): PROBNP in the last 8760 hours. HbA1C: No results for input(s): HGBA1C in the last 72 hours. CBG: No results for input(s): GLUCAP in the last 168 hours. Lipid Profile: No results for input(s): CHOL, HDL, LDLCALC, TRIG, CHOLHDL, LDLDIRECT in the last 72 hours. Thyroid Function Tests: No results for input(s): TSH, T4TOTAL, FREET4, T3FREE, THYROIDAB in the last 72 hours. Anemia Panel: Recent Labs    01/01/20 1001  VITAMINB12 215  FOLATE 6.9  FERRITIN 831*  TIBC 185*  IRON 18*    RETICCTPCT 1.4   Sepsis Labs: No results for input(s): PROCALCITON, LATICACIDVEN in the last 168 hours.  No results found for this or any previous visit (from the past 240 hour(s)).       Radiology Studies: No results found.      Scheduled Meds: . aspirin EC  81 mg Oral Daily  . atorvastatin  20 mg Oral Daily  . azithromycin  500 mg Oral Daily  . hydrochlorothiazide  25 mg Oral Daily  . metoprolol tartrate  50 mg Oral BID  . niacin  500 mg Oral Q breakfast  . omega-3 acid ethyl esters  2 g Oral Daily  . pantoprazole  40 mg Oral Daily  . sodium chloride flush  3 mL Intravenous Q12H   Continuous Infusions: . sodium chloride    . cefTRIAXone (ROCEPHIN)  IV Stopped (12/31/19 2255)     LOS: 1 day    Time spent: 35  minutes    Breklyn Fabrizio Hoover Brunette, DO [facility]  If 7PM-7AM, please contact night-coverage [email or web address] 01/01/2020, 12:12 PM

## 2020-01-01 NOTE — Progress Notes (Signed)
Respiratory Panel by PCR was collected and sent to lab at this time.

## 2020-01-01 NOTE — Telephone Encounter (Signed)
Spoke to patient nurse and she will notify him of appointment date and time

## 2020-01-01 NOTE — Telephone Encounter (Signed)
Please schedule new pt ov

## 2020-01-01 NOTE — Progress Notes (Signed)
  Echocardiogram 2D Echocardiogram has been performed.  Jeffrey Ho 01/01/2020, 11:25 AM

## 2020-01-01 NOTE — Telephone Encounter (Signed)
Jeffrey Ho, patient admitted with pneumonia. Found to be heme positive, IDA. Dr. Sherryll Burger requesting outpatient evaluation as new patient.

## 2020-01-02 LAB — BASIC METABOLIC PANEL
Anion gap: 11 (ref 5–15)
BUN: 16 mg/dL (ref 8–23)
CO2: 25 mmol/L (ref 22–32)
Calcium: 8.6 mg/dL — ABNORMAL LOW (ref 8.9–10.3)
Chloride: 97 mmol/L — ABNORMAL LOW (ref 98–111)
Creatinine, Ser: 0.85 mg/dL (ref 0.61–1.24)
GFR calc Af Amer: >60 mL/min (ref >60)
GFR calc non Af Amer: >60 mL/min (ref >60)
Glucose, Bld: 107 mg/dL — ABNORMAL HIGH (ref 70–99)
Potassium: 3.7 mmol/L (ref 3.5–5.1)
Sodium: 133 mmol/L — ABNORMAL LOW (ref 135–145)

## 2020-01-02 LAB — CBC
HCT: 30.8 % — ABNORMAL LOW (ref 39.0–52.0)
Hemoglobin: 10.1 g/dL — ABNORMAL LOW (ref 13.0–17.0)
MCH: 29.6 pg (ref 26.0–34.0)
MCHC: 32.8 g/dL (ref 30.0–36.0)
MCV: 90.3 fL (ref 80.0–100.0)
Platelets: 386 10*3/uL (ref 150–400)
RBC: 3.41 MIL/uL — ABNORMAL LOW (ref 4.22–5.81)
RDW: 12.2 % (ref 11.5–15.5)
WBC: 17 10*3/uL — ABNORMAL HIGH (ref 4.0–10.5)
nRBC: 0 % (ref 0.0–0.2)

## 2020-01-02 MED ORDER — VITAMIN C 250 MG PO TABS: 250.0000 mg | ORAL_TABLET | Freq: Every day | ORAL | 2 refills | Status: AC

## 2020-01-02 MED ORDER — FERROUS SULFATE 325 (65 FE) MG PO TABS: 325.0000 mg | ORAL_TABLET | Freq: Every day | ORAL | 3 refills | Status: DC

## 2020-01-02 MED ORDER — AMOXICILLIN-POT CLAVULANATE 875-125 MG PO TABS: 1.0000 | ORAL_TABLET | Freq: Two times a day (BID) | ORAL | 0 refills | Status: AC

## 2020-01-02 MED ORDER — DOCUSATE SODIUM 100 MG PO CAPS: 100.0000 mg | ORAL_CAPSULE | Freq: Two times a day (BID) | ORAL | 2 refills | Status: DC

## 2020-01-02 NOTE — Discharge Summary (Signed)
Physician Discharge Summary  Jeffrey Ho E Whiteaker ZOX:[id number] DOB: 04/21/1935 DOA: 12/31/2019  PCP: Elfredia NevinsFusco, Lawrence, MD  Admit date: 12/31/2019  Discharge date: 01/02/2020  Admitted From:Home  Disposition:  Home  Recommendations for Outpatient Follow-up:  1. Follow up with PCP in 1-2 weeks 2. Follow-up with GI as scheduled on 01/26/2020 due to concern for positive stool occult and iron deficiency anemia.  Continue iron supplementation as prescribed with stool softener along with vitamin C for better absorption.  Patient has received Feraheme during the stay. 3. Continue on Augmentin as prescribed for 5 more days to complete total 7-day course of treatment for community-acquired pneumonia 4. Continue on home medications as prior 5. No signs of ACS noted with normalization of EKG on day of discharge and 2D echocardiogram with LVEF 60-65% and no wall motion abnormalities.  Home Health: None  Equipment/Devices: None  Discharge Condition: Stable  CODE STATUS: Full  Diet recommendation: Heart Healthy  Brief/Interim Summary: Per HPI: Jeffrey Ho E Barhamis a 85 y.o.malewith medical history significant ofCarotidartery disease, hyperlipidemia, hypertension. He has been feeling malaise and has had DOE for the past week. He has seen his primary physician (labs, etc not in Epic) and was started on iron for anemia. He had an abnormal CXR 12/25/19 with density posterior aspect RUL and RLL, CT chest 12/29/19 with atlectasis vs infiltrate RLL with loculate right pleural effusion. He endorses having a non-productive cough. He denies chest pain/tightness, denies abdominal pain. Endorses having dark stools which he attributed to iron tablets. Today after eating he felt like he was going to pass out. He may have lost conciousness for a couple of seconds but he felt to weak to get up. EMS was called. EMTs found him to be diaphoretic and pale. VSS. He was brougt to [facility] for evaluation.  3/29: Patient admitted  with community-acquired pneumonia and some demand ischemia.  He is also noted to have heme positive stools that will need outpatient GI work-up.  2D echocardiogram performed this a.m. with results pending.  No significant chest pain or troponin elevations otherwise noted.  He remains on Rocephin and azithromycin for now.  Likely discharge in a.m. if stable and improved.  3/30: Patient is in stable condition with no fever noted overnight.  He still maintains a slight leukocytosis.  He will be discharged with Augmentin for 5 more days to complete course of treatment.  Hemoglobin levels have remained stable with no overt bleeding noted and he will follow-up with GI in the outpatient setting.  He has received Feraheme during this admission for iron deficiency and will remain on iron supplementation at home as prescribed.  No other acute events noted and patient is stable for discharge.  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  Heme positive stools with anemia of iron deficiency.  Discharge Instructions  Discharge Instructions    Diet - low sodium heart healthy   Complete by: As directed    Increase activity slowly   Complete by: As directed      Allergies as of 01/02/2020   No Known Allergies     Medication List    TAKE these medications   amoxicillin-clavulanate 875-125 MG tablet Commonly known as: Augmentin Take 1 tablet by mouth 2 (two) times daily for 5 days.  aspirin 81 MG tablet Take 81 mg by mouth daily.   atorvastatin 20 MG tablet Commonly known as: LIPITOR 1 tablet daily.   benzonatate 100 MG capsule Commonly known as: TESSALON Take 100 mg by mouth 4 (four) times daily as needed for cough.   docusate  sodium 100 MG capsule Commonly known as: Colace Take 1 capsule (100 mg total) by mouth 2 (two) times daily.   ferrous sulfate 325 (65 FE) MG tablet Take 1 tablet (325 mg total) by mouth daily.   fish oil-omega-3 fatty acids 1000 MG capsule Take 1 g by mouth daily.   hydrochlorothiazide 25 MG tablet Commonly known as: HYDRODIURIL Take 25 mg by mouth daily.   metoprolol tartrate 50 MG tablet Commonly known as: LOPRESSOR Take 50 mg by mouth 2 (two) times daily.   niacin 500 MG tablet Take 500 mg by mouth daily with breakfast.   vitamin C 250 MG tablet Commonly known as: ASCORBIC ACID Take 1 tablet (250 mg total) by mouth daily. Take with iron supplementation.      Follow-up Information    Annitta Needs, NP On 01/26/2020.   Specialty: Gastroenterology Why: at 10:00 am Contact information: [place] [address] [phone]        Redmond School, MD Follow up in 1 week(s).   Specialty: Internal Medicine Contact information: [address] [place] [address] [phone]          No Known Allergies  Consultations:  None   Procedures/Studies: DG Chest 2 View  Result Date: 12/25/2019 CLINICAL DATA:  Dyspnea. EXAM: CHEST - 2 VIEW COMPARISON:  12/07/2019 FINDINGS: Biapical nodularity is similar to prior and may reflect scarring. Focal airspace disease posterior right upper lobe is stable as is the collapse/consolidative opacity in the right base. Small right pleural effusion again noted. IMPRESSION: Persistent posterior right upper lobe and right base collapse/consolidative opacity with right pleural effusion. Given unilateral disease, CT chest with contrast recommended to further evaluate as underlying neoplasm not excluded. These results will be called to the ordering clinician or representative by the Radiologist Assistant, and communication documented in the PACS or [employer]. Electronically Signed   By: Misty Stanley M.D.   On:  12/25/2019 16:51   DG Chest 2 View  Result Date: 12/07/2019 CLINICAL DATA:  Cough.  Intermittent dyspnea.  COVID vaccination. EXAM: CHEST - 2 VIEW COMPARISON:  March 26, 2009. FINDINGS: The cardiomediastinal silhouette is normal. There is a small right pleural effusion with fluid layering along the right major fissure. Central pulmonary vascular congestion is present without overt pulmonary edema. Bilateral perihilar bronchitis is present without pneumonia. Patchy interstitial opacity projects on the dependent right upper lobe. There is no pneumothorax. Moderate skeletal degenerative changes and thoracic aortic calcified atherosclerosis are present. There is bilateral gynecomastia. IMPRESSION: Bilateral nonspecific bronchitis and small right pleural effusion extending along the right major fissure. Patchy alveolitis in the dependent right upper lobe, probably viral infection. Electronically Signed   By: Revonda Humphrey   On: 12/07/2019 21:59   CT CHEST W CONTRAST  Result Date: 12/29/2019 CLINICAL DATA:  Weakness and fatigue. Status post recent second COVID flu shot. EXAM: CT CHEST WITH CONTRAST TECHNIQUE: Multidetector CT imaging of the chest was performed during intravenous contrast administration. CONTRAST:  85mL OMNIPAQUE IOHEXOL 300 MG/ML  SOLN COMPARISON:  None. FINDINGS: Cardiovascular: There is moderate severity calcification of the thoracic aorta. Normal heart size. No pericardial effusion. Marked severity coronary artery calcification is seen. Mediastinum/Nodes: No enlarged  mediastinal, hilar, or axillary lymph nodes. Thyroid gland, trachea, and esophagus demonstrate no significant findings. Lungs/Pleura: Mild linear scarring and/or atelectasis is seen along the posterior aspects of the bilateral apices. Mild to moderate severity atelectasis and/or infiltrate is seen within the posterior aspect of the right lower lobe. There is a small right pleural effusion. This appears to be partially loculated in  nature. No pneumothorax is identified. Upper Abdomen: Tiny gallstones are seen within the gallbladder lumen. Musculoskeletal: Degenerative changes seen throughout the thoracic spine. IMPRESSION: 1. Mild to moderate severity atelectasis and/or infiltrate within the posterior aspect of the right lower lobe. 2. Small right pleural effusion. 3. Marked severity coronary artery calcification. 4. Cholelithiasis. Aortic Atherosclerosis (ICD10-I70.0). Electronically Signed   By: Aram Candela M.D.   On: 12/29/2019 16:25   ECHOCARDIOGRAM COMPLETE  Result Date: 01/01/2020    ECHOCARDIOGRAM REPORT   Patient Name:   Jeffrey Ho Date of Exam: 01/01/2020 Medical Rec #:  [id number]       Height:       72.0 in Accession #:    [id number]      Weight:       224.9 lb Date of Birth:  12-25-1934        BSA:          2.239 m Patient Age:    84 years        BP:           148/91 mmHg Patient Gender: M               HR:           91 bpm. [facility] Procedure: 2D Echo, Cardiac Doppler and Color Doppler Indications:    Murmur  History:        Patient has no prior history of Echocardiogram examinations.                 COPD, Signs/Symptoms:Dyspnea, Murmur and Pneumonia; Risk                 Factors:Hypertension and Dyslipidemia.  Sonographer:    Lavenia Atlas RDCS Referring Phys: 47 MICHAEL E NORINS  Sonographer Comments: Technically challenging study due to limited acoustic windows. Image acquisition challenging due to COPD. IMPRESSIONS  1. Left ventricular ejection fraction, by estimation, is 60 to 65%. The left ventricle has normal function. The left ventricle has no regional wall motion abnormalities. There is mild left ventricular hypertrophy. Left ventricular diastolic parameters were normal.  2. Right ventricular systolic function is normal. The right ventricular size is normal.  3. The mitral valve is normal in structure. No evidence of mitral valve regurgitation. No evidence of mitral stenosis.  4. The  aortic valve is tricuspid. Aortic valve regurgitation is not visualized. Mild aortic valve stenosis.  5. The inferior vena cava is normal in size with greater than 50% respiratory variability, suggesting right atrial pressure of 3 mmHg. FINDINGS  Left Ventricle: Left ventricular ejection fraction, by estimation, is 60 to 65%. The left ventricle has normal function. The left ventricle has no regional wall motion abnormalities. The left ventricular internal cavity size was normal in size. There is  mild left ventricular hypertrophy. Left ventricular diastolic parameters were normal. Right Ventricle: The right ventricular size is normal. No increase in right ventricular wall thickness. Right ventricular systolic function is normal. Left Atrium: Left atrial size was normal in size. Right Atrium: Right atrial size was normal in size. Pericardium: There is no evidence  of pericardial effusion. Mitral Valve: The mitral valve is normal in structure. Normal mobility of the mitral valve leaflets. No evidence of mitral valve regurgitation. No evidence of mitral valve stenosis. Tricuspid Valve: The tricuspid valve is normal in structure. Tricuspid valve regurgitation is trivial. No evidence of tricuspid stenosis. Aortic Valve: The aortic valve is tricuspid. . There is moderate thickening and moderate calcification of the aortic valve. Aortic valve regurgitation is not visualized. Mild aortic stenosis is present. There is moderate thickening of the aortic valve. There is moderate calcification of the aortic valve. Aortic valve mean gradient measures 10.0 mmHg. Aortic valve peak gradient measures 21.0 mmHg. Aortic valve area, by VTI measures 1.47 cm. Pulmonic Valve: The pulmonic valve was normal in structure. Pulmonic valve regurgitation is not visualized. No evidence of pulmonic stenosis. Aorta: The aortic root is normal in size and structure. Venous: The inferior vena cava is normal in size with greater than 50% respiratory  variability, suggesting right atrial pressure of 3 mmHg. IAS/Shunts: No atrial level shunt detected by color flow Doppler.  LEFT VENTRICLE PLAX 2D LVIDd:         4.32 cm  Diastology LVIDs:         3.16 cm  LV e' lateral:   6.53 cm/s LV PW:         1.31 cm  LV E/e' lateral: 14.0 LV IVS:        1.48 cm  LV e' medial:    7.62 cm/s LVOT diam:     2.00 cm  LV E/e' medial:  12.0 LV SV:         63 LV SV Index:   28 LVOT Area:     3.14 cm  RIGHT VENTRICLE RV Basal diam:  2.54 cm TAPSE (M-mode): 2.7 cm LEFT ATRIUM             Index LA diam:        3.30 cm 1.47 cm/m LA Vol (A2C):   71.0 ml 31.71 ml/m LA Vol (A4C):   55.3 ml 24.69 ml/m LA Biplane Vol: 63.7 ml 28.45 ml/m  AORTIC VALVE AV Area (Vmax):    1.31 cm AV Area (Vmean):   1.53 cm AV Area (VTI):     1.47 cm AV Vmax:           229.33 cm/s AV Vmean:          145.000 cm/s AV VTI:            0.428 m AV Peak Grad:      21.0 mmHg AV Mean Grad:      10.0 mmHg LVOT Vmax:         95.30 cm/s LVOT Vmean:        70.400 cm/s LVOT VTI:          0.200 m LVOT/AV VTI ratio: 0.47  AORTA Ao Root diam: 2.70 cm MITRAL VALVE MV Area (PHT): 4.15 cm    SHUNTS MV Decel Time: 183 msec    Systemic VTI:  0.20 m MV E velocity: 91.70 cm/s  Systemic Diam: 2.00 cm MV A velocity: 94.70 cm/s MV E/A ratio:  0.97 Charlton Haws MD Electronically signed by Charlton Haws MD Signature Date/Time: 01/01/2020/12:37:18 PM    Final     Discharge Exam: Vitals:   01/02/20 0053 01/02/20 0535  BP: 111/63 134/69  Pulse: 77 83  Resp: 16 18  Temp: 98 F (36.7 C)   SpO2: 97% 96%   Vitals:   01/01/20 2007  01/01/20 2144 01/02/20 0053 01/02/20 0535  BP:  124/62 111/63 134/69  Pulse:  87 77 83  Resp:  18 16 18   Temp:  98.6 F (37 C) 98 F (36.7 C)   TempSrc:  Oral Oral   SpO2: 96% 95% 97% 96%  Weight:      Height:        General: Pt is alert, awake, not in acute distress Cardiovascular: RRR, S1/S2 +, no rubs, no gallops Respiratory: CTA bilaterally, no wheezing, no rhonchi Abdominal: Soft,  NT, ND, bowel sounds + Extremities: no edema, no cyanosis    The results of significant diagnostics from this hospitalization (including imaging, microbiology, ancillary and laboratory) are listed below for reference.     Microbiology: Recent Results (from the past 240 hour(s))  SARS CORONAVIRUS 2 (TAT 6-24 HRS) Nasopharyngeal Nasopharyngeal Swab     Status: None   Collection Time: 12/31/19 11:15 PM   Specimen: Nasopharyngeal Swab  Result Value Ref Range Status   SARS Coronavirus 2 NEGATIVE NEGATIVE Final    Comment: (NOTE) SARS-CoV-2 target nucleic acids are NOT DETECTED. The SARS-CoV-2 RNA is generally detectable in upper and lower respiratory specimens during the acute phase of infection. Negative results do not preclude SARS-CoV-2 infection, do not rule out co-infections with other pathogens, and should not be used as the sole basis for treatment or other patient management decisions. Negative results must be combined with clinical observations, patient history, and epidemiological information. The expected result is Negative. Fact Sheet for Patients: 01/02/20 Fact Sheet for Healthcare Providers: HairSlick.no This test is not yet approved or cleared by the [place] FDA and  has been authorized for detection and/or diagnosis of SARS-CoV-2 by FDA under an Emergency Use Authorization (EUA). This EUA will remain  in effect (meaning this test can be used) for the duration of the COVID-19 declaration under Section 56 4(b)(1) of the Act, 21 U.S.C. section 360bbb-3(b)(1), unless the authorization is terminated or revoked sooner. Performed at [facility], [address] [place], [place]   Respiratory Panel by PCR     Status: None   Collection Time: 01/01/20  6:00 PM   Specimen: Nasopharyngeal Swab; Respiratory  Result Value Ref Range Status   Adenovirus NOT DETECTED NOT DETECTED Final    Coronavirus 229E NOT DETECTED NOT DETECTED Final    Comment: (NOTE) The Coronavirus on the Respiratory Panel, DOES NOT test for the novel  Coronavirus (2019 nCoV)    Coronavirus HKU1 NOT DETECTED NOT DETECTED Final   Coronavirus NL63 NOT DETECTED NOT DETECTED Final   Coronavirus OC43 NOT DETECTED NOT DETECTED Final   Metapneumovirus NOT DETECTED NOT DETECTED Final   Rhinovirus / Enterovirus NOT DETECTED NOT DETECTED Final   Influenza A NOT DETECTED NOT DETECTED Final   Influenza B NOT DETECTED NOT DETECTED Final   Parainfluenza Virus 1 NOT DETECTED NOT DETECTED Final   Parainfluenza Virus 2 NOT DETECTED NOT DETECTED Final   Parainfluenza Virus 3 NOT DETECTED NOT DETECTED Final   Parainfluenza Virus 4 NOT DETECTED NOT DETECTED Final   Respiratory Syncytial Virus NOT DETECTED NOT DETECTED Final   Bordetella pertussis NOT DETECTED NOT DETECTED Final   Chlamydophila pneumoniae NOT DETECTED NOT DETECTED Final   Mycoplasma pneumoniae NOT DETECTED NOT DETECTED Final    Comment: Performed at [facility], [address], [place], [place]     Labs: BNP (last 3 results) No results for input(s): BNP in the last 8760 hours. Basic Metabolic  Panel: Recent Labs  Lab 12/29/19 1220 12/31/19 1735 01/02/20 0522  NA  --  134* 133*  K  --  3.6 3.7  CL  --  100 97*  CO2  --  25 25  GLUCOSE  --  159* 107*  BUN  --  18 16  CREATININE 0.90 0.99 0.85  CALCIUM  --  8.5* 8.6*   Liver Function Tests: No results for input(s): AST, ALT, ALKPHOS, BILITOT, PROT, ALBUMIN in the last 168 hours. No results for input(s): LIPASE, AMYLASE in the last 168 hours. No results for input(s): AMMONIA in the last 168 hours. CBC: Recent Labs  Lab 12/31/19 1735 01/01/20 0804 01/02/20 0522  WBC 16.2* 17.4* 17.0*  NEUTROABS 12.3* 14.5*  --   HGB 9.9* 10.3* 10.1*  HCT 30.2* 30.7* 30.8*  MCV 90.7 90.0 90.3  PLT 381 408* 386   Cardiac Enzymes: Recent Labs  Lab 12/31/19 2203  CKTOTAL 39*   CKMB 2.1   BNP: Invalid input(s): POCBNP CBG: No results for input(s): GLUCAP in the last 168 hours. D-Dimer No results for input(s): DDIMER in the last 72 hours. Hgb A1c Recent Labs    01/01/20 0804  HGBA1C 6.2*   Lipid Profile No results for input(s): CHOL, HDL, LDLCALC, TRIG, CHOLHDL, LDLDIRECT in the last 72 hours. Thyroid function studies No results for input(s): TSH, T4TOTAL, T3FREE, THYROIDAB in the last 72 hours.  Invalid input(s): FREET3 Anemia work up Recent Labs    01/01/20 1001  VITAMINB12 215  FOLATE 6.9  FERRITIN 831*  TIBC 185*  IRON 18*  RETICCTPCT 1.4   Urinalysis    Component Value Date/Time   COLORURINE YELLOW 12/31/2019 1729   APPEARANCEUR HAZY (A) 12/31/2019 1729   LABSPEC 1.027 12/31/2019 1729   PHURINE 5.0 12/31/2019 1729   GLUCOSEU NEGATIVE 12/31/2019 1729   HGBUR NEGATIVE 12/31/2019 1729   BILIRUBINUR NEGATIVE 12/31/2019 1729   KETONESUR NEGATIVE 12/31/2019 1729   PROTEINUR 30 (A) 12/31/2019 1729   UROBILINOGEN 0.2 03/28/2009 0611   NITRITE NEGATIVE 12/31/2019 1729   LEUKOCYTESUR NEGATIVE 12/31/2019 1729   Sepsis Labs Invalid input(s): PROCALCITONIN,  WBC,  LACTICIDVEN Microbiology Recent Results (from the past 240 hour(s))  SARS CORONAVIRUS 2 (TAT 6-24 HRS) Nasopharyngeal Nasopharyngeal Swab     Status: None   Collection Time: 12/31/19 11:15 PM   Specimen: Nasopharyngeal Swab  Result Value Ref Range Status   SARS Coronavirus 2 NEGATIVE NEGATIVE Final    Comment: (NOTE) SARS-CoV-2 target nucleic acids are NOT DETECTED. The SARS-CoV-2 RNA is generally detectable in upper and lower respiratory specimens during the acute phase of infection. Negative results do not preclude SARS-CoV-2 infection, do not rule out co-infections with other pathogens, and should not be used as the sole basis for treatment or other patient management decisions. Negative results must be combined with clinical observations, patient history, and  epidemiological information. The expected result is Negative. Fact Sheet for Patients: HairSlick.no Fact Sheet for Healthcare Providers: quierodirigir.com This test is not yet approved or cleared by the [place] FDA and  has been authorized for detection and/or diagnosis of SARS-CoV-2 by FDA under an Emergency Use Authorization (EUA). This EUA will remain  in effect (meaning this test can be used) for the duration of the COVID-19 declaration under Section 56 4(b)(1) of the Act, 21 U.S.C. section 360bbb-3(b)(1), unless the authorization is terminated or revoked sooner. Performed at [facility], [address] [place], [place] [address]   Respiratory Panel by PCR  Status: None   Collection Time: 01/01/20  6:00 PM   Specimen: Nasopharyngeal Swab; Respiratory  Result Value Ref Range Status   Adenovirus NOT DETECTED NOT DETECTED Final   Coronavirus 229E NOT DETECTED NOT DETECTED Final    Comment: (NOTE) The Coronavirus on the Respiratory Panel, DOES NOT test for the novel  Coronavirus (2019 nCoV)    Coronavirus HKU1 NOT DETECTED NOT DETECTED Final   Coronavirus NL63 NOT DETECTED NOT DETECTED Final   Coronavirus OC43 NOT DETECTED NOT DETECTED Final   Metapneumovirus NOT DETECTED NOT DETECTED Final   Rhinovirus / Enterovirus NOT DETECTED NOT DETECTED Final   Influenza A NOT DETECTED NOT DETECTED Final   Influenza B NOT DETECTED NOT DETECTED Final   Parainfluenza Virus 1 NOT DETECTED NOT DETECTED Final   Parainfluenza Virus 2 NOT DETECTED NOT DETECTED Final   Parainfluenza Virus 3 NOT DETECTED NOT DETECTED Final   Parainfluenza Virus 4 NOT DETECTED NOT DETECTED Final   Respiratory Syncytial Virus NOT DETECTED NOT DETECTED Final   Bordetella pertussis NOT DETECTED NOT DETECTED Final   Chlamydophila pneumoniae NOT DETECTED NOT DETECTED Final   Mycoplasma pneumoniae NOT DETECTED NOT DETECTED Final    Comment: Performed  at [facility], [address] [place], [place] [address]     Time coordinating discharge: 35 minutes  SIGNED:   Erick Blinks, DO [facility] 01/02/2020, 9:21 AM  If 7PM-7AM, please contact night-coverage [email or web address]

## 2020-01-08 DIAGNOSIS — E6609 Other obesity due to excess calories: Secondary | ICD-10-CM | POA: Diagnosis not present

## 2020-01-08 DIAGNOSIS — D649 Anemia, unspecified: Secondary | ICD-10-CM | POA: Diagnosis not present

## 2020-01-08 DIAGNOSIS — Z1389 Encounter for screening for other disorder: Secondary | ICD-10-CM | POA: Diagnosis not present

## 2020-01-08 DIAGNOSIS — Z6830 Body mass index (BMI) 30.0-30.9, adult: Secondary | ICD-10-CM | POA: Diagnosis not present

## 2020-01-08 DIAGNOSIS — J189 Pneumonia, unspecified organism: Secondary | ICD-10-CM | POA: Diagnosis not present

## 2020-01-17 ENCOUNTER — Ambulatory Visit: Payer: Medicare Other | Admitting: Cardiovascular Disease

## 2020-01-18 ENCOUNTER — Ambulatory Visit: Payer: Medicare Other | Admitting: Cardiovascular Disease

## 2020-01-18 DIAGNOSIS — D72829 Elevated white blood cell count, unspecified: Secondary | ICD-10-CM | POA: Diagnosis not present

## 2020-01-18 DIAGNOSIS — E871 Hypo-osmolality and hyponatremia: Secondary | ICD-10-CM | POA: Diagnosis not present

## 2020-01-18 DIAGNOSIS — D649 Anemia, unspecified: Secondary | ICD-10-CM | POA: Diagnosis not present

## 2020-01-18 DIAGNOSIS — R5383 Other fatigue: Secondary | ICD-10-CM | POA: Diagnosis not present

## 2020-01-19 ENCOUNTER — Other Ambulatory Visit: Payer: Self-pay

## 2020-01-19 ENCOUNTER — Other Ambulatory Visit (HOSPITAL_COMMUNITY): Payer: Self-pay | Admitting: Internal Medicine

## 2020-01-19 ENCOUNTER — Encounter (HOSPITAL_COMMUNITY): Payer: Self-pay

## 2020-01-19 ENCOUNTER — Ambulatory Visit (HOSPITAL_COMMUNITY)
Admission: RE | Admit: 2020-01-19 | Discharge: 2020-01-19 | Disposition: A | Discharge status: Home / Self Care | Payer: Medicare Other | Source: Ambulatory Visit | Attending: Internal Medicine | Admitting: Internal Medicine

## 2020-01-19 DIAGNOSIS — J189 Pneumonia, unspecified organism: Secondary | ICD-10-CM

## 2020-01-19 DIAGNOSIS — R05 Cough: Secondary | ICD-10-CM | POA: Diagnosis not present

## 2020-01-22 DIAGNOSIS — D72829 Elevated white blood cell count, unspecified: Secondary | ICD-10-CM | POA: Diagnosis not present

## 2020-01-22 DIAGNOSIS — R5383 Other fatigue: Secondary | ICD-10-CM | POA: Diagnosis not present

## 2020-01-22 DIAGNOSIS — Z681 Body mass index (BMI) 19 or less, adult: Secondary | ICD-10-CM | POA: Diagnosis not present

## 2020-01-22 DIAGNOSIS — J189 Pneumonia, unspecified organism: Secondary | ICD-10-CM | POA: Diagnosis not present

## 2020-01-22 DIAGNOSIS — D649 Anemia, unspecified: Secondary | ICD-10-CM | POA: Diagnosis not present

## 2020-01-23 ENCOUNTER — Emergency Department (HOSPITAL_COMMUNITY): Payer: Medicare Other

## 2020-01-23 ENCOUNTER — Inpatient Hospital Stay (HOSPITAL_COMMUNITY)
Admission: EM | Admit: 2020-01-23 | Discharge: 2020-01-26 | DRG: 812 | Disposition: A | Discharge status: Home Health | Payer: Medicare Other | Attending: Internal Medicine | Admitting: Internal Medicine

## 2020-01-23 ENCOUNTER — Encounter (HOSPITAL_COMMUNITY): Payer: Self-pay | Admitting: *Deleted

## 2020-01-23 ENCOUNTER — Other Ambulatory Visit: Payer: Self-pay

## 2020-01-23 DIAGNOSIS — D72829 Elevated white blood cell count, unspecified: Secondary | ICD-10-CM | POA: Diagnosis present

## 2020-01-23 DIAGNOSIS — H5461 Unqualified visual loss, right eye, normal vision left eye: Secondary | ICD-10-CM | POA: Diagnosis not present

## 2020-01-23 DIAGNOSIS — E785 Hyperlipidemia, unspecified: Secondary | ICD-10-CM | POA: Diagnosis not present

## 2020-01-23 DIAGNOSIS — K449 Diaphragmatic hernia without obstruction or gangrene: Secondary | ICD-10-CM | POA: Diagnosis present

## 2020-01-23 DIAGNOSIS — Q438 Other specified congenital malformations of intestine: Secondary | ICD-10-CM | POA: Diagnosis not present

## 2020-01-23 DIAGNOSIS — K8020 Calculus of gallbladder without cholecystitis without obstruction: Secondary | ICD-10-CM | POA: Diagnosis not present

## 2020-01-23 DIAGNOSIS — Z20822 Contact with and (suspected) exposure to covid-19: Secondary | ICD-10-CM | POA: Diagnosis not present

## 2020-01-23 DIAGNOSIS — Z8701 Personal history of pneumonia (recurrent): Secondary | ICD-10-CM | POA: Diagnosis not present

## 2020-01-23 DIAGNOSIS — E440 Moderate protein-calorie malnutrition: Secondary | ICD-10-CM | POA: Diagnosis present

## 2020-01-23 DIAGNOSIS — K640 First degree hemorrhoids: Secondary | ICD-10-CM | POA: Diagnosis not present

## 2020-01-23 DIAGNOSIS — Z8711 Personal history of peptic ulcer disease: Secondary | ICD-10-CM

## 2020-01-23 DIAGNOSIS — K2100 Gastro-esophageal reflux disease with esophagitis, without bleeding: Secondary | ICD-10-CM | POA: Diagnosis present

## 2020-01-23 DIAGNOSIS — Z8249 Family history of ischemic heart disease and other diseases of the circulatory system: Secondary | ICD-10-CM

## 2020-01-23 DIAGNOSIS — D649 Anemia, unspecified: Secondary | ICD-10-CM | POA: Diagnosis present

## 2020-01-23 DIAGNOSIS — I10 Essential (primary) hypertension: Secondary | ICD-10-CM | POA: Diagnosis present

## 2020-01-23 DIAGNOSIS — K2950 Unspecified chronic gastritis without bleeding: Secondary | ICD-10-CM | POA: Diagnosis not present

## 2020-01-23 DIAGNOSIS — R531 Weakness: Secondary | ICD-10-CM

## 2020-01-23 DIAGNOSIS — R195 Other fecal abnormalities: Secondary | ICD-10-CM | POA: Diagnosis not present

## 2020-01-23 DIAGNOSIS — R9431 Abnormal electrocardiogram [ECG] [EKG]: Secondary | ICD-10-CM | POA: Diagnosis present

## 2020-01-23 DIAGNOSIS — E871 Hypo-osmolality and hyponatremia: Secondary | ICD-10-CM | POA: Diagnosis not present

## 2020-01-23 DIAGNOSIS — Z87891 Personal history of nicotine dependence: Secondary | ICD-10-CM | POA: Diagnosis not present

## 2020-01-23 DIAGNOSIS — Z6829 Body mass index (BMI) 29.0-29.9, adult: Secondary | ICD-10-CM

## 2020-01-23 DIAGNOSIS — K5730 Diverticulosis of large intestine without perforation or abscess without bleeding: Secondary | ICD-10-CM | POA: Diagnosis not present

## 2020-01-23 DIAGNOSIS — D123 Benign neoplasm of transverse colon: Secondary | ICD-10-CM | POA: Diagnosis not present

## 2020-01-23 DIAGNOSIS — K2210 Ulcer of esophagus without bleeding: Secondary | ICD-10-CM | POA: Diagnosis not present

## 2020-01-23 DIAGNOSIS — K635 Polyp of colon: Secondary | ICD-10-CM | POA: Diagnosis present

## 2020-01-23 DIAGNOSIS — D125 Benign neoplasm of sigmoid colon: Secondary | ICD-10-CM | POA: Diagnosis not present

## 2020-01-23 DIAGNOSIS — E46 Unspecified protein-calorie malnutrition: Secondary | ICD-10-CM | POA: Diagnosis present

## 2020-01-23 DIAGNOSIS — H9190 Unspecified hearing loss, unspecified ear: Secondary | ICD-10-CM | POA: Diagnosis present

## 2020-01-23 DIAGNOSIS — Z7982 Long term (current) use of aspirin: Secondary | ICD-10-CM | POA: Diagnosis not present

## 2020-01-23 DIAGNOSIS — J189 Pneumonia, unspecified organism: Secondary | ICD-10-CM | POA: Diagnosis not present

## 2020-01-23 DIAGNOSIS — Z79899 Other long term (current) drug therapy: Secondary | ICD-10-CM | POA: Diagnosis not present

## 2020-01-23 DIAGNOSIS — D509 Iron deficiency anemia, unspecified: Principal | ICD-10-CM | POA: Diagnosis present

## 2020-01-23 DIAGNOSIS — E8809 Other disorders of plasma-protein metabolism, not elsewhere classified: Secondary | ICD-10-CM | POA: Diagnosis present

## 2020-01-23 DIAGNOSIS — I779 Disorder of arteries and arterioles, unspecified: Secondary | ICD-10-CM | POA: Diagnosis present

## 2020-01-23 DIAGNOSIS — R634 Abnormal weight loss: Secondary | ICD-10-CM | POA: Diagnosis not present

## 2020-01-23 LAB — COMPREHENSIVE METABOLIC PANEL
ALT: 31 U/L (ref 0–44)
AST: 30 U/L (ref 15–41)
Albumin: 1.9 g/dL — ABNORMAL LOW (ref 3.5–5.0)
Alkaline Phosphatase: 150 U/L — ABNORMAL HIGH (ref 38–126)
Anion gap: 8 (ref 5–15)
BUN: 17 mg/dL (ref 8–23)
CO2: 31 mmol/L (ref 22–32)
Calcium: 7.9 mg/dL — ABNORMAL LOW (ref 8.9–10.3)
Chloride: 91 mmol/L — ABNORMAL LOW (ref 98–111)
Creatinine, Ser: 0.82 mg/dL (ref 0.61–1.24)
GFR calc Af Amer: >60 mL/min (ref >60)
GFR calc non Af Amer: >60 mL/min (ref >60)
Glucose, Bld: 111 mg/dL — ABNORMAL HIGH (ref 70–99)
Potassium: 3.7 mmol/L (ref 3.5–5.1)
Sodium: 130 mmol/L — ABNORMAL LOW (ref 135–145)
Total Bilirubin: 0.5 mg/dL (ref 0.3–1.2)
Total Protein: 6.5 g/dL (ref 6.5–8.1)

## 2020-01-23 LAB — CBC WITH DIFFERENTIAL/PLATELET
Abs Immature Granulocytes: 0.13 10*3/uL — ABNORMAL HIGH (ref 0.00–0.07)
Basophils Absolute: 0 10*3/uL (ref 0.0–0.1)
Basophils Relative: 0 %
Eosinophils Absolute: 0.3 10*3/uL (ref 0.0–0.5)
Eosinophils Relative: 2 %
HCT: 30.4 % — ABNORMAL LOW (ref 39.0–52.0)
Hemoglobin: 9.5 g/dL — ABNORMAL LOW (ref 13.0–17.0)
Immature Granulocytes: 1 %
Lymphocytes Relative: 7 %
Lymphs Abs: 1.3 10*3/uL (ref 0.7–4.0)
MCH: 28.3 pg (ref 26.0–34.0)
MCHC: 31.3 g/dL (ref 30.0–36.0)
MCV: 90.5 fL (ref 80.0–100.0)
Monocytes Absolute: 1.3 10*3/uL — ABNORMAL HIGH (ref 0.1–1.0)
Monocytes Relative: 7 %
Neutro Abs: 15.3 10*3/uL — ABNORMAL HIGH (ref 1.7–7.7)
Neutrophils Relative %: 83 %
Platelets: 429 10*3/uL — ABNORMAL HIGH (ref 150–400)
RBC: 3.36 MIL/uL — ABNORMAL LOW (ref 4.22–5.81)
RDW: 13.8 % (ref 11.5–15.5)
WBC: 18.4 10*3/uL — ABNORMAL HIGH (ref 4.0–10.5)
nRBC: 0 % (ref 0.0–0.2)

## 2020-01-23 LAB — MAGNESIUM: Magnesium: 1.9 mg/dL (ref 1.7–2.4)

## 2020-01-23 LAB — URINALYSIS, ROUTINE W REFLEX MICROSCOPIC
Bilirubin Urine: NEGATIVE
Glucose, UA: NEGATIVE mg/dL
Hgb urine dipstick: NEGATIVE
Ketones, ur: NEGATIVE mg/dL
Leukocytes,Ua: NEGATIVE
Nitrite: NEGATIVE
Protein, ur: NEGATIVE mg/dL
Specific Gravity, Urine: 1.017 (ref 1.005–1.030)
pH: 7 (ref 5.0–8.0)

## 2020-01-23 LAB — LACTIC ACID, PLASMA
Lactic Acid, Venous: 1.4 mmol/L (ref 0.5–1.9)
Lactic Acid, Venous: 1.2 mmol/L (ref 0.5–1.9)

## 2020-01-23 LAB — TSH: TSH: 1.414 u[IU]/mL (ref 0.350–4.500)

## 2020-01-23 LAB — HEMOGLOBIN AND HEMATOCRIT, BLOOD
HCT: 28.8 % — ABNORMAL LOW (ref 39.0–52.0)
Hemoglobin: 9.3 g/dL — ABNORMAL LOW (ref 13.0–17.0)

## 2020-01-23 MED ORDER — ONDANSETRON HCL 4 MG/2ML IJ SOLN: 4.0000 mg | Freq: Four times a day (QID) | INTRAMUSCULAR | Status: DC | PRN

## 2020-01-23 MED ORDER — SODIUM CHLORIDE 0.9 % IV BOLUS
1000.0000 mL | Freq: Once | INTRAVENOUS | Status: AC
  Administered 2020-01-23: 13:00:00 1000 mL via INTRAVENOUS

## 2020-01-23 MED ORDER — SODIUM CHLORIDE 0.9 % IV SOLN
510.0000 mg | Freq: Once | INTRAVENOUS | Status: AC
  Administered 2020-01-23: 510 mg via INTRAVENOUS
  Filled 2020-01-23: qty 17

## 2020-01-23 MED ORDER — PROCHLORPERAZINE EDISYLATE 10 MG/2ML IJ SOLN: 5.0000 mg | INTRAMUSCULAR | Status: DC | PRN

## 2020-01-23 MED ORDER — DOCUSATE SODIUM 100 MG PO CAPS
100.0000 mg | ORAL_CAPSULE | Freq: Two times a day (BID) | ORAL | Status: DC
  Administered 2020-01-23 – 2020-01-25 (×5): 100 mg via ORAL
  Filled 2020-01-23 (×6): qty 1

## 2020-01-23 MED ORDER — ACETAMINOPHEN 650 MG RE SUPP: 650.0000 mg | Freq: Four times a day (QID) | RECTAL | Status: DC | PRN

## 2020-01-23 MED ORDER — MAGNESIUM SULFATE 2 GM/50ML IV SOLN
2.0000 g | Freq: Once | INTRAVENOUS | Status: AC
  Administered 2020-01-23: 22:00:00 2 g via INTRAVENOUS
  Filled 2020-01-23: qty 50

## 2020-01-23 MED ORDER — METOPROLOL TARTRATE 50 MG PO TABS
50.0000 mg | ORAL_TABLET | Freq: Two times a day (BID) | ORAL | Status: DC
  Administered 2020-01-23 – 2020-01-24 (×2): 50 mg via ORAL
  Filled 2020-01-23 (×2): qty 1

## 2020-01-23 MED ORDER — ONDANSETRON HCL 4 MG PO TABS: 4.0000 mg | ORAL_TABLET | Freq: Four times a day (QID) | ORAL | Status: DC | PRN

## 2020-01-23 MED ORDER — IOHEXOL 300 MG/ML  SOLN
75.0000 mL | Freq: Once | INTRAMUSCULAR | Status: AC | PRN
  Administered 2020-01-23: 17:00:00 75 mL via INTRAVENOUS

## 2020-01-23 MED ORDER — ATORVASTATIN CALCIUM 20 MG PO TABS
20.0000 mg | ORAL_TABLET | Freq: Every day | ORAL | Status: DC
  Administered 2020-01-24 – 2020-01-25 (×2): 20 mg via ORAL
  Filled 2020-01-23 (×3): qty 1

## 2020-01-23 MED ORDER — POTASSIUM CHLORIDE CRYS ER 20 MEQ PO TBCR
20.0000 meq | EXTENDED_RELEASE_TABLET | Freq: Once | ORAL | Status: AC
  Administered 2020-01-23: 22:00:00 20 meq via ORAL
  Filled 2020-01-23: qty 1

## 2020-01-23 MED ORDER — ACETAMINOPHEN 325 MG PO TABS
650.0000 mg | ORAL_TABLET | Freq: Four times a day (QID) | ORAL | Status: DC | PRN
  Administered 2020-01-24: 21:00:00 650 mg via ORAL
  Filled 2020-01-23: qty 2

## 2020-01-23 MED ORDER — BENZONATATE 100 MG PO CAPS: 100.0000 mg | ORAL_CAPSULE | Freq: Four times a day (QID) | ORAL | Status: DC | PRN

## 2020-01-23 NOTE — ED Notes (Signed)
Pt hearing aids were place in a dentures container and was given to wife at bedside.

## 2020-01-23 NOTE — H&P (Signed)
History and Physical    [name]:[id number] DOB: 1935/08/02 DOA: 01/23/2020  PCP: [name], MD   Patient coming from: Home.  I have personally briefly reviewed patient's old medical records in [facility]  Chief Complaint: Weakness.  HPI: Jeffrey Ho is a 85 y.o. male with medical history significant of right eye blindness, carotid artery occlusion, impaired hearing, hyperlipidemia, hypertension who was admitted from 12/31/2019 until 01/02/2020 due to community-acquired pneumonia and ischemic changes on EKG. the patient states that since he has been discharged he has been very weak, dyspneic with exertion, somnolent and fatigue.  He mentions that according to his wife he has not been eating like usual.  He denies fever, chills, rhinorrhea, sore throat, but has been having a dry cough.  He denies chest pain, palpitations, dizziness, diaphoresis, PND, orthopnea or pitting edema of the lower extremities.  He has some lower abdomen pain yesterday, but no longer there on palpation.  He denies nausea, emesis, diarrhea, constipation, melena or hematochezia.  No flank pain, dysuria, frequency or hematuria.  Denies polyuria, polydipsia, polyphagia or blurred vision.  ED Course: Initial vital signs temperature 98.1 F, pulse 83, respirations 20, blood pressure 121/74 mmHg and O2 sat 100% on room air.  The patient received a 1000 mL NS bolus and GI was consulted.  GI will evaluate in the morning.  Urinalysis is negative.  CBC shows a white count of 18.4 with 83% neutrophils, hemoglobin 9.5 g/dL and platelets 812.  His lactic acid was normal twice.  Sodium 130 and chloride 91 mmol/L.  All other electrolytes are within normal range when calcium is corrected to albumin.  Renal function is normal.  Glucose 111 mg/dL, but this is a nonfasting measurement.  LFTs show an albumin of 1.9 g/dL and alkaline phosphatase of 150 units/L.  The rest of the hepatic functions are within normal  limits.  Imaging: A 1 view chest radiograph did not show any acute abnormalities.  CT chest with contrast show that the right upper lobe pneumonia has improved since the previous CT.  There is a small right pleural effusion which has also improved since last radiographic evaluation.  Mild bronchial wall thickening most consistent with bronchitis.  Cholelithiasis.  Review of Systems: As per HPI otherwise all other systems reviewed and are negative.  Past Medical History:  Diagnosis Date  . Blindness of right eye   . Carotid artery occlusion   . HOH (hard of hearing)   . Hyperlipidemia   . Hypertension     Past Surgical History:  Procedure Laterality Date  . CAROTID ENDARTERECTOMY     left CEA  06/24.2010  . CAROTID ENDARTERECTOMY     right CEA  02/20/2009  . EYE SURGERY Left Nov. 2015   Cataract    Social History  reports that he quit smoking about 50 years ago. He has never used smokeless tobacco. He reports that he does not drink alcohol or use drugs.  No Known Allergies  Family History  Problem Relation Age of Onset  . Heart disease Father        Before age 26   Prior to Admission medications   Medication Sig Start Date End Date Taking? Authorizing Provider  aspirin 81 MG tablet Take 81 mg by mouth daily.   Yes [provider]  atorvastatin (LIPITOR) 20 MG tablet 1 tablet daily. 05/07/16  Yes [provider]  docusate sodium (COLACE) 100 MG capsule Take 1 capsule (100 mg total) by  mouth 2 (two) times daily. 01/02/20 01/01/21 Yes Shah, Pratik D, DO  ferrous sulfate 325 (65 FE) MG tablet Take 1 tablet (325 mg total) by mouth daily. Patient taking differently: Take 325 mg by mouth 2 (two) times daily with a meal.  01/02/20 01/01/21 Yes Shah, Pratik D, DO  fish oil-omega-3 fatty acids 1000 MG capsule Take 1 g by mouth daily.    Yes [provider]  hydrochlorothiazide (HYDRODIURIL) 25 MG tablet Take 25 mg by mouth daily.   Yes [provider]   metoprolol (LOPRESSOR) 50 MG tablet Take 50 mg by mouth 2 (two) times daily.  08/08/12  Yes [provider]  niacin 500 MG tablet Take 500 mg by mouth daily with breakfast.   Yes [provider]  vitamin C (ASCORBIC ACID) 250 MG tablet Take 1 tablet (250 mg total) by mouth daily. Take with iron supplementation. 01/02/20 02/01/20 Yes Shah, Pratik D, DO  benzonatate (TESSALON) 100 MG capsule Take 100 mg by mouth 4 (four) times daily as needed for cough.  12/07/19   [provider]    Physical Exam: Vitals:   01/23/20 1845 01/23/20 1900 01/23/20 1930 01/23/20 2000  BP:  (!) 119/56 (!) 121/46 (!) 110/59  Pulse: 100 100 (!) 104 99  Resp: (!) 31 (!) 24 (!) 26 (!) 24  Temp:      TempSrc:      SpO2: 96% 95% 98% 99%  Weight:      Height:        Constitutional: NAD, calm, comfortable Eyes: PERRL, lids and conjunctivae are pale. ENMT: Mucous membranes are moist. Posterior pharynx clear of any exudate or lesions.  Neck: normal, supple, no masses, no thyromegaly Respiratory: Clear to auscultation bilaterally, no wheezing, no crackles. Normal respiratory effort. No accessory muscle use.  Cardiovascular: Regular rate and rhythm, no murmurs / rubs / gallops.  1+ lower extremities pitting edema. 2+ pedal pulses. No carotid bruits.  Abdomen: Nondistended.  BS positive.  Soft, no tenderness, no masses palpated. No hepatosplenomegaly. Musculoskeletal: Generalized weakness.  No clubbing / cyanosis. Good ROM, no contractures. Normal muscle tone.  Skin: Skin looks pale. Neurologic: CN 2-12 grossly intact. Sensation intact, DTR normal. Strength 5/5 in all 4.  Psychiatric: Normal judgment and insight. Alert and oriented x 3. Normal mood.   Labs on Admission: I have personally reviewed following labs and imaging studies  CBC: Recent Labs  Lab 01/23/20 1319 01/23/20 1941  WBC 18.4*  --   NEUTROABS 15.3*  --   HGB 9.5* 9.3*  HCT 30.4* 28.8*  MCV 90.5  --   PLT 429*  --      Basic Metabolic Panel: Recent Labs  Lab 01/23/20 1319  NA 130*  K 3.7  CL 91*  CO2 31  GLUCOSE 111*  BUN 17  CREATININE 0.82  CALCIUM 7.9*  MG 1.9   GFR: Estimated Creatinine Clearance: 84.1 mL/min (by C-G formula based on SCr of 0.82 mg/dL).  Liver Function Tests: Recent Labs  Lab 01/23/20 1319  AST 30  ALT 31  ALKPHOS 150*  BILITOT 0.5  PROT 6.5  ALBUMIN 1.9*   Urine analysis:    Component Value Date/Time   COLORURINE YELLOW 01/23/2020 1404   APPEARANCEUR CLEAR 01/23/2020 1404   LABSPEC 1.017 01/23/2020 1404   PHURINE 7.0 01/23/2020 1404   GLUCOSEU NEGATIVE 01/23/2020 1404   HGBUR NEGATIVE 01/23/2020 1404   BILIRUBINUR NEGATIVE 01/23/2020 1404   KETONESUR NEGATIVE 01/23/2020 1404   PROTEINUR NEGATIVE 01/23/2020  1404   UROBILINOGEN 0.2 03/28/2009 0611   NITRITE NEGATIVE 01/23/2020 1404   LEUKOCYTESUR NEGATIVE 01/23/2020 1404   Radiological Exams on Admission: CT Chest W Contrast  Result Date: 01/23/2020 CLINICAL DATA:  85 year old male with generalized weakness. EXAM: CT CHEST WITH CONTRAST TECHNIQUE: Multidetector CT imaging of the chest was performed during intravenous contrast administration. CONTRAST:  29mL OMNIPAQUE IOHEXOL 300 MG/ML  SOLN COMPARISON:  Chest radiograph dated 01/23/2020. Chest CT dated 12/29/2019 FINDINGS: Cardiovascular: There is no cardiomegaly or pericardial effusion. Coronary vascular calcification primarily involving the LAD and left circumflex artery. There is moderate atherosclerotic calcification of the thoracic aorta. No aneurysmal dilatation or dissection. Evaluation of the pulmonary arteries is limited due to suboptimal opacification. No definite pulmonary artery embolus identified. Mediastinum/Nodes: There is no hilar or mediastinal adenopathy. Right hilar lymph node measures 7 mm in short axis. The esophagus is grossly unremarkable. No mediastinal fluid collection. Lungs/Pleura: There is a small right pleural effusion, decreased  in size since the prior CT. Right upper lobe streaky and nodular densities most consistent with pneumonia. Overall improvement of the right upper lobe densities since the prior CT. Clinical correlation and continued follow-up recommended. Additional nodular densities in the left apex and left lower lobe. There is no pneumothorax. The central airways are patent. There is mild bronchial wall thickening most consistent with bronchitis. Biapical scarring noted. Upper Abdomen: Small gallstones. Musculoskeletal: Degenerative changes of the spine. No acute osseous pathology. IMPRESSION: 1. Right upper lobe pneumonia, improved since the prior CT. Clinical correlation and continued follow-up recommended. 2. Small right pleural effusion, decreased in size since the prior CT. 3. Mild bronchial wall thickening most consistent with bronchitis. 4. Cholelithiasis. 5. Aortic Atherosclerosis (ICD10-I70.0). Electronically Signed   By: Anner Crete M.D.   On: 01/23/2020 17:23   DG Chest Portable 1 View  Result Date: 01/23/2020 CLINICAL DATA:  Progressive weakness. Recent history of pneumonia. EXAM: PORTABLE CHEST 1 VIEW COMPARISON:  Chest x-ray dated 01/19/2020 and chest CT dated 12/29/2019 FINDINGS: The heart size and pulmonary vascularity are normal. Aortic atherosclerosis. Chronic blunting of the right costophrenic angle laterally. Chronic slight parenchymal scarring in the right midzone. Left lung is clear. No acute bone abnormality. Moderate arthritic changes of both glenohumeral joints. IMPRESSION: 1. No acute abnormalities. 2.  Aortic Atherosclerosis (ICD10-I70.0). Electronically Signed   By: Lorriane Shire M.D.   On: 01/23/2020 13:45   Echocardiogram 01/01/2020  IMPRESSIONS   1. Left ventricular ejection fraction, by estimation, is 60 to 65%. The  left ventricle has normal function. The left ventricle has no regional  wall motion abnormalities. There is mild left ventricular hypertrophy.  Left ventricular  diastolic parameters  were normal.  2. Right ventricular systolic function is normal. The right ventricular  size is normal.  3. The mitral valve is normal in structure. No evidence of mitral valve  regurgitation. No evidence of mitral stenosis.  4. The aortic valve is tricuspid. Aortic valve regurgitation is not  visualized. Mild aortic valve stenosis.  5. The inferior vena cava is normal in size with greater than 50%  respiratory variability, suggesting right atrial pressure of 3 mmHg.   EKG: Independently reviewed. Vent. rate 78 BPM PR interval * ms QRS duration 106 ms QT/QTc 527/601 ms P-R-T axes 62 41 71 Sinus rhythm Atrial premature complex Abnormal R-wave progression, early transition Borderline T wave abnormalities Prolonged QT interval  Assessment/Plan Principal Problem:   Symptomatic iron deficiency anemia Observation/telemetry. Monitor H&H Transfuse as needed. GI evaluation in the morning.  Active Problems:   Heme positive stool during last admission. Taking iron since discharge home. Consult gastroenterology in a.m. Keep n.p.o. after midnight.    Prolonged QT interval QT/QTc 527/601 ms Magnesium sulfate 2 g IVPB. Optimize potassium level. Avoid QT prolonging medications.    Hyponatremia This may be contributing to weakness. Received a 1000 mL NS bolus in ED. Hold hydrochlorothiazide. Follow-up sodium level.    HTN (hypertension) Continue metoprolol 50 mg p.o. twice daily. Hold hydrochlorothiazide. Monitor blood pressure, heart rate and electrolytes.    Carotid artery disease without cerebral infarction (HCC) Hold aspirin. Continue statin.    Hyperlipidemia Continue atorvastatin 20 mg p.o. daily.    Hypoalbuminemia due to moderate protein-calorie malnutrition (HCC) Consult nutritionist.    Leukocytosis Around previous discharge baseline. Has radiographic infiltrate improvement and no fever. Follow WBC in a.m.   DVT  prophylaxis: SCDs Code Status:   Full code. Family Communication: Disposition Plan:   Patient is from:  Home.  Anticipated DC to:  Home.  Anticipated DC date:  01/24/2020 or 01/25/2020.  Anticipated DC barriers: GI evaluation and clinical improvement  Consults called:  Routine gastroenterology consult in a.m. Admission status:  Observation/telemetry.    Severity of Illness: Moderate severity.   Bobette Mo MD [facility]  How to contact the [facility] Attending or Consulting provider 7A - 7P or covering provider during after hours 7P -7A, for this patient?   1. Check the care team in [facility] and look for a) attending/consulting [facility] provider listed and b) the [facility] team listed 2. Log into [email or web address] and use [employer]'s universal password to access. If you do not have the password, please contact the hospital operator. 3. Locate the [facility] provider you are looking for under [facility] and page to a number that you can be directly reached. 4. If you still have difficulty reaching the provider, please page the [facility] (Director on Call) for the Hospitalists listed on amion for assistance.  01/23/2020, 8:24 PM   This document was prepared using Dragon voice recognition software and may contain some unintended transcription errors.

## 2020-01-23 NOTE — ED Provider Notes (Signed)
[facility] Provider Note   CSN: [id number] Arrival date & time: 01/23/20  1103     History Chief Complaint  Patient presents with  . Weakness    Jeffrey Ho is a 85 y.o. male.  HPI   Pt is a 85 y/o male with a h/o right blindness, carotid artery occlusion, hard of hearing, HLD, HTN who presents to the ED today for eval of abnormal lab. Pts wife is at bedside and assists with the history. She states he had blood work completed by his PCP and was noted to have an elevated WBC count at 24k and was told to come to the ED.   She states that patient has been generally weak for at least the last 2 months.   States that he has not been eating and drinking.  Has had chills and constipation after starting iron pills.  Denies fevers, chest pain, shortness of breath, significant cough, abd pain, nausea, vomiting, diarrhea, dysuria, hematuria, frequency.   On recent hosp admission, he was noted to have heme + stool. Has GI f/u this week. He is not sure if he is having any melena or bloody stools as he has been on iron supplementation.   Past Medical History:  Diagnosis Date  . Blindness of right eye   . Carotid artery occlusion   . HOH (hard of hearing)   . Hyperlipidemia   . Hypertension     Patient Active Problem List   Diagnosis Date Noted  . Symptomatic anemia 01/23/2020  . HTN (hypertension) 12/31/2019  . Carotid artery disease without cerebral infarction ([place]) 12/31/2019  . Cardiac ischemia 12/31/2019  . CAP (community acquired pneumonia) 12/31/2019  . Hyperlipidemia 12/31/2019  . Heme positive stool 12/31/2019  . Occlusion and stenosis of carotid artery without mention of cerebral infarction 08/22/2012    Past Surgical History:  Procedure Laterality Date  . CAROTID ENDARTERECTOMY     left CEA  06/24.2010  . CAROTID ENDARTERECTOMY     right CEA  02/20/2009  . EYE SURGERY Left Nov. 2015   Cataract       Family History  Problem Relation Age  of Onset  . Heart disease Father        Before age 85    Social History   Tobacco Use  . Smoking status: Former Smoker    Quit date: 08/10/1969    Years since quitting: 50.4  . Smokeless tobacco: Never Used  Substance Use Topics  . Alcohol use: No  . Drug use: No    Home Medications Prior to Admission medications   Medication Sig Start Date End Date Taking? Authorizing Provider  aspirin 81 MG tablet Take 81 mg by mouth daily.   Yes [provider]  atorvastatin (LIPITOR) 20 MG tablet 1 tablet daily. 05/07/16  Yes [provider]  docusate sodium (COLACE) 100 MG capsule Take 1 capsule (100 mg total) by mouth 2 (two) times daily. 01/02/20 01/01/21 Yes Shah, Pratik D, DO  ferrous sulfate 325 (65 FE) MG tablet Take 1 tablet (325 mg total) by mouth daily. Patient taking differently: Take 325 mg by mouth 2 (two) times daily with a meal.  01/02/20 01/01/21 Yes Shah, Pratik D, DO  fish oil-omega-3 fatty acids 1000 MG capsule Take 1 g by mouth daily.    Yes [provider]  hydrochlorothiazide (HYDRODIURIL) 25 MG tablet Take 25 mg by mouth daily.   Yes [provider]  metoprolol (LOPRESSOR) 50 MG tablet Take  50 mg by mouth 2 (two) times daily.  08/08/12  Yes [provider]  niacin 500 MG tablet Take 500 mg by mouth daily with breakfast.   Yes [provider]  vitamin C (ASCORBIC ACID) 250 MG tablet Take 1 tablet (250 mg total) by mouth daily. Take with iron supplementation. 01/02/20 02/01/20 Yes Shah, Pratik D, DO  benzonatate (TESSALON) 100 MG capsule Take 100 mg by mouth 4 (four) times daily as needed for cough.  12/07/19   [provider]    Allergies    Patient has no known allergies.  Review of Systems   Review of Systems  Constitutional: Positive for chills. Negative for fever.  HENT: Negative for ear pain and sore throat.   Eyes: Negative for visual disturbance.  Respiratory: Negative for cough and shortness of breath.     Cardiovascular: Negative for chest pain.  Gastrointestinal: Positive for constipation. Negative for abdominal pain, diarrhea, nausea and vomiting.  Genitourinary: Negative for dysuria, frequency and hematuria.  Musculoskeletal: Negative for back pain and neck pain.  Skin: Negative for rash.  Neurological: Positive for weakness. Negative for dizziness, light-headedness, numbness and headaches.  All other systems reviewed and are negative.   Physical Exam Updated Vital Signs BP (!) 121/46   Pulse (!) 104   Temp 98.1 F (36.7 C) (Oral)   Resp (!) 26   Ht 6' (1.829 m)   Wt 97.5 kg   SpO2 98%   BMI 29.16 kg/m   Physical Exam Vitals and nursing note reviewed.  Constitutional:      Appearance: He is well-developed. He is not ill-appearing.  HENT:     Head: Normocephalic and atraumatic.     Mouth/Throat:     Mouth: Mucous membranes are moist.  Eyes:     Conjunctiva/sclera: Conjunctivae normal.  Cardiovascular:     Rate and Rhythm: Normal rate and regular rhythm.     Heart sounds: Normal heart sounds. No murmur.  Pulmonary:     Effort: Pulmonary effort is normal. No respiratory distress.     Breath sounds: Normal breath sounds. No wheezing, rhonchi or rales.  Abdominal:     General: Bowel sounds are normal.     Palpations: Abdomen is soft.     Tenderness: There is no abdominal tenderness. There is no guarding or rebound.  Musculoskeletal:     Cervical back: Neck supple.  Skin:    General: Skin is warm and dry.  Neurological:     Mental Status: He is alert.     Comments: Mental Status:  Alert, thought content appropriate, able to give a coherent history. Speech fluent without evidence of aphasia. Able to follow 2 step commands without difficulty.  Cranial Nerves:  II: pupils equal, round, reactive to light III,IV, VI: ptosis not present, extra-ocular motions intact bilaterally  V,VII: smile symmetric, facial light touch sensation equal VIII: hearing grossly normal to  voice  X: uvula elevates symmetrically  XI: bilateral shoulder shrug symmetric and strong XII: midline tongue extension without fassiculations Motor:  Normal tone. 5/5 strength of BUE and BLE major muscle groups including strong and equal grip strength and dorsiflexion/plantar flexion Sensory: light touch normal in all extremities.     ED Results / Procedures / Treatments   Labs (all labs ordered are listed, but only abnormal results are displayed) Labs Reviewed  CBC WITH DIFFERENTIAL/PLATELET - Abnormal; Notable for the following components:      Result Value   WBC 18.4 (*)    RBC  3.36 (*)    Hemoglobin 9.5 (*)    HCT 30.4 (*)    Platelets 429 (*)    Neutro Abs 15.3 (*)    Monocytes Absolute 1.3 (*)    Abs Immature Granulocytes 0.13 (*)    All other components within normal limits  COMPREHENSIVE METABOLIC PANEL - Abnormal; Notable for the following components:   Sodium 130 (*)    Chloride 91 (*)    Glucose, Bld 111 (*)    Calcium 7.9 (*)    Albumin 1.9 (*)    Alkaline Phosphatase 150 (*)    All other components within normal limits  CULTURE, BLOOD (ROUTINE X 2)  CULTURE, BLOOD (ROUTINE X 2)  SARS CORONAVIRUS 2 (TAT 6-24 HRS)  LACTIC ACID, PLASMA  LACTIC ACID, PLASMA  URINALYSIS, ROUTINE W REFLEX MICROSCOPIC  MAGNESIUM  HEMOGLOBIN AND HEMATOCRIT, BLOOD  HEMOGLOBIN AND HEMATOCRIT, BLOOD  TSH  CBC  COMPREHENSIVE METABOLIC PANEL    EKG EKG Interpretation  Date/Time:  Tuesday January 23 2020 12:54:50 EDT Ventricular Rate:  78 PR Interval:    QRS Duration: 106 QT Interval:  527 QTC Calculation: 601 R Axis:   41 Text Interpretation: Sinus rhythm Atrial premature complex Abnormal R-wave progression, early transition Borderline T wave abnormalities Confirmed by Raeford Razor [phone]) on 01/23/2020 2:28:30 PM   Radiology CT Chest W Contrast  Result Date: 01/23/2020 CLINICAL DATA:  84 year old male with generalized weakness. EXAM: CT CHEST WITH CONTRAST TECHNIQUE:  Multidetector CT imaging of the chest was performed during intravenous contrast administration. CONTRAST:  72mL OMNIPAQUE IOHEXOL 300 MG/ML  SOLN COMPARISON:  Chest radiograph dated 01/23/2020. Chest CT dated 12/29/2019 FINDINGS: Cardiovascular: There is no cardiomegaly or pericardial effusion. Coronary vascular calcification primarily involving the LAD and left circumflex artery. There is moderate atherosclerotic calcification of the thoracic aorta. No aneurysmal dilatation or dissection. Evaluation of the pulmonary arteries is limited due to suboptimal opacification. No definite pulmonary artery embolus identified. Mediastinum/Nodes: There is no hilar or mediastinal adenopathy. Right hilar lymph node measures 7 mm in short axis. The esophagus is grossly unremarkable. No mediastinal fluid collection. Lungs/Pleura: There is a small right pleural effusion, decreased in size since the prior CT. Right upper lobe streaky and nodular densities most consistent with pneumonia. Overall improvement of the right upper lobe densities since the prior CT. Clinical correlation and continued follow-up recommended. Additional nodular densities in the left apex and left lower lobe. There is no pneumothorax. The central airways are patent. There is mild bronchial wall thickening most consistent with bronchitis. Biapical scarring noted. Upper Abdomen: Small gallstones. Musculoskeletal: Degenerative changes of the spine. No acute osseous pathology. IMPRESSION: 1. Right upper lobe pneumonia, improved since the prior CT. Clinical correlation and continued follow-up recommended. 2. Small right pleural effusion, decreased in size since the prior CT. 3. Mild bronchial wall thickening most consistent with bronchitis. 4. Cholelithiasis. 5. Aortic Atherosclerosis (ICD10-I70.0). Electronically Signed   By: Elgie Collard M.D.   On: 01/23/2020 17:23   DG Chest Portable 1 View  Result Date: 01/23/2020 CLINICAL DATA:  Progressive weakness.  Recent history of pneumonia. EXAM: PORTABLE CHEST 1 VIEW COMPARISON:  Chest x-ray dated 01/19/2020 and chest CT dated 12/29/2019 FINDINGS: The heart size and pulmonary vascularity are normal. Aortic atherosclerosis. Chronic blunting of the right costophrenic angle laterally. Chronic slight parenchymal scarring in the right midzone. Left lung is clear. No acute bone abnormality. Moderate arthritic changes of both glenohumeral joints. IMPRESSION: 1. No acute abnormalities. 2.  Aortic Atherosclerosis (ICD10-I70.0). Electronically Signed  By: Francene Boyers M.D.   On: 01/23/2020 13:45    Procedures Procedures (including critical care time)  Medications Ordered in ED Medications  ondansetron (ZOFRAN) tablet 4 mg (has no administration in time range)    Or  ondansetron (ZOFRAN) injection 4 mg (has no administration in time range)  acetaminophen (TYLENOL) tablet 650 mg (has no administration in time range)    Or  acetaminophen (TYLENOL) suppository 650 mg (has no administration in time range)  sodium chloride 0.9 % bolus 1,000 mL (0 mLs Intravenous Stopped 01/23/20 1533)  ferumoxytol (FERAHEME) 510 mg in sodium chloride 0.9 % 100 mL IVPB (0 mg Intravenous Stopped 01/23/20 1715)  iohexol (OMNIPAQUE) 300 MG/ML solution 75 mL (75 mLs Intravenous Contrast Given 01/23/20 1642)    ED Course  I have reviewed the triage vital signs and the nursing notes.  Pertinent labs & imaging results that were available during my care of the patient were reviewed by me and considered in my medical decision making (see chart for details).    MDM Rules/Calculators/A&P                      85 y/o M with generalized weakness for several months. Sent by pcp for elevated WBC count.   Somewhat tachypneic but otherwise VS wnl. Exam is generally benign. Will check labs, cxr, ua, ekg.  CBC with leukocytosis and persistent anemia that appears stable from prior labs  CMP with hyponatremia, low albumin. Otherwise  reassuring Mag wnl Lactic acid neg Blood cultures obtained  EKG with sinus rhythm Atrial premature complex Abnormal R-wave progression, early transition Borderline T wave abnormalities   CXR 1. No acute abnormalities. 2.  Aortic Atherosclerosis (ICD10-I70.0)  CT chest with improving RUL PNA and improving pleural effusion. Bronchitis noted.   Reviewed prior records, pt admitted last month for CAP. Was started on augmentin. Also noted to have anemia with heme positive stool. Has GI f/u this week. Family is very concerned about discharge home as patient has been significantly weak and is unable to tolerate oral iron. They are concerned about him being discharged home and are hoping to have his GI eval expediated. Myself and Dr. Juleen China had a long discussed with the family and pt about findings today. Discussed that we would be happy to consult GI but cannot guarantee that a scope would be completed during admission. Discussed that we would also speak with hospitalist service about admission. They are also requesting that we give him an iron infusion. Dr. Juleen China advises that this can be given in the ED.   6:30 PM CONSULT with Dr. Darrick Penna with gastroenterology who states that if patient is admitted then GI will consult on the patient.   7:17 PM CONSULT with Dr. Robb Matar who accepts patient for admission.   Final Clinical Impression(s) / ED Diagnoses Final diagnoses:  None    Rx / DC Orders ED Discharge Orders    None       Rayne Du 01/23/20 1944    Raeford Razor, MD 01/24/20 [phone]

## 2020-01-23 NOTE — ED Provider Notes (Signed)
Medical screening examination/treatment/procedure(s) were conducted as a shared visit with non-physician practitioner(s) and myself.  I personally evaluated the patient during the encounter.  EKG Interpretation  Date/Time:  Tuesday January 23 2020 12:54:50 EDT Ventricular Rate:  78 PR Interval:    QRS Duration: 106 QT Interval:  527 QTC Calculation: 601 R Axis:   41 Text Interpretation: Sinus rhythm Atrial premature complex Abnormal R-wave progression, early transition Borderline T wave abnormalities Confirmed by Raeford Razor [phone]) on 01/23/2020 2:28:30 PM  84yM with generalized weakness, fatigue. This has been persistent and progressive since discharge on 01/02/20 after being admitted with pneumonia. Had heme positive stools and has GI appointment this Friday. Stool has been dark but taking iron. Anemia has been fairly stable since the time of discharge. He has a persistent leukocytosis/neutrophilia though. Afebrile. W/u otherwise stable/unremarkable.   Family very concerned about his weakness. It could be from several different things. I am not sure of the chronicity of his anemia. Prior to admission, the last labs I have are from 3 years ago. This certainly could be playing a role. Also could be deconditioned after recent pneumonia/hospitalization. I offered to have care management speak with them with regards to possible home health services/physicial therapy. They are not interested in this.   I spoke with his son-in-law Dr Harlene Salts [phone]. He is an Administrator, arts in [place]. He describes a drastic change in patient from a couple months ago. Normally walks a lot and is active. He is pretty sure the the patient does not have a history of anemia. He is concerned that his appetite has been very poor and he hasn't tolerated the oral iron. He also has concerns about persistent leukocytosis despite finishing another course of antibiotics (levaquin) after he was discharged. He hasn't been  on steroids as far as he knows. He questioned iron infusions, EGD, repeat CT to look for occult infection, persistent mucus plugging, etc.  I think all his and his family's thoughts and concerns are legitimate. I do not have the same level of urgency they do with regards to continuing to work it up though. I explained that we can repeat imaging and discuss about possibly admitting him again but the admitting providers may disagree with the appropriateness of it. They also questioned about transferring him to a higher level of care. I explained he can seek medical care wherever he pleases but there is no medical necessity to for me to transfer him.    Raeford Razor, MD 01/23/20 [phone]

## 2020-01-23 NOTE — ED Triage Notes (Signed)
Pt states he was diagnosed with pneumonia x 3 weeks ago and spent a couple of nights in the hospital; pt states since his discharge he has been having increasing weakness; pt states he was told his hemoglobin is low; pt states when he was in the hospital he was told he has some blood in his stools, pt has an upcoming appointment this week with Dr. Karilyn Cota

## 2020-01-23 NOTE — ED Notes (Signed)
C/o weakness.  Wife at bedside.

## 2020-01-24 DIAGNOSIS — Z8249 Family history of ischemic heart disease and other diseases of the circulatory system: Secondary | ICD-10-CM | POA: Diagnosis not present

## 2020-01-24 DIAGNOSIS — Z8711 Personal history of peptic ulcer disease: Secondary | ICD-10-CM | POA: Diagnosis not present

## 2020-01-24 DIAGNOSIS — R634 Abnormal weight loss: Secondary | ICD-10-CM

## 2020-01-24 DIAGNOSIS — K449 Diaphragmatic hernia without obstruction or gangrene: Secondary | ICD-10-CM | POA: Diagnosis present

## 2020-01-24 DIAGNOSIS — R531 Weakness: Secondary | ICD-10-CM

## 2020-01-24 DIAGNOSIS — K640 First degree hemorrhoids: Secondary | ICD-10-CM | POA: Diagnosis present

## 2020-01-24 DIAGNOSIS — Z79899 Other long term (current) drug therapy: Secondary | ICD-10-CM | POA: Diagnosis not present

## 2020-01-24 DIAGNOSIS — E440 Moderate protein-calorie malnutrition: Secondary | ICD-10-CM | POA: Diagnosis present

## 2020-01-24 DIAGNOSIS — K2100 Gastro-esophageal reflux disease with esophagitis, without bleeding: Secondary | ICD-10-CM | POA: Diagnosis present

## 2020-01-24 DIAGNOSIS — Z87891 Personal history of nicotine dependence: Secondary | ICD-10-CM | POA: Diagnosis not present

## 2020-01-24 DIAGNOSIS — E785 Hyperlipidemia, unspecified: Secondary | ICD-10-CM | POA: Diagnosis present

## 2020-01-24 DIAGNOSIS — Z8701 Personal history of pneumonia (recurrent): Secondary | ICD-10-CM | POA: Diagnosis not present

## 2020-01-24 DIAGNOSIS — Q438 Other specified congenital malformations of intestine: Secondary | ICD-10-CM | POA: Diagnosis not present

## 2020-01-24 DIAGNOSIS — D509 Iron deficiency anemia, unspecified: Secondary | ICD-10-CM | POA: Diagnosis present

## 2020-01-24 DIAGNOSIS — Z20822 Contact with and (suspected) exposure to covid-19: Secondary | ICD-10-CM | POA: Diagnosis present

## 2020-01-24 DIAGNOSIS — D649 Anemia, unspecified: Secondary | ICD-10-CM

## 2020-01-24 DIAGNOSIS — E871 Hypo-osmolality and hyponatremia: Secondary | ICD-10-CM | POA: Diagnosis present

## 2020-01-24 DIAGNOSIS — K635 Polyp of colon: Secondary | ICD-10-CM | POA: Diagnosis present

## 2020-01-24 DIAGNOSIS — H9190 Unspecified hearing loss, unspecified ear: Secondary | ICD-10-CM | POA: Diagnosis present

## 2020-01-24 DIAGNOSIS — I10 Essential (primary) hypertension: Secondary | ICD-10-CM | POA: Diagnosis present

## 2020-01-24 DIAGNOSIS — K5730 Diverticulosis of large intestine without perforation or abscess without bleeding: Secondary | ICD-10-CM | POA: Diagnosis present

## 2020-01-24 DIAGNOSIS — K8020 Calculus of gallbladder without cholecystitis without obstruction: Secondary | ICD-10-CM | POA: Diagnosis present

## 2020-01-24 DIAGNOSIS — K2210 Ulcer of esophagus without bleeding: Secondary | ICD-10-CM | POA: Diagnosis present

## 2020-01-24 DIAGNOSIS — Z7982 Long term (current) use of aspirin: Secondary | ICD-10-CM | POA: Diagnosis not present

## 2020-01-24 DIAGNOSIS — Z6829 Body mass index (BMI) 29.0-29.9, adult: Secondary | ICD-10-CM | POA: Diagnosis not present

## 2020-01-24 DIAGNOSIS — H5461 Unqualified visual loss, right eye, normal vision left eye: Secondary | ICD-10-CM | POA: Diagnosis present

## 2020-01-24 LAB — CBC
HCT: 29.6 % — ABNORMAL LOW (ref 39.0–52.0)
Hemoglobin: 9.3 g/dL — ABNORMAL LOW (ref 13.0–17.0)
MCH: 28.4 pg (ref 26.0–34.0)
MCHC: 31.4 g/dL (ref 30.0–36.0)
MCV: 90.2 fL (ref 80.0–100.0)
Platelets: 464 10*3/uL — ABNORMAL HIGH (ref 150–400)
RBC: 3.28 MIL/uL — ABNORMAL LOW (ref 4.22–5.81)
RDW: 14.1 % (ref 11.5–15.5)
WBC: 21 10*3/uL — ABNORMAL HIGH (ref 4.0–10.5)
nRBC: 0 % (ref 0.0–0.2)

## 2020-01-24 LAB — COMPREHENSIVE METABOLIC PANEL
ALT: 32 U/L (ref 0–44)
AST: 35 U/L (ref 15–41)
Albumin: 1.7 g/dL — ABNORMAL LOW (ref 3.5–5.0)
Alkaline Phosphatase: 154 U/L — ABNORMAL HIGH (ref 38–126)
Anion gap: 10 (ref 5–15)
BUN: 16 mg/dL (ref 8–23)
CO2: 27 mmol/L (ref 22–32)
Calcium: 7.8 mg/dL — ABNORMAL LOW (ref 8.9–10.3)
Chloride: 91 mmol/L — ABNORMAL LOW (ref 98–111)
Creatinine, Ser: 0.84 mg/dL (ref 0.61–1.24)
GFR calc Af Amer: >60 mL/min (ref >60)
GFR calc non Af Amer: >60 mL/min (ref >60)
Glucose, Bld: 106 mg/dL — ABNORMAL HIGH (ref 70–99)
Potassium: 3.4 mmol/L — ABNORMAL LOW (ref 3.5–5.1)
Sodium: 128 mmol/L — ABNORMAL LOW (ref 135–145)
Total Bilirubin: 0.7 mg/dL (ref 0.3–1.2)
Total Protein: 6.4 g/dL — ABNORMAL LOW (ref 6.5–8.1)

## 2020-01-24 LAB — TYPE AND SCREEN
ABO/RH(D): A POS
Antibody Screen: NEGATIVE

## 2020-01-24 LAB — SARS CORONAVIRUS 2 (TAT 6-24 HRS): SARS Coronavirus 2: NEGATIVE

## 2020-01-24 LAB — HEMOGLOBIN AND HEMATOCRIT, BLOOD
HCT: 27.2 % — ABNORMAL LOW (ref 39.0–52.0)
Hemoglobin: 8.8 g/dL — ABNORMAL LOW (ref 13.0–17.0)

## 2020-01-24 LAB — FOLATE: Folate: 4 ng/mL — ABNORMAL LOW (ref >5.9)

## 2020-01-24 LAB — FERRITIN: Ferritin: 1910 ng/mL — ABNORMAL HIGH (ref 24–336)

## 2020-01-24 LAB — IRON AND TIBC
Iron: 114 ug/dL (ref 45–182)
Saturation Ratios: 83 % — ABNORMAL HIGH (ref 17.9–39.5)
TIBC: 137 ug/dL — ABNORMAL LOW (ref 250–450)
UIBC: 23 ug/dL

## 2020-01-24 LAB — RETICULOCYTES
Immature Retic Fract: 28 % — ABNORMAL HIGH (ref 2.3–15.9)
RBC.: 3.06 MIL/uL — ABNORMAL LOW (ref 4.22–5.81)
Retic Count, Absolute: 62.1 10*3/uL (ref 19.0–186.0)
Retic Ct Pct: 2 % (ref 0.4–3.1)

## 2020-01-24 LAB — OSMOLALITY: Osmolality: 269 mOsm/kg — ABNORMAL LOW (ref 275–295)

## 2020-01-24 LAB — VITAMIN B12: Vitamin B-12: 354 pg/mL (ref 180–914)

## 2020-01-24 MED ORDER — BISACODYL 5 MG PO TBEC
10.0000 mg | DELAYED_RELEASE_TABLET | Freq: Once | ORAL | Status: AC
  Administered 2020-01-24: 21:00:00 10 mg via ORAL
  Filled 2020-01-24: qty 2

## 2020-01-24 MED ORDER — BOOST / RESOURCE BREEZE PO LIQD CUSTOM: 1.0000 | Freq: Three times a day (TID) | ORAL | Status: DC

## 2020-01-24 MED ORDER — FERROUS SULFATE 325 (65 FE) MG PO TABS
325.0000 mg | ORAL_TABLET | Freq: Two times a day (BID) | ORAL | Status: DC
  Administered 2020-01-24 – 2020-01-25 (×2): 325 mg via ORAL
  Filled 2020-01-24 (×2): qty 1

## 2020-01-24 MED ORDER — PEG 3350-KCL-NA BICARB-NACL 420 G PO SOLR
4000.0000 mL | Freq: Once | ORAL | Status: AC
  Administered 2020-01-25: 05:00:00 4000 mL via ORAL

## 2020-01-24 MED ORDER — SODIUM CHLORIDE 0.9 % IV SOLN: INTRAVENOUS | Status: AC

## 2020-01-24 MED ORDER — METOPROLOL TARTRATE 50 MG PO TABS
25.0000 mg | ORAL_TABLET | Freq: Two times a day (BID) | ORAL | Status: DC
  Administered 2020-01-24 – 2020-01-25 (×2): 25 mg via ORAL
  Filled 2020-01-24 (×2): qty 1

## 2020-01-24 MED ORDER — POTASSIUM CHLORIDE CRYS ER 20 MEQ PO TBCR
40.0000 meq | EXTENDED_RELEASE_TABLET | Freq: Once | ORAL | Status: AC
  Administered 2020-01-24: 40 meq via ORAL
  Filled 2020-01-24: qty 2

## 2020-01-24 MED ORDER — ASCORBIC ACID 500 MG PO TABS
250.0000 mg | ORAL_TABLET | Freq: Every day | ORAL | Status: DC
  Administered 2020-01-24 – 2020-01-25 (×2): 250 mg via ORAL
  Filled 2020-01-24 (×3): qty 1

## 2020-01-24 NOTE — Consult Note (Signed)
Referring Provider: Pratik Shah,DO Primary Care Physician:  [name], MD Primary Gastroenterologist:  Dr. Karilyn Cota  Reason for Consultation:   Anemia and heme positive stool.  HPI:   Patient is 85 year old Caucasian male who was in usual state of health until about 3-1/2 weeks ago when he was hospitalized for pneumonia.  Patient was discharged on 01/02/2020.  During that admission patient was noted to be anemic.  He did not have any evidence of overt GI bleed.  It was decided for him to undergo outpatient work-up.  Patient was scheduled to be seen by Ms. Lewie Loron, NP on 01/26/2020.  However patient was feeling so poorly and extremely weak to the point that he could not even do much walking he decided to come back to emergency room.  He was noted to have hemoglobin of 9.5 g.  3 weeks earlier was 10.1.  However WBC was 18.4.  Comprehensive chemistry panel was pertinent for hyponatremia and profound hypoalbuminemia with albumin of 1.9 g and his alkaline phosphatase was 150.  Rest of the chemistry panel was unremarkable.  Covid testing was negative.  Serum lactic acid was normal.  Chest CT revealed resolving right upper lobe pneumonia.  Right pleural effusion had decreased and now minimal.  He also had mild bronchial wall thickening cholelithiasis and aortic atherosclerosis. Patient was admitted and GI consultation requested so that work-up could be performed as soon as possible. Patient denies melena or rectal bleeding or hematemesis.  He has very poor appetite.  He has nausea virtually every day.  His wife who is at bedside states that he has been using antiemetic twice a day.  He denies heartburn dysphagia or abdominal pain.  Few days before admission he was constipated and was able to take stool softener and have a bowel movement.  He has lost 19 pounds since he was diagnosed with pneumonia.  His wife states he eats very little.  He is on low-dose aspirin but does not take other OTC NSAIDs.  He says  he has had maybe 3 colonoscopies and does not remember ever having had polyps. He denies shortness of breath or productive cough.  He is able to walk to the bathroom without getting short of breath. He says before he got sick he would walk several blocks every day.  Now he is unable to do so. He does have remote history of peptic ulcer disease for which I saw him many years ago.  He retired from [employer] 30 years ago after having worked for 30 years.  He has 3 children from his first marriage which ended up in divorce.  Both her sons and daughter are in good health.  He has 2 stepchildren.  He quit cigarette smoking at age 85 after having smoked for 5 years about a pack a day.  He does not drink alcohol.  Father died at age 50 either due to MI or diabetes mellitus.  Mother lived to be 24.  She was treated for unknown malignancy.  He had 9 brothers and 5 sisters and they are all gone but 2 sisters.  1 brother died of metastatic carcinoma at age 30 primary unknown 1 brother died of brain cancer at age 52 and another brother died of brain cancer at age 79.  1 sister died of lung carcinoma.  Past Medical History:  Diagnosis Date  . Blindness of right eye   . Carotid artery occlusion   . HOH (hard of hearing)   . Hyperlipidemia   .  Hypertension     Past Surgical History:  Procedure Laterality Date  . CAROTID ENDARTERECTOMY     left CEA  06/24.2010  . CAROTID ENDARTERECTOMY     right CEA  02/20/2009  . EYE SURGERY Left Nov. 2015   Cataract    Prior to Admission medications   Medication Sig Start Date End Date Taking? Authorizing Provider  aspirin 81 MG tablet Take 81 mg by mouth daily.   Yes [provider]  atorvastatin (LIPITOR) 20 MG tablet 1 tablet daily. 05/07/16  Yes [provider]  docusate sodium (COLACE) 100 MG capsule Take 1 capsule (100 mg total) by mouth 2 (two) times daily. 01/02/20 01/01/21 Yes Shah, Pratik D, DO  ferrous sulfate 325 (65 FE) MG  tablet Take 1 tablet (325 mg total) by mouth daily. Patient taking differently: Take 325 mg by mouth 2 (two) times daily with a meal.  01/02/20 01/01/21 Yes Shah, Pratik D, DO  fish oil-omega-3 fatty acids 1000 MG capsule Take 1 g by mouth daily.    Yes [provider]  hydrochlorothiazide (HYDRODIURIL) 25 MG tablet Take 25 mg by mouth daily.   Yes [provider]  metoprolol (LOPRESSOR) 50 MG tablet Take 50 mg by mouth 2 (two) times daily.  08/08/12  Yes [provider]  niacin 500 MG tablet Take 500 mg by mouth daily with breakfast.   Yes [provider]  vitamin C (ASCORBIC ACID) 250 MG tablet Take 1 tablet (250 mg total) by mouth daily. Take with iron supplementation. 01/02/20 02/01/20 Yes Shah, Pratik D, DO  benzonatate (TESSALON) 100 MG capsule Take 100 mg by mouth 4 (four) times daily as needed for cough.  12/07/19   [provider]    Current Facility-Administered Medications  Medication Dose Route Frequency Provider Last Rate Last Admin  . 0.9 %  sodium chloride infusion   Intravenous Continuous Heath Lark D, DO 75 mL/hr at 01/24/20 0949 New Bag at 01/24/20 0949  . acetaminophen (TYLENOL) tablet 650 mg  650 mg Oral Q6H PRN Reubin Milan, MD       Or  . acetaminophen (TYLENOL) suppository 650 mg  650 mg Rectal Q6H PRN Reubin Milan, MD      . ascorbic acid (VITAMIN C) tablet 250 mg  250 mg Oral Daily Manuella Ghazi, Pratik D, DO   250 mg at 01/24/20 1703  . atorvastatin (LIPITOR) tablet 20 mg  20 mg Oral Daily Reubin Milan, MD   20 mg at 01/24/20 0744  . benzonatate (TESSALON) capsule 100 mg  100 mg Oral QID PRN Reubin Milan, MD      . docusate sodium (COLACE) capsule 100 mg  100 mg Oral BID Reubin Milan, MD   100 mg at 01/24/20 0743  . feeding supplement (BOOST / RESOURCE BREEZE) liquid 1 Container  1 Container Oral TID BM Shah, Pratik D, DO      . ferrous sulfate tablet 325 mg  325 mg Oral BID WC Shah, Pratik D, DO   325 mg  at 01/24/20 1703  . metoprolol tartrate (LOPRESSOR) tablet 25 mg  25 mg Oral BID Manuella Ghazi, Pratik D, DO      . prochlorperazine (COMPAZINE) injection 5 mg  5 mg Intravenous Q4H PRN Reubin Milan, MD        Allergies as of 01/23/2020  . (No Known Allergies)    Family History  Problem Relation Age of Onset  . Heart disease Father  Before age 80    Social History   Socioeconomic History  . Marital status: Married    Spouse name: Not on file  . Number of children: Not on file  . Years of education: Not on file  . Highest education level: Not on file  Occupational History  . Not on file  Tobacco Use  . Smoking status: Former Smoker    Quit date: 08/10/1969    Years since quitting: 50.4  . Smokeless tobacco: Never Used  Substance and Sexual Activity  . Alcohol use: No  . Drug use: No  . Sexual activity: Not on file  Other Topics Concern  . Not on file  Social History Narrative  . Not on file   Social Determinants of Health   Financial Resource Strain:   . Difficulty of Paying Living Expenses:   Food Insecurity:   . Worried About Programme researcher, broadcasting/film/video in the Last Year:   . Barista in the Last Year:   Transportation Needs:   . Freight forwarder (Medical):   Marland Kitchen Lack of Transportation (Non-Medical):   Physical Activity:   . Days of Exercise per Week:   . Minutes of Exercise per Session:   Stress:   . Feeling of Stress :   Social Connections:   . Frequency of Communication with Friends and Family:   . Frequency of Social Gatherings with Friends and Family:   . Attends Religious Services:   . Active Member of Clubs or Organizations:   . Attends Banker Meetings:   Marland Kitchen Marital Status:   Intimate Partner Violence:   . Fear of Current or Ex-Partner:   . Emotionally Abused:   Marland Kitchen Physically Abused:   . Sexually Abused:     Review of Systems: See HPI, otherwise normal ROS  Physical Exam: Temp:  [97.8 F (36.6 C)-99.6 F (37.6 C)] 98.3  F (36.8 C) (04/21 1639) Pulse Rate:  [75-107] 86 (04/21 1639) Resp:  [17-31] 20 (04/21 1639) BP: (104-136)/(46-75) 129/75 (04/21 1639) SpO2:  [93 %-100 %] 98 % (04/21 1639) Last BM Date: 01/20/20  Patient is alert and in no acute distress. He has hearing impairment. He is blind in right eye.   Conjunctiva is pale.  Sclerae nonicteric. Oropharyngeal mucosa is normal.  Dentition in satisfactory condition. No neck masses or thyromegaly noted.  He has left carotid endarterectomy scar. Cardiac exam with regular rhythm normal S1 and S2.  He has faint systolic murmur heard at aortic area as well as upper left sternal border. Auscultation of lungs reveal vesicular breath sounds bilaterally. Abdomen is full.  Bowel sounds are normal.  He has faint abdominal bruit heard in mid abdomen to the right of midline.  On palpation abdomen is soft and nontender with organomegaly or masses. He does not have peripheral edema clubbing or koilonychia.  Lab Results: Recent Labs    01/23/20 1319 01/23/20 1319 01/23/20 1941 01/24/20 0117 01/24/20 0802  WBC 18.4*  --   --   --  21.0*  HGB 9.5*   < > 9.3* 8.8* 9.3*  HCT 30.4*   < > 28.8* 27.2* 29.6*  PLT 429*  --   --   --  464*   < > = values in this interval not displayed.   BMET Recent Labs    01/23/20 1319 01/24/20 0802  NA 130* 128*  K 3.7 3.4*  CL 91* 91*  CO2 31 27  GLUCOSE 111* 106*  BUN 17  16  CREATININE 0.82 0.84  CALCIUM 7.9* 7.8*   LFT Recent Labs    01/24/20 0802  PROT 6.4*  ALBUMIN 1.7*  AST 35  ALT 32  ALKPHOS 154*  BILITOT 0.7   Folate level 4.0. B12 level 354.  Blood cultures at 24 hours are negative  Studies/Results: CT Chest W Contrast  Result Date: 01/23/2020 CLINICAL DATA:  85 year old male with generalized weakness. EXAM: CT CHEST WITH CONTRAST TECHNIQUE: Multidetector CT imaging of the chest was performed during intravenous contrast administration. CONTRAST:  79mL OMNIPAQUE IOHEXOL 300 MG/ML  SOLN  COMPARISON:  Chest radiograph dated 01/23/2020. Chest CT dated 12/29/2019 FINDINGS: Cardiovascular: There is no cardiomegaly or pericardial effusion. Coronary vascular calcification primarily involving the LAD and left circumflex artery. There is moderate atherosclerotic calcification of the thoracic aorta. No aneurysmal dilatation or dissection. Evaluation of the pulmonary arteries is limited due to suboptimal opacification. No definite pulmonary artery embolus identified. Mediastinum/Nodes: There is no hilar or mediastinal adenopathy. Right hilar lymph node measures 7 mm in short axis. The esophagus is grossly unremarkable. No mediastinal fluid collection. Lungs/Pleura: There is a small right pleural effusion, decreased in size since the prior CT. Right upper lobe streaky and nodular densities most consistent with pneumonia. Overall improvement of the right upper lobe densities since the prior CT. Clinical correlation and continued follow-up recommended. Additional nodular densities in the left apex and left lower lobe. There is no pneumothorax. The central airways are patent. There is mild bronchial wall thickening most consistent with bronchitis. Biapical scarring noted. Upper Abdomen: Small gallstones. Musculoskeletal: Degenerative changes of the spine. No acute osseous pathology. IMPRESSION: 1. Right upper lobe pneumonia, improved since the prior CT. Clinical correlation and continued follow-up recommended. 2. Small right pleural effusion, decreased in size since the prior CT. 3. Mild bronchial wall thickening most consistent with bronchitis. 4. Cholelithiasis. 5. Aortic Atherosclerosis (ICD10-I70.0). Electronically Signed   By: Elgie Collard M.D.   On: 01/23/2020 17:23   DG Chest Portable 1 View  Result Date: 01/23/2020 CLINICAL DATA:  Progressive weakness. Recent history of pneumonia. EXAM: PORTABLE CHEST 1 VIEW COMPARISON:  Chest x-ray dated 01/19/2020 and chest CT dated 12/29/2019 FINDINGS: The  heart size and pulmonary vascularity are normal. Aortic atherosclerosis. Chronic blunting of the right costophrenic angle laterally. Chronic slight parenchymal scarring in the right midzone. Left lung is clear. No acute bone abnormality. Moderate arthritic changes of both glenohumeral joints. IMPRESSION: 1. No acute abnormalities. 2.  Aortic Atherosclerosis (ICD10-I70.0). Electronically Signed   By: Francene Boyers M.D.   On: 01/23/2020 13:45    Assessment;  Patient is 85 year old Caucasian male who was recently hospitalized(3 weeks ago) for pneumonia when he was noted to be anemic and had heme positive stool.  Patient presented again yesterday with progressive weakness.  His weakness was felt to be due to anemia.  He was therefore hospitalized for further evaluation.  No history of melena or rectal bleeding.  Patient has anorexia nausea and 19 pound weight loss.  He has remote history of peptic ulcer disease.  He is on low-dose aspirin and recent illness could have triggered peptic ulcer disease.  Last colonoscopy was several years ago.   Iron studies do not confirm iron deficiency anemia but he does have folate deficiency which may be contributing to his anemia. Differential diagnosis includes peptic ulcer disease or neoplasm of upper or lower GI tract.  Folate deficiency appears to be nutritional.  Profound weakness cannot be explained on the basis of low hemoglobin  alone.  He does have mild hyponatremia.  Need to rule out Addison's disease.  Hypoalbuminemia with appear to be due to malnutrition and diminished oral intake.  Urine analysis from yesterday negative for proteinuria.  Leukocytosis.  Urine analysis was unremarkable and blood cultures are negative.  Recent hospitalization for pneumonia 3 weeks ago with he does not appear to have active infection.  Therefore no explanation for leukocytosis unless he has occult neoplasm.  Cholelithiasis appear to be asymptomatic.  He has nausea but no  other typical symptoms.  If no other cause found for his nausea may consider HIDA scan.  Recommendations;  Fasting cortisol level in a.m. INR with a.m. lab. Dulcolax 2 tablets tonight. Begin GoLYTELY tomorrow morning at 6 AM at a rate of 4 ounces by mouth every 5 minutes until finished. Patient will undergo diagnostic esophagogastroduodenoscopy and colonoscopy by Dr. Jena Gaussourk tomorrow.   LOS: 0 days   Lyfe Reihl  01/24/2020, 6:01 PM

## 2020-01-24 NOTE — Progress Notes (Addendum)
PROGRESS NOTE    Jeffrey Ho  PVX:[id number] DOB: 1935-04-27 DOA: 01/23/2020 PCP: [name], MD   Brief Narrative:  Per HPI: Jeffrey Ho is a 85 y.o. male with medical history significant of right eye blindness, carotid artery occlusion, impaired hearing, hyperlipidemia, hypertension who was admitted from 12/31/2019 until 01/02/2020 due to community-acquired pneumonia and ischemic changes on EKG. the patient states that since he has been discharged he has been very weak, dyspneic with exertion, somnolent and fatigue.  He mentions that according to his wife he has not been eating like usual.  He denies fever, chills, rhinorrhea, sore throat, but has been having a dry cough.  He denies chest pain, palpitations, dizziness, diaphoresis, PND, orthopnea or pitting edema of the lower extremities.  He has some lower abdomen pain yesterday, but no longer there on palpation.  He denies nausea, emesis, diarrhea, constipation, melena or hematochezia.  No flank pain, dysuria, frequency or hematuria.  Denies polyuria, polydipsia, polyphagia or blurred vision.  4/21: Patient was admitted with persistent weakness with questionable symptomatic iron deficiency anemia as well as some noted hyponatremia.  He has not had any overt bleeding and hemoglobin levels appear to remain stable.  GI evaluation pending.  Continue on IV normal saline with further evaluation of hyponatremia ordered and pending.  Assessment & Plan:   Principal Problem:   Symptomatic anemia Active Problems:   HTN (hypertension)   Carotid artery disease without cerebral infarction (HCC)   Hyperlipidemia   Heme positive stool   Prolonged QT interval   Hyponatremia   Hypoalbuminemia due to protein-calorie malnutrition (HCC)   Leukocytosis   Generalized weakness associated with poor appetite -Question if this is related to symptomatic iron deficiency anemia versus hyponatremia versus other cause -We will cut metoprolol dose to 25  mg twice daily for now as his lower blood pressure readings may be partly to blame -Appreciate GI evaluation to consider colonoscopy -Recent 2D echocardiogram with no significant findings -TSH noted to be 1.4 -B12 of 354 -PT evaluation ordered  Iron deficiency anemia -Positive heme in stool previously and was supposed to see GI on 4/23 outpatient setting -Currently appears stable -Follow CBC with no overt bleeding noted -Resume home oral supplementation and check iron panel  Hyponatremia -Start IV normal saline for time-limited -Evaluation ordered with serum and urine osmolarity -May be related to home hydrochlorothiazide which has been discontinued -TSH WNL -Recheck in am  Hypertension-stable -Continue metoprolol 50 mg p.o. twice daily and hold HCTZ  Carotid artery disease -Hold aspirin and continue statin  Dyslipidemia -Continue atorvastatin 20 mg p.o. daily  Hypoalbuminemia with moderate protein calorie malnutrition -Appreciate dietitian evaluation  Persistent leukocytosis -No signs of infection currently noted -Follow repeat CBC -Check peripheral smear  DVT prophylaxis: SCDs Code Status: Full code Family Communication: Wife at bedside Disposition Plan: Appreciate GI evaluation along with PT evaluation.  Continue on IV normal saline for hyponatremia with studies pending.  Repeat a.m. labs.  May require placement prior to discharge.   Consultants:   GI  Procedures:   None  Antimicrobials:   None   Subjective: Patient seen and evaluated today with no new acute complaints or concerns. No acute concerns or events noted overnight.  He states that he continues to have concerns of weakness.  Objective: Vitals:   01/24/20 0445 01/24/20 0747 01/24/20 0845 01/24/20 1217  BP: (!) 108/56 (!) 107/52 110/62   Pulse: 80 80 78   Resp: 17  18   Temp: 98.4 F (  36.9 C)  98 F (36.7 C)   TempSrc: Oral  Oral   SpO2: 100%  99% 96%  Weight:      Height:         Intake/Output Summary (Last 24 hours) at 01/24/2020 1234 Last data filed at 01/24/2020 0900 Gross per 24 hour  Intake 1340 ml  Output --  Net 1340 ml   Filed Weights   01/23/20 1126  Weight: 97.5 kg    Examination:  General exam: Appears calm and comfortable  Respiratory system: Clear to auscultation. Respiratory effort normal.  Currently on nasal cannula oxygen. Cardiovascular system: S1 & S2 heard, RRR. No JVD, murmurs, rubs, gallops or clicks. No pedal edema. Gastrointestinal system: Abdomen is nondistended, soft and nontender. No organomegaly or masses felt. Normal bowel sounds heard. Central nervous system: Alert and oriented. No focal neurological deficits. Extremities: Symmetric 5 x 5 power. Skin: No rashes, lesions or ulcers Psychiatry: Judgement and insight appear normal. Mood & affect appropriate.     Data Reviewed: I have personally reviewed following labs and imaging studies  CBC: Recent Labs  Lab 01/23/20 1319 01/23/20 1941 01/24/20 0117 01/24/20 0802  WBC 18.4*  --   --  21.0*  NEUTROABS 15.3*  --   --   --   HGB 9.5* 9.3* 8.8* 9.3*  HCT 30.4* 28.8* 27.2* 29.6*  MCV 90.5  --   --  90.2  PLT 429*  --   --  464*   Basic Metabolic Panel: Recent Labs  Lab 01/23/20 1319 01/24/20 0802  NA 130* 128*  K 3.7 3.4*  CL 91* 91*  CO2 31 27  GLUCOSE 111* 106*  BUN 17 16  CREATININE 0.82 0.84  CALCIUM 7.9* 7.8*  MG 1.9  --    GFR: Estimated Creatinine Clearance: 82.1 mL/min (by C-G formula based on SCr of 0.84 mg/dL). Liver Function Tests: Recent Labs  Lab 01/23/20 1319 01/24/20 0802  AST 30 35  ALT 31 32  ALKPHOS 150* 154*  BILITOT 0.5 0.7  PROT 6.5 6.4*  ALBUMIN 1.9* 1.7*   No results for input(s): LIPASE, AMYLASE in the last 168 hours. No results for input(s): AMMONIA in the last 168 hours. Coagulation Profile: No results for input(s): INR, PROTIME in the last 168 hours. Cardiac Enzymes: No results for input(s): CKTOTAL, CKMB,  CKMBINDEX, TROPONINI in the last 168 hours. BNP (last 3 results) No results for input(s): PROBNP in the last 8760 hours. HbA1C: No results for input(s): HGBA1C in the last 72 hours. CBG: No results for input(s): GLUCAP in the last 168 hours. Lipid Profile: No results for input(s): CHOL, HDL, LDLCALC, TRIG, CHOLHDL, LDLDIRECT in the last 72 hours. Thyroid Function Tests: Recent Labs    01/23/20 1945  TSH 1.414   Anemia Panel: Recent Labs    01/24/20 0802  VITAMINB12 354   Sepsis Labs: Recent Labs  Lab 01/23/20 1319 01/23/20 1538  LATICACIDVEN 1.4 1.2    Recent Results (from the past 240 hour(s))  Blood culture (routine x 2)     Status: None (Preliminary result)   Collection Time: 01/23/20  5:30 PM   Specimen: Left Antecubital; Blood  Result Value Ref Range Status   Specimen Description LEFT ANTECUBITAL  Final   Special Requests   Final    BOTTLES DRAWN AEROBIC AND ANAEROBIC Blood Culture adequate volume   Culture   Final    NO GROWTH < 24 HOURS Performed at [facility], [address] [place], [place] [address]  Report Status PENDING  Incomplete  Blood culture (routine x 2)     Status: None (Preliminary result)   Collection Time: 01/23/20  5:30 PM   Specimen: BLOOD RIGHT HAND  Result Value Ref Range Status   Specimen Description BLOOD RIGHT HAND  Final   Special Requests   Final    BOTTLES DRAWN AEROBIC AND ANAEROBIC Blood Culture adequate volume   Culture   Final    NO GROWTH < 24 HOURS Performed at [facility], [address], [place], [place] [address]    Report Status PENDING  Incomplete         Radiology Studies: CT Chest W Contrast  Result Date: 01/23/2020 CLINICAL DATA:  85 year old male with generalized weakness. EXAM: CT CHEST WITH CONTRAST TECHNIQUE: Multidetector CT imaging of the chest was performed during intravenous contrast administration. CONTRAST:  63mL OMNIPAQUE IOHEXOL 300 MG/ML  SOLN COMPARISON:  Chest radiograph dated 01/23/2020.  Chest CT dated 12/29/2019 FINDINGS: Cardiovascular: There is no cardiomegaly or pericardial effusion. Coronary vascular calcification primarily involving the LAD and left circumflex artery. There is moderate atherosclerotic calcification of the thoracic aorta. No aneurysmal dilatation or dissection. Evaluation of the pulmonary arteries is limited due to suboptimal opacification. No definite pulmonary artery embolus identified. Mediastinum/Nodes: There is no hilar or mediastinal adenopathy. Right hilar lymph node measures 7 mm in short axis. The esophagus is grossly unremarkable. No mediastinal fluid collection. Lungs/Pleura: There is a small right pleural effusion, decreased in size since the prior CT. Right upper lobe streaky and nodular densities most consistent with pneumonia. Overall improvement of the right upper lobe densities since the prior CT. Clinical correlation and continued follow-up recommended. Additional nodular densities in the left apex and left lower lobe. There is no pneumothorax. The central airways are patent. There is mild bronchial wall thickening most consistent with bronchitis. Biapical scarring noted. Upper Abdomen: Small gallstones. Musculoskeletal: Degenerative changes of the spine. No acute osseous pathology. IMPRESSION: 1. Right upper lobe pneumonia, improved since the prior CT. Clinical correlation and continued follow-up recommended. 2. Small right pleural effusion, decreased in size since the prior CT. 3. Mild bronchial wall thickening most consistent with bronchitis. 4. Cholelithiasis. 5. Aortic Atherosclerosis (ICD10-I70.0). Electronically Signed   By: Anner Crete M.D.   On: 01/23/2020 17:23   DG Chest Portable 1 View  Result Date: 01/23/2020 CLINICAL DATA:  Progressive weakness. Recent history of pneumonia. EXAM: PORTABLE CHEST 1 VIEW COMPARISON:  Chest x-ray dated 01/19/2020 and chest CT dated 12/29/2019 FINDINGS: The heart size and pulmonary vascularity are normal.  Aortic atherosclerosis. Chronic blunting of the right costophrenic angle laterally. Chronic slight parenchymal scarring in the right midzone. Left lung is clear. No acute bone abnormality. Moderate arthritic changes of both glenohumeral joints. IMPRESSION: 1. No acute abnormalities. 2.  Aortic Atherosclerosis (ICD10-I70.0). Electronically Signed   By: Lorriane Shire M.D.   On: 01/23/2020 13:45        Scheduled Meds: . atorvastatin  20 mg Oral Daily  . docusate sodium  100 mg Oral BID  . metoprolol tartrate  50 mg Oral BID  . potassium chloride  40 mEq Oral Once   Continuous Infusions: . sodium chloride 75 mL/hr at 01/24/20 0949     LOS: 0 days    Time spent: 35 minutes    Lavern Maslow Darleen Crocker, DO [facility]  If 7PM-7AM, please contact night-coverage [email or web address] 01/24/2020, 12:34 PM

## 2020-01-24 NOTE — H&P (View-Only) (Signed)
Referring Provider: Pratik Shah,DO Primary Care Physician:  [name], MD Primary Gastroenterologist:  Dr. Karilyn Cota  Reason for Consultation:   Anemia and heme positive stool.  HPI:   Patient is 85 year old Caucasian male who was in usual state of health until about 3-1/2 weeks ago when he was hospitalized for pneumonia.  Patient was discharged on 01/02/2020.  During that admission patient was noted to be anemic.  He did not have any evidence of overt GI bleed.  It was decided for him to undergo outpatient work-up.  Patient was scheduled to be seen by Ms. Lewie Loron, NP on 01/26/2020.  However patient was feeling so poorly and extremely weak to the point that he could not even do much walking he decided to come back to emergency room.  He was noted to have hemoglobin of 9.5 g.  3 weeks earlier was 10.1.  However WBC was 18.4.  Comprehensive chemistry panel was pertinent for hyponatremia and profound hypoalbuminemia with albumin of 1.9 g and his alkaline phosphatase was 150.  Rest of the chemistry panel was unremarkable.  Covid testing was negative.  Serum lactic acid was normal.  Chest CT revealed resolving right upper lobe pneumonia.  Right pleural effusion had decreased and now minimal.  He also had mild bronchial wall thickening cholelithiasis and aortic atherosclerosis. Patient was admitted and GI consultation requested so that work-up could be performed as soon as possible. Patient denies melena or rectal bleeding or hematemesis.  He has very poor appetite.  He has nausea virtually every day.  His wife who is at bedside states that he has been using antiemetic twice a day.  He denies heartburn dysphagia or abdominal pain.  Few days before admission he was constipated and was able to take stool softener and have a bowel movement.  He has lost 19 pounds since he was diagnosed with pneumonia.  His wife states he eats very little.  He is on low-dose aspirin but does not take other OTC NSAIDs.  He says  he has had maybe 3 colonoscopies and does not remember ever having had polyps. He denies shortness of breath or productive cough.  He is able to walk to the bathroom without getting short of breath. He says before he got sick he would walk several blocks every day.  Now he is unable to do so. He does have remote history of peptic ulcer disease for which I saw him many years ago.  He retired from [employer] 30 years ago after having worked for 30 years.  He has 3 children from his first marriage which ended up in divorce.  Both her sons and daughter are in good health.  He has 2 stepchildren.  He quit cigarette smoking at age 85 after having smoked for 5 years about a pack a day.  He does not drink alcohol.  Father died at age 50 either due to MI or diabetes mellitus.  Mother lived to be 24.  She was treated for unknown malignancy.  He had 9 brothers and 5 sisters and they are all gone but 2 sisters.  1 brother died of metastatic carcinoma at age 30 primary unknown 1 brother died of brain cancer at age 52 and another brother died of brain cancer at age 79.  1 sister died of lung carcinoma.  Past Medical History:  Diagnosis Date  . Blindness of right eye   . Carotid artery occlusion   . HOH (hard of hearing)   . Hyperlipidemia   .  Hypertension     Past Surgical History:  Procedure Laterality Date  . CAROTID ENDARTERECTOMY     left CEA  06/24.2010  . CAROTID ENDARTERECTOMY     right CEA  02/20/2009  . EYE SURGERY Left Nov. 2015   Cataract    Prior to Admission medications   Medication Sig Start Date End Date Taking? Authorizing Provider  aspirin 81 MG tablet Take 81 mg by mouth daily.   Yes [provider]  atorvastatin (LIPITOR) 20 MG tablet 1 tablet daily. 05/07/16  Yes [provider]  docusate sodium (COLACE) 100 MG capsule Take 1 capsule (100 mg total) by mouth 2 (two) times daily. 01/02/20 01/01/21 Yes Shah, Pratik D, DO  ferrous sulfate 325 (65 FE) MG  tablet Take 1 tablet (325 mg total) by mouth daily. Patient taking differently: Take 325 mg by mouth 2 (two) times daily with a meal.  01/02/20 01/01/21 Yes Shah, Pratik D, DO  fish oil-omega-3 fatty acids 1000 MG capsule Take 1 g by mouth daily.    Yes [provider]  hydrochlorothiazide (HYDRODIURIL) 25 MG tablet Take 25 mg by mouth daily.   Yes [provider]  metoprolol (LOPRESSOR) 50 MG tablet Take 50 mg by mouth 2 (two) times daily.  08/08/12  Yes [provider]  niacin 500 MG tablet Take 500 mg by mouth daily with breakfast.   Yes [provider]  vitamin C (ASCORBIC ACID) 250 MG tablet Take 1 tablet (250 mg total) by mouth daily. Take with iron supplementation. 01/02/20 02/01/20 Yes Shah, Pratik D, DO  benzonatate (TESSALON) 100 MG capsule Take 100 mg by mouth 4 (four) times daily as needed for cough.  12/07/19   [provider]    Current Facility-Administered Medications  Medication Dose Route Frequency Provider Last Rate Last Admin  . 0.9 %  sodium chloride infusion   Intravenous Continuous Heath Lark D, DO 75 mL/hr at 01/24/20 0949 New Bag at 01/24/20 0949  . acetaminophen (TYLENOL) tablet 650 mg  650 mg Oral Q6H PRN Reubin Milan, MD       Or  . acetaminophen (TYLENOL) suppository 650 mg  650 mg Rectal Q6H PRN Reubin Milan, MD      . ascorbic acid (VITAMIN C) tablet 250 mg  250 mg Oral Daily Manuella Ghazi, Pratik D, DO   250 mg at 01/24/20 1703  . atorvastatin (LIPITOR) tablet 20 mg  20 mg Oral Daily Reubin Milan, MD   20 mg at 01/24/20 0744  . benzonatate (TESSALON) capsule 100 mg  100 mg Oral QID PRN Reubin Milan, MD      . docusate sodium (COLACE) capsule 100 mg  100 mg Oral BID Reubin Milan, MD   100 mg at 01/24/20 0743  . feeding supplement (BOOST / RESOURCE BREEZE) liquid 1 Container  1 Container Oral TID BM Shah, Pratik D, DO      . ferrous sulfate tablet 325 mg  325 mg Oral BID WC Shah, Pratik D, DO   325 mg  at 01/24/20 1703  . metoprolol tartrate (LOPRESSOR) tablet 25 mg  25 mg Oral BID Manuella Ghazi, Pratik D, DO      . prochlorperazine (COMPAZINE) injection 5 mg  5 mg Intravenous Q4H PRN Reubin Milan, MD        Allergies as of 01/23/2020  . (No Known Allergies)    Family History  Problem Relation Age of Onset  . Heart disease Father  Before age 80    Social History   Socioeconomic History  . Marital status: Married    Spouse name: Not on file  . Number of children: Not on file  . Years of education: Not on file  . Highest education level: Not on file  Occupational History  . Not on file  Tobacco Use  . Smoking status: Former Smoker    Quit date: 08/10/1969    Years since quitting: 50.4  . Smokeless tobacco: Never Used  Substance and Sexual Activity  . Alcohol use: No  . Drug use: No  . Sexual activity: Not on file  Other Topics Concern  . Not on file  Social History Narrative  . Not on file   Social Determinants of Health   Financial Resource Strain:   . Difficulty of Paying Living Expenses:   Food Insecurity:   . Worried About Programme researcher, broadcasting/film/video in the Last Year:   . Barista in the Last Year:   Transportation Needs:   . Freight forwarder (Medical):   Marland Kitchen Lack of Transportation (Non-Medical):   Physical Activity:   . Days of Exercise per Week:   . Minutes of Exercise per Session:   Stress:   . Feeling of Stress :   Social Connections:   . Frequency of Communication with Friends and Family:   . Frequency of Social Gatherings with Friends and Family:   . Attends Religious Services:   . Active Member of Clubs or Organizations:   . Attends Banker Meetings:   Marland Kitchen Marital Status:   Intimate Partner Violence:   . Fear of Current or Ex-Partner:   . Emotionally Abused:   Marland Kitchen Physically Abused:   . Sexually Abused:     Review of Systems: See HPI, otherwise normal ROS  Physical Exam: Temp:  [97.8 F (36.6 C)-99.6 F (37.6 C)] 98.3  F (36.8 C) (04/21 1639) Pulse Rate:  [75-107] 86 (04/21 1639) Resp:  [17-31] 20 (04/21 1639) BP: (104-136)/(46-75) 129/75 (04/21 1639) SpO2:  [93 %-100 %] 98 % (04/21 1639) Last BM Date: 01/20/20  Patient is alert and in no acute distress. He has hearing impairment. He is blind in right eye.   Conjunctiva is pale.  Sclerae nonicteric. Oropharyngeal mucosa is normal.  Dentition in satisfactory condition. No neck masses or thyromegaly noted.  He has left carotid endarterectomy scar. Cardiac exam with regular rhythm normal S1 and S2.  He has faint systolic murmur heard at aortic area as well as upper left sternal border. Auscultation of lungs reveal vesicular breath sounds bilaterally. Abdomen is full.  Bowel sounds are normal.  He has faint abdominal bruit heard in mid abdomen to the right of midline.  On palpation abdomen is soft and nontender with organomegaly or masses. He does not have peripheral edema clubbing or koilonychia.  Lab Results: Recent Labs    01/23/20 1319 01/23/20 1319 01/23/20 1941 01/24/20 0117 01/24/20 0802  WBC 18.4*  --   --   --  21.0*  HGB 9.5*   < > 9.3* 8.8* 9.3*  HCT 30.4*   < > 28.8* 27.2* 29.6*  PLT 429*  --   --   --  464*   < > = values in this interval not displayed.   BMET Recent Labs    01/23/20 1319 01/24/20 0802  NA 130* 128*  K 3.7 3.4*  CL 91* 91*  CO2 31 27  GLUCOSE 111* 106*  BUN 17  16  CREATININE 0.82 0.84  CALCIUM 7.9* 7.8*   LFT Recent Labs    01/24/20 0802  PROT 6.4*  ALBUMIN 1.7*  AST 35  ALT 32  ALKPHOS 154*  BILITOT 0.7   Folate level 4.0. B12 level 354.  Blood cultures at 24 hours are negative  Studies/Results: CT Chest W Contrast  Result Date: 01/23/2020 CLINICAL DATA:  85 year old male with generalized weakness. EXAM: CT CHEST WITH CONTRAST TECHNIQUE: Multidetector CT imaging of the chest was performed during intravenous contrast administration. CONTRAST:  79mL OMNIPAQUE IOHEXOL 300 MG/ML  SOLN  COMPARISON:  Chest radiograph dated 01/23/2020. Chest CT dated 12/29/2019 FINDINGS: Cardiovascular: There is no cardiomegaly or pericardial effusion. Coronary vascular calcification primarily involving the LAD and left circumflex artery. There is moderate atherosclerotic calcification of the thoracic aorta. No aneurysmal dilatation or dissection. Evaluation of the pulmonary arteries is limited due to suboptimal opacification. No definite pulmonary artery embolus identified. Mediastinum/Nodes: There is no hilar or mediastinal adenopathy. Right hilar lymph node measures 7 mm in short axis. The esophagus is grossly unremarkable. No mediastinal fluid collection. Lungs/Pleura: There is a small right pleural effusion, decreased in size since the prior CT. Right upper lobe streaky and nodular densities most consistent with pneumonia. Overall improvement of the right upper lobe densities since the prior CT. Clinical correlation and continued follow-up recommended. Additional nodular densities in the left apex and left lower lobe. There is no pneumothorax. The central airways are patent. There is mild bronchial wall thickening most consistent with bronchitis. Biapical scarring noted. Upper Abdomen: Small gallstones. Musculoskeletal: Degenerative changes of the spine. No acute osseous pathology. IMPRESSION: 1. Right upper lobe pneumonia, improved since the prior CT. Clinical correlation and continued follow-up recommended. 2. Small right pleural effusion, decreased in size since the prior CT. 3. Mild bronchial wall thickening most consistent with bronchitis. 4. Cholelithiasis. 5. Aortic Atherosclerosis (ICD10-I70.0). Electronically Signed   By: Elgie Collard M.D.   On: 01/23/2020 17:23   DG Chest Portable 1 View  Result Date: 01/23/2020 CLINICAL DATA:  Progressive weakness. Recent history of pneumonia. EXAM: PORTABLE CHEST 1 VIEW COMPARISON:  Chest x-ray dated 01/19/2020 and chest CT dated 12/29/2019 FINDINGS: The  heart size and pulmonary vascularity are normal. Aortic atherosclerosis. Chronic blunting of the right costophrenic angle laterally. Chronic slight parenchymal scarring in the right midzone. Left lung is clear. No acute bone abnormality. Moderate arthritic changes of both glenohumeral joints. IMPRESSION: 1. No acute abnormalities. 2.  Aortic Atherosclerosis (ICD10-I70.0). Electronically Signed   By: Francene Boyers M.D.   On: 01/23/2020 13:45    Assessment;  Patient is 85 year old Caucasian male who was recently hospitalized(3 weeks ago) for pneumonia when he was noted to be anemic and had heme positive stool.  Patient presented again yesterday with progressive weakness.  His weakness was felt to be due to anemia.  He was therefore hospitalized for further evaluation.  No history of melena or rectal bleeding.  Patient has anorexia nausea and 19 pound weight loss.  He has remote history of peptic ulcer disease.  He is on low-dose aspirin and recent illness could have triggered peptic ulcer disease.  Last colonoscopy was several years ago.   Iron studies do not confirm iron deficiency anemia but he does have folate deficiency which may be contributing to his anemia. Differential diagnosis includes peptic ulcer disease or neoplasm of upper or lower GI tract.  Folate deficiency appears to be nutritional.  Profound weakness cannot be explained on the basis of low hemoglobin  alone.  He does have mild hyponatremia.  Need to rule out Addison's disease.  Hypoalbuminemia with appear to be due to malnutrition and diminished oral intake.  Urine analysis from yesterday negative for proteinuria.  Leukocytosis.  Urine analysis was unremarkable and blood cultures are negative.  Recent hospitalization for pneumonia 3 weeks ago with he does not appear to have active infection.  Therefore no explanation for leukocytosis unless he has occult neoplasm.  Cholelithiasis appear to be asymptomatic.  He has nausea but no  other typical symptoms.  If no other cause found for his nausea may consider HIDA scan.  Recommendations;  Fasting cortisol level in a.m. INR with a.m. lab. Dulcolax 2 tablets tonight. Begin GoLYTELY tomorrow morning at 6 AM at a rate of 4 ounces by mouth every 5 minutes until finished. Patient will undergo diagnostic esophagogastroduodenoscopy and colonoscopy by Dr. Rourk tomorrow.   LOS: 0 days      01/24/2020, 6:01 PM    

## 2020-01-24 NOTE — Plan of Care (Signed)

## 2020-01-24 NOTE — Progress Notes (Signed)
Initial Nutrition Assessment  DOCUMENTATION CODES:   Not applicable  INTERVENTION:  Boost Breeze po TID, each supplement provides 250 kcal and 9 grams of protein  Will provide vanilla Ensure Enlive BID and chocolate CIB on breakfast tray with diet advancement.    NUTRITION DIAGNOSIS:   Inadequate oral intake related to decreased appetite as evidenced by per patient/family report, percent weight loss(poor home po over the past month).   GOAL:   Patient will meet greater than or equal to 90% of their needs    MONITOR:   PO intake, Labs, Weight trends, Supplement acceptance, Diet advancement  REASON FOR ASSESSMENT:   Consult Assessment of nutrition requirement/status  ASSESSMENT:  85 year old male with past medical history significant of right eye blindness, carotid artery occlusion, impaired hearing, HLD, HTN, recent admission 3/28-3/30 due to CAP and ischemic changes on EKG presents with feeling very weak, dyspneic with exertion, somnolent, poor appetite, and fatigue since returning home and admitted on 4/21 for observation of symptomatic anemia.  Per chart review, EDP spoke with patient's son-in-law via phone who reports a drastic change in patient from a couple of months ago. Reports that patient is normally active, walks a lot and is pretty sure pt does not have a history of anemia. Reports that pt appetite has been very poor and has not tolerated the oral iron. Patient noted to have heme+ stool during previous admission and has a GI f/u this week.   Patient awake, alert, reports feeling very hungry this afternoon. Wife at bedside reports patient has not eaten in the past 20 hours and is feeling nauseas d/t not eating. Patient NPO this morning, diet advanced to CL this afternoon. Patient reports that his home appetite is not as good as it should be, stated that he is going to start eating more. He recalls poor appetite over the past month s/p discharge from hospital for CAP.  Wife reports that patient will have something small when he gets up like a piece of fruit, around 11AM she will make him scrambled eggs and grits, and patient typically does not want lunch, recalls occasionally eating a banana and has bites of dinner. She recently bought him Ensure and he has been drinking one daily. RD educated on small frequent meals/snacks every 3-4 hours verses 3 large meals/day, recommended increasing supplement 2/day with poor home po.    Current wt 214.5 lbs UBW 225 lbs per wife report Limited recent weight history for review, per history on 01/01/20 pt weighed 224.4 lbs, reviewed Care Everywhere, no weights available for 2020. Appears weights have remained a stable 225-229 lbs over the past 5 years. This indicates a 9.9 lb (4.4%) wt loss in the month which is insignificant for time frame, although concerning given his advanced age.  Medications reviewed and include: Colace IVF: NaCl Labs: Na 128 (L), K 3.4 (L), Corrected Ca 9.64 (WNL), WBC 21 (H)  NUTRITION - FOCUSED PHYSICAL EXAM: Moderate fat depletion to orbital region, Mild fat depletion to buccal region; Mild muscle depletion to temple region and dorsal hand regions.  Diet Order:   Diet Order            Diet clear liquid Room service appropriate? Yes; Fluid consistency: Thin  Diet effective now              EDUCATION NEEDS:   Education needs have been addressed  Skin:  Skin Assessment: Reviewed RN Assessment  Last BM:  4/17  Height:   Ht Readings  from Last 1 Encounters:  01/23/20 6' (1.829 m)    Weight:   Wt Readings from Last 1 Encounters:  01/23/20 97.5 kg    Ideal Body Weight:     BMI:  Body mass index is 29.16 kg/m.  Estimated Nutritional Needs:   Kcal:  7639-4320  Protein:  105-115  Fluid:  >/= 2 L/day   Lars Masson, RD, LDN Clinical Nutrition After Hours/Weekend Pager # in Amion

## 2020-01-24 NOTE — Progress Notes (Addendum)
Called to room due to IV leaking. IV dressing noted to be wet, loose and IV catheter noted to be hanging half way out. Luer lock connection found to be loose. Connections tightened, IV catheter advanced to hub, leaking resolved, IV flushes easily with noted blood return. New dry, sterile tegaderm applied to site and secured with tape.

## 2020-01-24 NOTE — Progress Notes (Signed)
Patient's wife reported he is having some nausea, since he has not been able to eat anything. Medications held for now. Wife and patient would like to speak with provider, as they have concerns about him not being able to eat since yesterday. Explained to them that patient is awaiting gastroenterology consult for plan and explained clear liquid diet. Patient says he cannot take it and wants to go home. MD notified.

## 2020-01-25 LAB — COMPREHENSIVE METABOLIC PANEL
ALT: 37 U/L (ref 0–44)
AST: 49 U/L — ABNORMAL HIGH (ref 15–41)
Albumin: 1.7 g/dL — ABNORMAL LOW (ref 3.5–5.0)
Alkaline Phosphatase: 160 U/L — ABNORMAL HIGH (ref 38–126)
Anion gap: 9 (ref 5–15)
BUN: 16 mg/dL (ref 8–23)
CO2: 26 mmol/L (ref 22–32)
Calcium: 7.8 mg/dL — ABNORMAL LOW (ref 8.9–10.3)
Chloride: 94 mmol/L — ABNORMAL LOW (ref 98–111)
Creatinine, Ser: 0.76 mg/dL (ref 0.61–1.24)
GFR calc Af Amer: >60 mL/min (ref >60)
GFR calc non Af Amer: >60 mL/min (ref >60)
Glucose, Bld: 110 mg/dL — ABNORMAL HIGH (ref 70–99)
Potassium: 4.3 mmol/L (ref 3.5–5.1)
Sodium: 129 mmol/L — ABNORMAL LOW (ref 135–145)
Total Bilirubin: 0.8 mg/dL (ref 0.3–1.2)
Total Protein: 6 g/dL — ABNORMAL LOW (ref 6.5–8.1)

## 2020-01-25 LAB — CBC
HCT: 28.8 % — ABNORMAL LOW (ref 39.0–52.0)
Hemoglobin: 8.9 g/dL — ABNORMAL LOW (ref 13.0–17.0)
MCH: 28 pg (ref 26.0–34.0)
MCHC: 30.9 g/dL (ref 30.0–36.0)
MCV: 90.6 fL (ref 80.0–100.0)
Platelets: 340 10*3/uL (ref 150–400)
RBC: 3.18 MIL/uL — ABNORMAL LOW (ref 4.22–5.81)
RDW: 13.9 % (ref 11.5–15.5)
WBC: 19.9 10*3/uL — ABNORMAL HIGH (ref 4.0–10.5)
nRBC: 0 % (ref 0.0–0.2)

## 2020-01-25 LAB — PROTIME-INR
INR: 1.2 (ref 0.8–1.2)
Prothrombin Time: 15 seconds (ref 11.4–15.2)

## 2020-01-25 LAB — MAGNESIUM: Magnesium: 1.9 mg/dL (ref 1.7–2.4)

## 2020-01-25 LAB — CORTISOL-AM, BLOOD: Cortisol - AM: 23.3 ug/dL — ABNORMAL HIGH (ref 6.7–22.6)

## 2020-01-25 MED ORDER — PEG 3350-KCL-NABCB-NACL-NASULF 236 G PO SOLR
2000.0000 mL | Freq: Once | ORAL | Status: AC
  Administered 2020-01-25: 18:00:00 2000 mL via ORAL
  Filled 2020-01-25: qty 4000

## 2020-01-25 MED ORDER — BISACODYL 5 MG PO TBEC
10.0000 mg | DELAYED_RELEASE_TABLET | Freq: Once | ORAL | Status: AC
  Administered 2020-01-25: 09:00:00 10 mg via ORAL
  Filled 2020-01-25: qty 2

## 2020-01-25 MED ORDER — SODIUM CHLORIDE 0.9 % IV SOLN: INTRAVENOUS | Status: AC

## 2020-01-25 MED ORDER — POLYETHYLENE GLYCOL 3350 17 G PO PACK
17.0000 g | PACK | ORAL | Status: AC
  Administered 2020-01-25 (×5): 17 g via ORAL
  Filled 2020-01-25 (×4): qty 1

## 2020-01-25 MED ORDER — FLEET ENEMA 7-19 GM/118ML RE ENEM
1.0000 | ENEMA | Freq: Once | RECTAL | Status: AC
  Administered 2020-01-26: 06:00:00 1 via RECTAL

## 2020-01-25 MED ORDER — METOPROLOL TARTRATE 25 MG PO TABS
12.5000 mg | ORAL_TABLET | Freq: Two times a day (BID) | ORAL | Status: DC
  Administered 2020-01-25: 22:00:00 12.5 mg via ORAL
  Filled 2020-01-25 (×2): qty 1

## 2020-01-25 NOTE — Progress Notes (Signed)
Patient started on golytley, instructed to drink slowly (a cup every 15 minutes) per [facility], and to drink only half of jug. Patient and wife expressed understanding. Also updated about patient being on the schedule tomorrow for 0825am.

## 2020-01-25 NOTE — Progress Notes (Signed)
PROGRESS NOTE    Jeffrey Ho  YFV:[id number] DOB: 1935/08/10 DOA: 01/23/2020 PCP: [name], MD   Brief Narrative:  Per HPI: Jeffrey Ho a 85 y.o.malewith medical history significant ofright eye blindness, carotid artery occlusion, impaired hearing, hyperlipidemia, hypertension who was admitted from 12/31/2019 until 01/02/2020 due to community-acquired pneumonia and ischemic changes on EKG.the patient states that since he has been discharged he has been very weak, dyspneic with exertion, somnolent and fatigue. He mentions that according to his wife he has not been eating like usual. He denies fever, chills, rhinorrhea, sore throat, but has been having a dry cough. He denies chest pain, palpitations, dizziness, diaphoresis, PND, orthopnea or pitting edema of the lower extremities. He has some lower abdomen pain yesterday, but no longer there on palpation. He denies nausea, emesis, diarrhea, constipation, melena or hematochezia. No flank pain, dysuria, frequency or hematuria. Denies polyuria, polydipsia, polyphagia or blurred vision.  4/21: Patient was admitted with persistent weakness with questionable symptomatic iron deficiency anemia as well as some noted hyponatremia.  He has not had any overt bleeding and hemoglobin levels appear to remain stable.  GI evaluation pending.  Continue on IV normal saline with further evaluation of hyponatremia ordered and pending.  4/22: Unfortunately, patient vomited most of his GoLYTELY prep this morning and he had further enemas and MiraLAX, but still is not ready for colonoscopy.  He will have further prep this evening with colonoscopy anticipated for a.m.  He continues to have some hyponatremia for which IV normal saline has been initiated.  Cortisol levels are within normal limits.  Continue to decrease blood pressure medication dosing with metoprolol now 12.5 mg twice daily.  PT evaluation still pending.  Assessment & Plan:     Principal Problem:   Symptomatic anemia Active Problems:   HTN (hypertension)   Carotid artery disease without cerebral infarction (HCC)   Hyperlipidemia   Heme positive stool   Prolonged QT interval   Hyponatremia   Hypoalbuminemia due to protein-calorie malnutrition (HCC)   Leukocytosis   Weakness   Generalized weakness associated with poor appetite -Question if this is related to symptomatic iron deficiency anemia versus hyponatremia versus other cause -We will cut metoprolol dose to 12.5 mg twice daily for now as his lower blood pressure readings may be partly to blame -Appreciate GI evaluation to consider colonoscopy -Recent 2D echocardiogram with no significant findings -TSH noted to be 1.4 -Cortisol 23.3 -B12 of 354 -PT evaluation ordered and still pending  Iron deficiency anemia-stable -Positive heme in stool previously and was supposed to see GI on 4/23 outpatient setting -Currently appears stable -Follow CBC with no overt bleeding noted -Iron panel with adequate supplementation noted, hold oral supplementation for now  Hyponatremia-persistent -Start IV normal saline for time-limited -Evaluation ordered with serum and urine osmolarity -May be related to home hydrochlorothiazide which has been discontinued -TSH WNL -Recheck in am -Cortisol levels within normal limits  Hypertension-stable -Continue metoprolol 12.5 mg p.o. twice daily and hold HCTZ  Carotid artery disease -Hold aspirin and continue statin  Dyslipidemia -Continue atorvastatin 20 mg p.o. daily  Hypoalbuminemia with moderate protein calorie malnutrition -Appreciate dietitian evaluation  Persistent leukocytosis -No signs of infection currently noted -Follow repeat CBC -Check peripheral smear which is pending  DVT prophylaxis: SCDs Code Status: Full code Family Communication: Wife at bedside Disposition Plan: Appreciate GI evaluation along with PT evaluation.  Continue on IV normal  saline for hyponatremia with studies pending.  Repeat a.m. labs.  May require  placement prior to discharge.   Consultants:   GI  Procedures:   None  Antimicrobials:   None  Subjective: Patient seen and evaluated today with no new acute complaints or concerns. No acute concerns or events noted overnight.  He is frustrated about not being able to eat very much and is frustrated with not getting his colonoscopy today and poor prep.  Objective: Vitals:   01/24/20 2144 01/25/20 0433 01/25/20 0900 01/25/20 1132  BP:  (!) 122/55  113/60  Pulse:  82  81  Resp:  20  18  Temp:  98.7 F (37.1 C)  98.6 F (37 C)  TempSrc:  Oral  Oral  SpO2: 92% 100% 96% 100%  Weight:      Height:        Intake/Output Summary (Last 24 hours) at 01/25/2020 1643 Last data filed at 01/24/2020 1700 Gross per 24 hour  Intake 240 ml  Output --  Net 240 ml   Filed Weights   01/23/20 1126  Weight: 97.5 kg    Examination:  General exam: Appears calm and comfortable, hard of hearing and has hearing aids Respiratory system: Clear to auscultation. Respiratory effort normal. Cardiovascular system: S1 & S2 heard, RRR. No JVD, murmurs, rubs, gallops or clicks. No pedal edema. Gastrointestinal system: Abdomen is nondistended, soft and nontender. No organomegaly or masses felt. Normal bowel sounds heard. Central nervous system: Alert and oriented. No focal neurological deficits. Extremities: Symmetric 5 x 5 power. Skin: No rashes, lesions or ulcers Psychiatry: Judgement and insight appear normal. Mood & affect appropriate.     Data Reviewed: I have personally reviewed following labs and imaging studies  CBC: Recent Labs  Lab 01/23/20 1319 01/23/20 1941 01/24/20 0117 01/24/20 0802 01/25/20 0516  WBC 18.4*  --   --  21.0* 19.9*  NEUTROABS 15.3*  --   --   --   --   HGB 9.5* 9.3* 8.8* 9.3* 8.9*  HCT 30.4* 28.8* 27.2* 29.6* 28.8*  MCV 90.5  --   --  90.2 90.6  PLT 429*  --   --  464* 578    Basic Metabolic Panel: Recent Labs  Lab 01/23/20 1319 01/24/20 0802 01/25/20 0516  NA 130* 128* 129*  K 3.7 3.4* 4.3  CL 91* 91* 94*  CO2 31 27 26   GLUCOSE 111* 106* 110*  BUN 17 16 16   CREATININE 0.82 0.84 0.76  CALCIUM 7.9* 7.8* 7.8*  MG 1.9  --  1.9   GFR: Estimated Creatinine Clearance: 86.2 mL/min (by C-G formula based on SCr of 0.76 mg/dL). Liver Function Tests: Recent Labs  Lab 01/23/20 1319 01/24/20 0802 01/25/20 0516  AST 30 35 49*  ALT 31 32 37  ALKPHOS 150* 154* 160*  BILITOT 0.5 0.7 0.8  PROT 6.5 6.4* 6.0*  ALBUMIN 1.9* 1.7* 1.7*   No results for input(s): LIPASE, AMYLASE in the last 168 hours. No results for input(s): AMMONIA in the last 168 hours. Coagulation Profile: Recent Labs  Lab 01/25/20 0516  INR 1.2   Cardiac Enzymes: No results for input(s): CKTOTAL, CKMB, CKMBINDEX, TROPONINI in the last 168 hours. BNP (last 3 results) No results for input(s): PROBNP in the last 8760 hours. HbA1C: No results for input(s): HGBA1C in the last 72 hours. CBG: No results for input(s): GLUCAP in the last 168 hours. Lipid Profile: No results for input(s): CHOL, HDL, LDLCALC, TRIG, CHOLHDL, LDLDIRECT in the last 72 hours. Thyroid Function Tests: Recent Labs    01/23/20 1945  TSH 1.414   Anemia Panel: Recent Labs    01/24/20 0802  VITAMINB12 354  FOLATE 4.0*  FERRITIN 1,910*  TIBC 137*  IRON 114  RETICCTPCT 2.0   Sepsis Labs: Recent Labs  Lab 01/23/20 1319 01/23/20 1538  LATICACIDVEN 1.4 1.2    Recent Results (from the past 240 hour(s))  Blood culture (routine x 2)     Status: None (Preliminary result)   Collection Time: 01/23/20  5:30 PM   Specimen: Left Antecubital; Blood  Result Value Ref Range Status   Specimen Description LEFT ANTECUBITAL  Final   Special Requests   Final    BOTTLES DRAWN AEROBIC AND ANAEROBIC Blood Culture adequate volume   Culture   Final    NO GROWTH 2 DAYS Performed at [facility], [address]  [place], [place] [address]    Report Status PENDING  Incomplete  Blood culture (routine x 2)     Status: None (Preliminary result)   Collection Time: 01/23/20  5:30 PM   Specimen: BLOOD RIGHT HAND  Result Value Ref Range Status   Specimen Description BLOOD RIGHT HAND  Final   Special Requests   Final    BOTTLES DRAWN AEROBIC AND ANAEROBIC Blood Culture adequate volume   Culture   Final    NO GROWTH 2 DAYS Performed at [facility], [address] [place], [place] [address]    Report Status PENDING  Incomplete  SARS CORONAVIRUS 2 (TAT 6-24 HRS) Nasopharyngeal Nasopharyngeal Swab     Status: None   Collection Time: 01/23/20  7:23 PM   Specimen: Nasopharyngeal Swab  Result Value Ref Range Status   SARS Coronavirus 2 NEGATIVE NEGATIVE Final    Comment: (NOTE) SARS-CoV-2 target nucleic acids are NOT DETECTED. The SARS-CoV-2 RNA is generally detectable in upper and lower respiratory specimens during the acute phase of infection. Negative results do not preclude SARS-CoV-2 infection, do not rule out co-infections with other pathogens, and should not be used as the sole basis for treatment or other patient management decisions. Negative results must be combined with clinical observations, patient history, and epidemiological information. The expected result is Negative. Fact Sheet for Patients: HairSlick.no Fact Sheet for Healthcare Providers: quierodirigir.com This test is not yet approved or cleared by the [place] FDA and  has been authorized for detection and/or diagnosis of SARS-CoV-2 by FDA under an Emergency Use Authorization (EUA). This EUA will remain  in effect (meaning this test can be used) for the duration of the COVID-19 declaration under Section 56 4(b)(1) of the Act, 21 U.S.C. section 360bbb-3(b)(1), unless the authorization is terminated or revoked sooner. Performed at [facility], [address]  [place], [place] [address]          Radiology Studies: CT Chest W Contrast  Result Date: 01/23/2020 CLINICAL DATA:  85 year old male with generalized weakness. EXAM: CT CHEST WITH CONTRAST TECHNIQUE: Multidetector CT imaging of the chest was performed during intravenous contrast administration. CONTRAST:  9mL OMNIPAQUE IOHEXOL 300 MG/ML  SOLN COMPARISON:  Chest radiograph dated 01/23/2020. Chest CT dated 12/29/2019 FINDINGS: Cardiovascular: There is no cardiomegaly or pericardial effusion. Coronary vascular calcification primarily involving the LAD and left circumflex artery. There is moderate atherosclerotic calcification of the thoracic aorta. No aneurysmal dilatation or dissection. Evaluation of the pulmonary arteries is limited due to suboptimal opacification. No definite pulmonary artery embolus identified. Mediastinum/Nodes: There is no hilar or mediastinal adenopathy. Right hilar lymph node measures 7 mm in short axis. The esophagus is grossly  unremarkable. No mediastinal fluid collection. Lungs/Pleura: There is a small right pleural effusion, decreased in size since the prior CT. Right upper lobe streaky and nodular densities most consistent with pneumonia. Overall improvement of the right upper lobe densities since the prior CT. Clinical correlation and continued follow-up recommended. Additional nodular densities in the left apex and left lower lobe. There is no pneumothorax. The central airways are patent. There is mild bronchial wall thickening most consistent with bronchitis. Biapical scarring noted. Upper Abdomen: Small gallstones. Musculoskeletal: Degenerative changes of the spine. No acute osseous pathology. IMPRESSION: 1. Right upper lobe pneumonia, improved since the prior CT. Clinical correlation and continued follow-up recommended. 2. Small right pleural effusion, decreased in size since the prior CT. 3. Mild bronchial wall thickening most consistent with bronchitis. 4. Cholelithiasis. 5.  Aortic Atherosclerosis (ICD10-I70.0). Electronically Signed   By: Elgie Collard M.D.   On: 01/23/2020 17:23        Scheduled Meds: . vitamin C  250 mg Oral Daily  . atorvastatin  20 mg Oral Daily  . docusate sodium  100 mg Oral BID  . feeding supplement  1 Container Oral TID BM  . metoprolol tartrate  12.5 mg Oral BID  . polyethylene glycol  2,000 mL Oral Once  . [START ON 01/26/2020] sodium phosphate  1 enema Rectal Once   Continuous Infusions: . sodium chloride       LOS: 1 day    Time spent: 30 minutes    Kofi Murrell Hoover Brunette, DO [facility]  If 7PM-7AM, please contact night-coverage [email or web address] 01/25/2020, 4:43 PM

## 2020-01-25 NOTE — Progress Notes (Signed)
Patient on schedule for EGD/colonoscopy today for anemia, heme positive stool, nausea/weight loss. Patient completed entire Golytely prep but per nursing staff he was vomiting up some prep at 7:30 this morning during report. Per nursing, he drank the prep very fast. He is currently passing loose brown stool. I have ordered dulcolax 10mg  po for now. Miralax 17 grams every 30 minutes X 5. Will follow with two tap water enemas. Plan has been communicated to patient's nurse (Destiny) and nursing student.   . Leanna Battles [facility] [phone] 4/22/20218:47 AM

## 2020-01-25 NOTE — Progress Notes (Signed)
Spoke to [employer], Charity fundraiser, patient is still having brown watery stool, lighter in color but not clear. Notified her that case will be postponed until tomorrow. Will provide additional prep. Clear liquids now. NPO after midnight.   Leanna Battles. [facility] [phone] 4/22/20212:20 PM

## 2020-01-25 NOTE — Progress Notes (Signed)
PT Cancellation Note  Patient Details Name: Jeffrey Ho MRN: [id number] DOB: [birth year]   Cancelled Treatment:    Reason Eval/Treat Not Completed: Patient declined, no reason specified. Upon arrival, pt/pt's spouse reports pt was unable to have procedure today and both appear frustrated. Pt declined PT eval due to not eating in 3 days and being so disappointed with hospital stay. Therapist offered any assistance and pt's spouse reports the pt needs food, but they are waiting to hear back what the pt is allowed to have. Educated pt and spouse therapy will evaluate pt  tomorrow to assess what needs the pt may have upon discharge and both verbalized understanding.    Domenick Bookbinder PT, DPT 01/25/20, 3:33 PM [phone]

## 2020-01-25 NOTE — Progress Notes (Signed)
PT Cancellation Note  Patient Details Name: Jeffrey Ho MRN: [id number] DOB: 06-Oct-1934   Cancelled Treatment:    Reason Eval/Treat Not Completed: Patient at procedure or test/unavailable. Pt/pt's spouse reports pt has started taking medicine in preparation for scope requiring pt to need the restroom frequently and decline PT eval at this time. Will continue to follow.   Domenick Bookbinder PT, DPT 01/25/20, 11:48 AM [phone]

## 2020-01-26 ENCOUNTER — Inpatient Hospital Stay (HOSPITAL_COMMUNITY): Payer: Medicare Other | Admitting: Anesthesiology

## 2020-01-26 ENCOUNTER — Ambulatory Visit: Payer: Medicare Other | Admitting: Gastroenterology

## 2020-01-26 ENCOUNTER — Encounter (HOSPITAL_COMMUNITY): Payer: Self-pay | Admitting: Internal Medicine

## 2020-01-26 ENCOUNTER — Encounter (HOSPITAL_COMMUNITY)
Admission: EM | Disposition: A | Discharge status: Home Health | Payer: Self-pay | Source: Home / Self Care | Attending: Internal Medicine

## 2020-01-26 HISTORY — PX: ESOPHAGOGASTRODUODENOSCOPY (EGD) WITH PROPOFOL: SHX5813

## 2020-01-26 HISTORY — PX: BIOPSY: SHX5522

## 2020-01-26 HISTORY — PX: COLONOSCOPY WITH PROPOFOL: SHX5780

## 2020-01-26 HISTORY — PX: POLYPECTOMY: SHX5525

## 2020-01-26 LAB — SODIUM, URINE, RANDOM: Sodium, Ur: 31 mmol/L

## 2020-01-26 LAB — OSMOLALITY, URINE: Osmolality, Ur: 568 mOsm/kg (ref 300–900)

## 2020-01-26 LAB — COMPREHENSIVE METABOLIC PANEL
ALT: 28 U/L (ref 0–44)
AST: 31 U/L (ref 15–41)
Albumin: 1.6 g/dL — ABNORMAL LOW (ref 3.5–5.0)
Alkaline Phosphatase: 126 U/L (ref 38–126)
Anion gap: 8 (ref 5–15)
BUN: 14 mg/dL (ref 8–23)
CO2: 25 mmol/L (ref 22–32)
Calcium: 7.5 mg/dL — ABNORMAL LOW (ref 8.9–10.3)
Chloride: 96 mmol/L — ABNORMAL LOW (ref 98–111)
Creatinine, Ser: 0.72 mg/dL (ref 0.61–1.24)
GFR calc Af Amer: >60 mL/min (ref >60)
GFR calc non Af Amer: >60 mL/min (ref >60)
Glucose, Bld: 113 mg/dL — ABNORMAL HIGH (ref 70–99)
Potassium: 3.6 mmol/L (ref 3.5–5.1)
Sodium: 129 mmol/L — ABNORMAL LOW (ref 135–145)
Total Bilirubin: 0.4 mg/dL (ref 0.3–1.2)
Total Protein: 5.5 g/dL — ABNORMAL LOW (ref 6.5–8.1)

## 2020-01-26 LAB — PATHOLOGIST SMEAR REVIEW

## 2020-01-26 SURGERY — ESOPHAGOGASTRODUODENOSCOPY (EGD) WITH PROPOFOL
Anesthesia: General

## 2020-01-26 MED ORDER — SODIUM CHLORIDE 0.9 % IV SOLN: INTRAVENOUS | Status: DC

## 2020-01-26 MED ORDER — PROPOFOL 10 MG/ML IV BOLUS
INTRAVENOUS | Status: AC
  Filled 2020-01-26: qty 20

## 2020-01-26 MED ORDER — PROPOFOL 10 MG/ML IV BOLUS
INTRAVENOUS | Status: DC | PRN
  Administered 2020-01-26 (×2): 20 mg via INTRAVENOUS

## 2020-01-26 MED ORDER — EPHEDRINE SULFATE 50 MG/ML IJ SOLN
INTRAMUSCULAR | Status: DC | PRN
  Administered 2020-01-26: 10 mg via INTRAVENOUS

## 2020-01-26 MED ORDER — PROPOFOL 500 MG/50ML IV EMUL
INTRAVENOUS | Status: DC | PRN
  Administered 2020-01-26: 150 ug/kg/min via INTRAVENOUS

## 2020-01-26 MED ORDER — GLYCOPYRROLATE 0.2 MG/ML IJ SOLN
INTRAMUSCULAR | Status: DC | PRN
Start: 2020-01-26 — End: 2020-01-26
  Administered 2020-01-26: .2 mg via INTRAVENOUS

## 2020-01-26 MED ORDER — METOPROLOL TARTRATE 25 MG PO TABS: 12.5000 mg | ORAL_TABLET | Freq: Two times a day (BID) | ORAL | 2 refills | Status: DC

## 2020-01-26 MED ORDER — KETAMINE HCL 50 MG/5ML IJ SOSY
PREFILLED_SYRINGE | INTRAMUSCULAR | Status: AC
  Filled 2020-01-26: qty 5

## 2020-01-26 MED ORDER — FENTANYL CITRATE (PF) 100 MCG/2ML IJ SOLN: 25.0000 ug | INTRAMUSCULAR | Status: DC | PRN

## 2020-01-26 MED ORDER — LACTATED RINGERS IV SOLN: INTRAVENOUS | Status: DC

## 2020-01-26 MED ORDER — PHENYLEPHRINE HCL (PRESSORS) 10 MG/ML IV SOLN
INTRAVENOUS | Status: DC | PRN
  Administered 2020-01-26 (×3): 80 ug via INTRAVENOUS
  Administered 2020-01-26 (×2): 60 ug via INTRAVENOUS
  Administered 2020-01-26 (×2): 80 ug via INTRAVENOUS

## 2020-01-26 MED ORDER — PHENYLEPHRINE 40 MCG/ML (10ML) SYRINGE FOR IV PUSH (FOR BLOOD PRESSURE SUPPORT)
PREFILLED_SYRINGE | INTRAVENOUS | Status: AC
  Filled 2020-01-26: qty 10

## 2020-01-26 NOTE — Anesthesia Postprocedure Evaluation (Signed)
Anesthesia Post Note  Patient: Jeffrey Ho  Procedure(s) Performed: ESOPHAGOGASTRODUODENOSCOPY (EGD) WITH PROPOFOL (N/A ) COLONOSCOPY WITH PROPOFOL (N/A ) BIOPSY POLYPECTOMY  Anesthesia Type: General     Last Vitals:  Vitals:   01/26/20 1015 01/26/20 1125  BP: (!) 113/57 119/66  Pulse: 93 97  Resp: (!) 29 18  Temp:    SpO2: 98% 100%    Last Pain:  Vitals:   01/26/20 1015  TempSrc:   PainSc: 0-No pain                 Windell Norfolk

## 2020-01-26 NOTE — Anesthesia Preprocedure Evaluation (Signed)
Anesthesia Evaluation  Patient identified by MRN, date of birth, ID band Patient awake    Reviewed: Allergy & Precautions, H&P , NPO status , Patient's Chart, lab work & pertinent test results, reviewed documented beta blocker date and time   Airway Mallampati: II  TM Distance: >3 FB Neck ROM: full    Dental no notable dental hx. (+) Teeth Intact   Pulmonary pneumonia, resolved, former smoker,    Pulmonary exam normal breath sounds clear to auscultation       Cardiovascular Exercise Tolerance: Good hypertension, negative cardio ROS   Rhythm:regular Rate:Normal     Neuro/Psych negative neurological ROS  negative psych ROS   GI/Hepatic negative GI ROS, Neg liver ROS,   Endo/Other  negative endocrine ROS  Renal/GU negative Renal ROS  negative genitourinary   Musculoskeletal   Abdominal   Peds  Hematology  (+) Blood dyscrasia, anemia ,   Anesthesia Other Findings   Reproductive/Obstetrics negative OB ROS                             Anesthesia Physical Anesthesia Plan  ASA: III  Anesthesia Plan: General   Post-op Pain Management:    Induction:   PONV Risk Score and Plan: 2 and Propofol infusion  Airway Management Planned:   Additional Equipment:   Intra-op Plan:   Post-operative Plan:   Informed Consent: I have reviewed the patients History and Physical, chart, labs and discussed the procedure including the risks, benefits and alternatives for the proposed anesthesia with the patient or authorized representative who has indicated his/her understanding and acceptance.     Dental Advisory Given  Plan Discussed with: CRNA  Anesthesia Plan Comments:         Anesthesia Quick Evaluation

## 2020-01-26 NOTE — Transfer of Care (Signed)
Immediate Anesthesia Transfer of Care Note  Patient: Jeffrey Ho  Procedure(s) Performed: ESOPHAGOGASTRODUODENOSCOPY (EGD) WITH PROPOFOL (N/A ) COLONOSCOPY WITH PROPOFOL (N/A ) BIOPSY POLYPECTOMY  Patient Location: PACU  Anesthesia Type:General  Level of Consciousness: awake  Airway & Oxygen Therapy: Patient Spontanous Breathing  Post-op Assessment: Report given to RN  Post vital signs: Reviewed  Last Vitals:  Vitals Value Taken Time  BP    Temp    Pulse    Resp    SpO2      Last Pain:  Vitals:   01/26/20 0758  TempSrc: Oral  PainSc: 0-No pain      Patients Stated Pain Goal: 5 (01/26/20 0758)  Complications: No apparent anesthesia complications

## 2020-01-26 NOTE — Interval H&P Note (Signed)
History and Physical Interval Note:  01/26/2020 8:37 AM  Jeffrey Ho  has presented today for surgery, with the diagnosis of Nausea anorexia anemia and heme positive stool.  The various methods of treatment have been discussed with the patient and family. After consideration of risks, benefits and other options for treatment, the patient has consented to  Procedure(s): ESOPHAGOGASTRODUODENOSCOPY (EGD) WITH PROPOFOL (N/A) COLONOSCOPY WITH PROPOFOL (N/A) as a surgical intervention.  The patient's history has been reviewed, patient examined, no change in status, stable for surgery.  I have reviewed the patient's chart and labs.  Questions were answered to the patient's satisfaction.     [name] Patient seen in short stay.  Hemoglobin 8.9 yesterday; no repeat so far today.  Leukocytosis persists.  Peripheral smear review pending.  Patient denies dysphagia.  Agree with need for both EGD and colonoscopy at this time. The risks, benefits, limitations, imponderables and alternatives regarding both EGD and colonoscopy have been reviewed with the patient. Questions have been answered. All parties agreeable.    Further recommendations to follow.

## 2020-01-26 NOTE — TOC Transition Note (Signed)
Transition of Care [facility]) - CM/SW Discharge Note   Patient Details  Name: RODERIC LAMMERT MRN: [id number] Date of Birth: [birth year]  Transition of Care [facility]) CM/SW Contact:  Leitha Bleak, RN Phone Number: 01/26/2020, 2:00 PM   Clinical Narrative:   Patient admitted for symptomatic anemia. Patient discharging home. PT is recommending HHPT. Alroy Bailiff with [facility] accepted the referral.  Patient also need 3N1, ordered and Therisa Doyne with [facility] accepted the referral.     Final next level of care: Home w Home Health Services Barriers to Discharge: Barriers Resolved   Patient Goals and CMS Choice Patient states their goals for this hospitalization and ongoing recovery are:: to go home. CMS Medicare.gov Compare Post Acute Care list provided to:: Patient Choice offered to / list presented to : Patient        Discharge Plan and Services        HH Arranged: PT [facility] Agency: [facility] Date [facility] Agency Contacted: 01/26/20 Time HH Agency Contacted: [phone] Representative spoke with at [facility] Agency: Alroy Bailiff

## 2020-01-26 NOTE — Op Note (Signed)
[facility] Patient Name: Jeffrey Ho Procedure Date: 01/26/2020 8:17 AM MRN: [id number] Date of Birth: 02-23-1935 Attending MD: Gennette Pac , MD CSN: [id number] Age: 85 Admit Type: Inpatient Procedure:                Upper GI endoscopy Indications:              Heme positive stool; anemia Providers:                Gennette Pac, MD, Loma Messing B. Patsy Lager, RN,                            Volanda Napoleon., Technician Referring MD:              Medicines:                Propofol per Anesthesia Complications:            No immediate complications. Estimated Blood Loss:     Estimated blood loss was minimal. Procedure:                Pre-Anesthesia Assessment:                           - Prior to the procedure, a History and Physical                            was performed, and patient medications and                            allergies were reviewed. The patient's tolerance of                            previous anesthesia was also reviewed. The risks                            and benefits of the procedure and the sedation                            options and risks were discussed with the patient.                            All questions were answered, and informed consent                            was obtained. Prior Anticoagulants: The patient has                            taken no previous anticoagulant or antiplatelet                            agents. ASA Grade Assessment: III - A patient with                            severe systemic disease. After reviewing the risks  and benefits, the patient was deemed in                            satisfactory condition to undergo the procedure.                           After obtaining informed consent, the endoscope was                            passed under direct vision. Throughout the                            procedure, the patient's blood pressure, pulse, and       oxygen saturations were monitored continuously. The                            GIF-H190 ([id number]) scope was introduced through the                            mouth, and advanced to the third part of duodenum.                            The upper GI endoscopy was accomplished without                            difficulty. The patient tolerated the procedure                            well. Scope In: 8:51:50 AM Scope Out: 9:00:57 AM Total Procedure Duration: 0 hours 9 minutes 7 seconds  Findings:      Circumferential esophageal erosions within 5 mm of the GE junction. No       Barrett's epithelium seen. Remainder of esophagus appeared normal. Small       hiatal hernia. Some mild deformity of the antrum with scar and patchy       erythema no ulcer or infiltrating process seen. Biopsies of the proximal       mid and distal stomach taken to assess for H. pylori      Examination of bulb, second third portion revealed no abnormalities Impression:               -Mild erosive reflux esophagitis. Small hiatal                            hernia. Scar and mild deformity of the antrum                            suggestive of prior peptic ulcer disease. Status                            post biopsy. I did not find an explanation for                            anemia and/or Hemoccult positive stool on on EGD  today unless patient has H pylori infection.                           Follow-up on histology. See colonoscopy report. Moderate Sedation:      Moderate (conscious) sedation was personally administered by an       anesthesia professional. The following parameters were monitored: oxygen       saturation, heart rate, blood pressure, respiratory rate, EKG, adequacy       of pulmonary ventilation, and response to care. Recommendation:           - Return patient to hospital ward for ongoing care.                           - Cardiac diet. Daily PPI.                            - Continue present medications.                           - Await pathology results. See colonoscopy report. Procedure Code(s):        --- Professional ---                           [phone], Esophagogastroduodenoscopy, flexible,                            transoral; diagnostic, including collection of                            specimen(s) by brushing or washing, when performed                            (separate procedure) Diagnosis Code(s):        --- Professional ---                           K20.90, Esophagitis, unspecified without bleeding                           R19.5, Other fecal abnormalities CPT copyright 2019 [employer]. All rights reserved. The codes documented in this report are preliminary and upon coder review may  be revised to meet current compliance requirements. Gerrit Friends. Macee Venables, MD Gennette Pac, MD 01/26/2020 9:49:02 AM This report has been signed electronically. Number of Addenda: 0

## 2020-01-26 NOTE — Discharge Summary (Addendum)
Physician Discharge Summary  Jeffrey Ho ZOX:[id number] DOB: 1935-08-23 DOA: 01/23/2020  PCP: [name], MD  Admit date: 01/23/2020  Discharge date: 01/26/2020  Admitted From:Home  Disposition:  Home  Recommendations for Outpatient Follow-up:  1. Follow up with PCP in 1-2 weeks and recheck blood pressure and BMP in office 2. Follow-up with Dr. Karilyn Cota with GI in 2-4 weeks to review pathology reports 3. Discontinue hydrochlorothiazide and remain on metoprolol 12.5 mg twice a day as ordered and follow-up heart rate and blood pressure readings with PCP  Home Health:Yes with PT  Equipment/Devices: None  Discharge Condition: Stable  CODE STATUS: Full  Diet recommendation: Heart Healthy  Brief/Interim Summary: Per HPI: Jeffrey Ho a 85 y.o.malewith medical history significant ofright eye blindness, carotid artery occlusion, impaired hearing, hyperlipidemia, hypertension who was admitted from 12/31/2019 until 01/02/2020 due to community-acquired pneumonia and ischemic changes on EKG.the patient states that since he has been discharged he has been very weak, dyspneic with exertion, somnolent and fatigue. He mentions that according to his wife he has not been eating like usual. He denies fever, chills, rhinorrhea, sore throat, but has been having a dry cough. He denies chest pain, palpitations, dizziness, diaphoresis, PND, orthopnea or pitting edema of the lower extremities. He has some lower abdomen pain yesterday, but no longer there on palpation. He denies nausea, emesis, diarrhea, constipation, melena or hematochezia. No flank pain, dysuria, frequency or hematuria. Denies polyuria, polydipsia, polyphagia or blurred vision.  4/21:Patient was admitted with persistent weakness with questionable symptomatic iron deficiency anemia as well as some noted hyponatremia. He has not had any overt bleeding and hemoglobin levels appear to remain stable. GI evaluation pending.  Continue on IV normal saline with further evaluation of hyponatremia ordered and pending.  4/22: Unfortunately, patient vomited most of his GoLYTELY prep this morning and he had further enemas and MiraLAX, but still is not ready for colonoscopy.  He will have further prep this evening with colonoscopy anticipated for a.m.  He continues to have some hyponatremia for which IV normal saline has been initiated.  Cortisol levels are within normal limits.  Continue to decrease blood pressure medication dosing with metoprolol now 12.5 mg twice daily.  PT evaluation still pending.  4/23: Patient has undergone EGD and colonoscopy this morning.  EGD with findings of reflux erosive esophagitis with biopsy performed and no signs of active bleeding noted.  There is possibility of H. pylori infection here which will be reviewed on pathology.  Additionally, colonoscopy revealed diverticulosis with no active bleeding and polyps which were biopsied.  He will need to follow-up with Dr. Dionicia Abler his gastroenterologist in the outpatient setting to review the test results.  He has otherwise done well throughout the course of this admission and multiple studies to include his thyroid, B12, cortisol levels have all remained within normal limits.  It appears that much of his symptomatology may be related to his home medications of hydrochlorothiazide and metoprolol.  He will no longer remain on hydrochlorothiazide as I believe this is contributing to his hyponatremia.  Additionally, he will remain on lower doses of home metoprolol as noted above and hopefully this will help with his symptoms.  His anemia remained stable and he should continue his usual iron supplementation.  Discharge Diagnoses:  Principal Problem:   Symptomatic anemia Active Problems:   HTN (hypertension)   Carotid artery disease without cerebral infarction (HCC)   Hyperlipidemia   Heme positive stool   Prolonged QT interval   Hyponatremia  Hypoalbuminemia  due to protein-calorie malnutrition (HCC)   Leukocytosis   Weakness  Principal discharge diagnosis: Generalized weakness likely secondary to hypotension and mild hyponatremia along with poor oral intake.  Discharge Instructions  Discharge Instructions    Diet - low sodium heart healthy   Complete by: As directed    Increase activity slowly   Complete by: As directed      Allergies as of 01/26/2020   No Known Allergies     Medication List    STOP taking these medications   hydrochlorothiazide 25 MG tablet Commonly known as: HYDRODIURIL     TAKE these medications   aspirin 81 MG tablet Take 81 mg by mouth daily.   atorvastatin 20 MG tablet Commonly known as: LIPITOR 1 tablet daily.   benzonatate 100 MG capsule Commonly known as: TESSALON Take 100 mg by mouth 4 (four) times daily as needed for cough.   docusate sodium 100 MG capsule Commonly known as: Colace Take 1 capsule (100 mg total) by mouth 2 (two) times daily.   ferrous sulfate 325 (65 FE) MG tablet Take 1 tablet (325 mg total) by mouth daily. What changed: when to take this   fish oil-omega-3 fatty acids 1000 MG capsule Take 1 g by mouth daily.   metoprolol tartrate 25 MG tablet Commonly known as: LOPRESSOR Take 0.5 tablets (12.5 mg total) by mouth 2 (two) times daily. What changed:   medication strength  how much to take   niacin 500 MG tablet Take 500 mg by mouth daily with breakfast.   vitamin C 250 MG tablet Commonly known as: ASCORBIC ACID Take 1 tablet (250 mg total) by mouth daily. Take with iron supplementation.      Follow-up Information    [name], MD Follow up in 1 week(s).   Specialty: Internal Medicine Contact information: [address] [place] [address] [phone]        Malissa Hippo, MD Follow up in 2 week(s).   Specialty: Gastroenterology Contact information: [address], [address] [place] [address] [phone]          No Known  Allergies  Consultations:  GI   Procedures/Studies: DG Chest 2 View  Result Date: 01/21/2020 CLINICAL DATA:  Pneumonia 2 months ago. Dry cough. EXAM: CHEST - 2 VIEW COMPARISON:  Chest x-ray dated 12/25/2019. FINDINGS: Improved aeration at the RIGHT lung base/costophrenic angle. No new opacity. Lungs are hyperexpanded. Chronic bronchitic changes noted centrally. Heart size and mediastinal contours are within normal limits. No acute or suspicious osseous finding. IMPRESSION: 1. No active cardiopulmonary disease. No evidence of pneumonia or pulmonary edema on today's exam. 2. Improved aeration at the RIGHT lung base compared to the previous chest x-ray of 12/25/2019. 3. Hyperexpanded lungs indicating COPD. Associated chronic bronchitic changes centrally. Electronically Signed   By: Bary Richard M.D.   On: 01/21/2020 17:09   CT Chest W Contrast  Result Date: 01/23/2020 CLINICAL DATA:  85 year old male with generalized weakness. EXAM: CT CHEST WITH CONTRAST TECHNIQUE: Multidetector CT imaging of the chest was performed during intravenous contrast administration. CONTRAST:  47mL OMNIPAQUE IOHEXOL 300 MG/ML  SOLN COMPARISON:  Chest radiograph dated 01/23/2020. Chest CT dated 12/29/2019 FINDINGS: Cardiovascular: There is no cardiomegaly or pericardial effusion. Coronary vascular calcification primarily involving the LAD and left circumflex artery. There is moderate atherosclerotic calcification of the thoracic aorta. No aneurysmal dilatation or dissection. Evaluation of the pulmonary arteries is limited due to suboptimal opacification. No definite pulmonary artery embolus identified.  Mediastinum/Nodes: There is no hilar or mediastinal adenopathy. Right hilar lymph node measures 7 mm in short axis. The esophagus is grossly unremarkable. No mediastinal fluid collection. Lungs/Pleura: There is a small right pleural effusion, decreased in size since the prior CT. Right upper lobe streaky and nodular densities  most consistent with pneumonia. Overall improvement of the right upper lobe densities since the prior CT. Clinical correlation and continued follow-up recommended. Additional nodular densities in the left apex and left lower lobe. There is no pneumothorax. The central airways are patent. There is mild bronchial wall thickening most consistent with bronchitis. Biapical scarring noted. Upper Abdomen: Small gallstones. Musculoskeletal: Degenerative changes of the spine. No acute osseous pathology. IMPRESSION: 1. Right upper lobe pneumonia, improved since the prior CT. Clinical correlation and continued follow-up recommended. 2. Small right pleural effusion, decreased in size since the prior CT. 3. Mild bronchial wall thickening most consistent with bronchitis. 4. Cholelithiasis. 5. Aortic Atherosclerosis (ICD10-I70.0). Electronically Signed   By: Elgie Collard M.D.   On: 01/23/2020 17:23   CT CHEST W CONTRAST  Result Date: 12/29/2019 CLINICAL DATA:  Weakness and fatigue. Status post recent second COVID flu shot. EXAM: CT CHEST WITH CONTRAST TECHNIQUE: Multidetector CT imaging of the chest was performed during intravenous contrast administration. CONTRAST:  66mL OMNIPAQUE IOHEXOL 300 MG/ML  SOLN COMPARISON:  None. FINDINGS: Cardiovascular: There is moderate severity calcification of the thoracic aorta. Normal heart size. No pericardial effusion. Marked severity coronary artery calcification is seen. Mediastinum/Nodes: No enlarged mediastinal, hilar, or axillary lymph nodes. Thyroid gland, trachea, and esophagus demonstrate no significant findings. Lungs/Pleura: Mild linear scarring and/or atelectasis is seen along the posterior aspects of the bilateral apices. Mild to moderate severity atelectasis and/or infiltrate is seen within the posterior aspect of the right lower lobe. There is a small right pleural effusion. This appears to be partially loculated in nature. No pneumothorax is identified. Upper Abdomen:  Tiny gallstones are seen within the gallbladder lumen. Musculoskeletal: Degenerative changes seen throughout the thoracic spine. IMPRESSION: 1. Mild to moderate severity atelectasis and/or infiltrate within the posterior aspect of the right lower lobe. 2. Small right pleural effusion. 3. Marked severity coronary artery calcification. 4. Cholelithiasis. Aortic Atherosclerosis (ICD10-I70.0). Electronically Signed   By: Aram Candela M.D.   On: 12/29/2019 16:25   DG Chest Portable 1 View  Result Date: 01/23/2020 CLINICAL DATA:  Progressive weakness. Recent history of pneumonia. EXAM: PORTABLE CHEST 1 VIEW COMPARISON:  Chest x-ray dated 01/19/2020 and chest CT dated 12/29/2019 FINDINGS: The heart size and pulmonary vascularity are normal. Aortic atherosclerosis. Chronic blunting of the right costophrenic angle laterally. Chronic slight parenchymal scarring in the right midzone. Left lung is clear. No acute bone abnormality. Moderate arthritic changes of both glenohumeral joints. IMPRESSION: 1. No acute abnormalities. 2.  Aortic Atherosclerosis (ICD10-I70.0). Electronically Signed   By: Francene Boyers M.D.   On: 01/23/2020 13:45   ECHOCARDIOGRAM COMPLETE  Result Date: 01/01/2020    ECHOCARDIOGRAM REPORT   Patient Name:   JAEL KOSTICK Date of Exam: 01/01/2020 Medical Rec #:  [id number]       Height:       72.0 in Accession #:    [id number]      Weight:       224.9 lb Date of Birth:  1934/10/28        BSA:          2.239 m Patient Age:    84 years        BP:  148/91 mmHg Patient Gender: M               HR:           91 bpm. [facility] Procedure: 2D Echo, Cardiac Doppler and Color Doppler Indications:    Murmur  History:        Patient has no prior history of Echocardiogram examinations.                 COPD, Signs/Symptoms:Dyspnea, Murmur and Pneumonia; Risk                 Factors:Hypertension and Dyslipidemia.  Sonographer:    Lavenia Atlas RDCS Referring Phys: 59 MICHAEL E  NORINS  Sonographer Comments: Technically challenging study due to limited acoustic windows. Image acquisition challenging due to COPD. IMPRESSIONS  1. Left ventricular ejection fraction, by estimation, is 60 to 65%. The left ventricle has normal function. The left ventricle has no regional wall motion abnormalities. There is mild left ventricular hypertrophy. Left ventricular diastolic parameters were normal.  2. Right ventricular systolic function is normal. The right ventricular size is normal.  3. The mitral valve is normal in structure. No evidence of mitral valve regurgitation. No evidence of mitral stenosis.  4. The aortic valve is tricuspid. Aortic valve regurgitation is not visualized. Mild aortic valve stenosis.  5. The inferior vena cava is normal in size with greater than 50% respiratory variability, suggesting right atrial pressure of 3 mmHg. FINDINGS  Left Ventricle: Left ventricular ejection fraction, by estimation, is 60 to 65%. The left ventricle has normal function. The left ventricle has no regional wall motion abnormalities. The left ventricular internal cavity size was normal in size. There is  mild left ventricular hypertrophy. Left ventricular diastolic parameters were normal. Right Ventricle: The right ventricular size is normal. No increase in right ventricular wall thickness. Right ventricular systolic function is normal. Left Atrium: Left atrial size was normal in size. Right Atrium: Right atrial size was normal in size. Pericardium: There is no evidence of pericardial effusion. Mitral Valve: The mitral valve is normal in structure. Normal mobility of the mitral valve leaflets. No evidence of mitral valve regurgitation. No evidence of mitral valve stenosis. Tricuspid Valve: The tricuspid valve is normal in structure. Tricuspid valve regurgitation is trivial. No evidence of tricuspid stenosis. Aortic Valve: The aortic valve is tricuspid. . There is moderate thickening and moderate  calcification of the aortic valve. Aortic valve regurgitation is not visualized. Mild aortic stenosis is present. There is moderate thickening of the aortic valve. There is moderate calcification of the aortic valve. Aortic valve mean gradient measures 10.0 mmHg. Aortic valve peak gradient measures 21.0 mmHg. Aortic valve area, by VTI measures 1.47 cm. Pulmonic Valve: The pulmonic valve was normal in structure. Pulmonic valve regurgitation is not visualized. No evidence of pulmonic stenosis. Aorta: The aortic root is normal in size and structure. Venous: The inferior vena cava is normal in size with greater than 50% respiratory variability, suggesting right atrial pressure of 3 mmHg. IAS/Shunts: No atrial level shunt detected by color flow Doppler.  LEFT VENTRICLE PLAX 2D LVIDd:         4.32 cm  Diastology LVIDs:         3.16 cm  LV e' lateral:   6.53 cm/s LV PW:         1.31 cm  LV E/e' lateral: 14.0 LV IVS:        1.48 cm  LV e' medial:  7.62 cm/s LVOT diam:     2.00 cm  LV E/e' medial:  12.0 LV SV:         63 LV SV Index:   28 LVOT Area:     3.14 cm  RIGHT VENTRICLE RV Basal diam:  2.54 cm TAPSE (M-mode): 2.7 cm LEFT ATRIUM             Index LA diam:        3.30 cm 1.47 cm/m LA Vol (A2C):   71.0 ml 31.71 ml/m LA Vol (A4C):   55.3 ml 24.69 ml/m LA Biplane Vol: 63.7 ml 28.45 ml/m  AORTIC VALVE AV Area (Vmax):    1.31 cm AV Area (Vmean):   1.53 cm AV Area (VTI):     1.47 cm AV Vmax:           229.33 cm/s AV Vmean:          145.000 cm/s AV VTI:            0.428 m AV Peak Grad:      21.0 mmHg AV Mean Grad:      10.0 mmHg LVOT Vmax:         95.30 cm/s LVOT Vmean:        70.400 cm/s LVOT VTI:          0.200 m LVOT/AV VTI ratio: 0.47  AORTA Ao Root diam: 2.70 cm MITRAL VALVE MV Area (PHT): 4.15 cm    SHUNTS MV Decel Time: 183 msec    Systemic VTI:  0.20 m MV E velocity: 91.70 cm/s  Systemic Diam: 2.00 cm MV A velocity: 94.70 cm/s MV E/A ratio:  0.97 Charlton Haws MD Electronically signed by Charlton Haws MD  Signature Date/Time: 01/01/2020/12:37:18 PM    Final      Discharge Exam: Vitals:   01/26/20 1015 01/26/20 1125  BP: (!) 113/57 119/66  Pulse: 93 97  Resp: (!) 29 18  Temp:    SpO2: 98% 100%   Vitals:   01/26/20 1000 01/26/20 1013 01/26/20 1015 01/26/20 1125  BP: (!) 115/57  (!) 113/57 119/66  Pulse: 94  93 97  Resp: (!) 28  (!) 29 18  Temp:  98.2 F (36.8 C)    TempSrc:      SpO2:  100% 98% 100%  Weight:      Height:        General: Pt is alert, awake, not in acute distress Cardiovascular: RRR, S1/S2 +, no rubs, no gallops Respiratory: CTA bilaterally, no wheezing, no rhonchi Abdominal: Soft, NT, ND, bowel sounds + Extremities: no edema, no cyanosis    The results of significant diagnostics from this hospitalization (including imaging, microbiology, ancillary and laboratory) are listed below for reference.     Microbiology: Recent Results (from the past 240 hour(s))  Blood culture (routine x 2)     Status: None (Preliminary result)   Collection Time: 01/23/20  5:30 PM   Specimen: Left Antecubital; Blood  Result Value Ref Range Status   Specimen Description LEFT ANTECUBITAL  Final   Special Requests   Final    BOTTLES DRAWN AEROBIC AND ANAEROBIC Blood Culture adequate volume   Culture   Final    NO GROWTH 3 DAYS Performed at [facility], [address], [place], [place] [address]    Report Status PENDING  Incomplete  Blood culture (routine x 2)     Status: None (Preliminary result)   Collection Time: 01/23/20  5:30 PM   Specimen:  BLOOD RIGHT HAND  Result Value Ref Range Status   Specimen Description BLOOD RIGHT HAND  Final   Special Requests   Final    BOTTLES DRAWN AEROBIC AND ANAEROBIC Blood Culture adequate volume   Culture   Final    NO GROWTH 3 DAYS Performed at [facility], [address] [place], [place] [address]    Report Status PENDING  Incomplete  SARS CORONAVIRUS 2 (TAT 6-24 HRS) Nasopharyngeal Nasopharyngeal Swab     Status: None    Collection Time: 01/23/20  7:23 PM   Specimen: Nasopharyngeal Swab  Result Value Ref Range Status   SARS Coronavirus 2 NEGATIVE NEGATIVE Final    Comment: (NOTE) SARS-CoV-2 target nucleic acids are NOT DETECTED. The SARS-CoV-2 RNA is generally detectable in upper and lower respiratory specimens during the acute phase of infection. Negative results do not preclude SARS-CoV-2 infection, do not rule out co-infections with other pathogens, and should not be used as the sole basis for treatment or other patient management decisions. Negative results must be combined with clinical observations, patient history, and epidemiological information. The expected result is Negative. Fact Sheet for Patients: SugarRoll.be Fact Sheet for Healthcare Providers: https://www.woods-mathews.com/ This test is not yet approved or cleared by the [place] FDA and  has been authorized for detection and/or diagnosis of SARS-CoV-2 by FDA under an Emergency Use Authorization (EUA). This EUA will remain  in effect (meaning this test can be used) for the duration of the COVID-19 declaration under Section 56 4(b)(1) of the Act, 21 U.S.C. section 360bbb-3(b)(1), unless the authorization is terminated or revoked sooner. Performed at [facility], [place] [address], [place], [place] [address]      Labs: BNP (last 3 results) No results for input(s): BNP in the last 8760 hours. Basic Metabolic Panel: Recent Labs  Lab 01/23/20 1319 01/24/20 0802 01/25/20 0516 01/26/20 0454  NA 130* 128* 129* 129*  K 3.7 3.4* 4.3 3.6  CL 91* 91* 94* 96*  CO2 31 27 26 25   GLUCOSE 111* 106* 110* 113*  BUN 17 16 16 14   CREATININE 0.82 0.84 0.76 0.72  CALCIUM 7.9* 7.8* 7.8* 7.5*  MG 1.9  --  1.9  --    Liver Function Tests: Recent Labs  Lab 01/23/20 1319 01/24/20 0802 01/25/20 0516 01/26/20 0454  AST 30 35 49* 31  ALT 31 32 37 28  ALKPHOS 150* 154* 160* 126  BILITOT 0.5  0.7 0.8 0.4  PROT 6.5 6.4* 6.0* 5.5*  ALBUMIN 1.9* 1.7* 1.7* 1.6*   No results for input(s): LIPASE, AMYLASE in the last 168 hours. No results for input(s): AMMONIA in the last 168 hours. CBC: Recent Labs  Lab 01/23/20 1319 01/23/20 1941 01/24/20 0117 01/24/20 0802 01/25/20 0516  WBC 18.4*  --   --  21.0* 19.9*  NEUTROABS 15.3*  --   --   --   --   HGB 9.5* 9.3* 8.8* 9.3* 8.9*  HCT 30.4* 28.8* 27.2* 29.6* 28.8*  MCV 90.5  --   --  90.2 90.6  PLT 429*  --   --  464* 340   Cardiac Enzymes: No results for input(s): CKTOTAL, CKMB, CKMBINDEX, TROPONINI in the last 168 hours. BNP: Invalid input(s): POCBNP CBG: No results for input(s): GLUCAP in the last 168 hours. D-Dimer No results for input(s): DDIMER in the last 72 hours. Hgb A1c No results for input(s): HGBA1C in the last 72 hours. Lipid Profile No results for input(s): CHOL, HDL, LDLCALC, TRIG, CHOLHDL,  LDLDIRECT in the last 72 hours. Thyroid function studies Recent Labs    01/23/20 1945  TSH 1.414   Anemia work up Recent Labs    01/24/20 0802  VITAMINB12 354  FOLATE 4.0*  FERRITIN 1,910*  TIBC 137*  IRON 114  RETICCTPCT 2.0   Urinalysis    Component Value Date/Time   COLORURINE YELLOW 01/23/2020 1404   APPEARANCEUR CLEAR 01/23/2020 1404   LABSPEC 1.017 01/23/2020 1404   PHURINE 7.0 01/23/2020 1404   GLUCOSEU NEGATIVE 01/23/2020 1404   HGBUR NEGATIVE 01/23/2020 1404   BILIRUBINUR NEGATIVE 01/23/2020 1404   KETONESUR NEGATIVE 01/23/2020 1404   PROTEINUR NEGATIVE 01/23/2020 1404   UROBILINOGEN 0.2 03/28/2009 0611   NITRITE NEGATIVE 01/23/2020 1404   LEUKOCYTESUR NEGATIVE 01/23/2020 1404   Sepsis Labs Invalid input(s): PROCALCITONIN,  WBC,  LACTICIDVEN Microbiology Recent Results (from the past 240 hour(s))  Blood culture (routine x 2)     Status: None (Preliminary result)   Collection Time: 01/23/20  5:30 PM   Specimen: Left Antecubital; Blood  Result Value Ref Range Status   Specimen Description  LEFT ANTECUBITAL  Final   Special Requests   Final    BOTTLES DRAWN AEROBIC AND ANAEROBIC Blood Culture adequate volume   Culture   Final    NO GROWTH 3 DAYS Performed at [facility], [address] [place], [place] [address]    Report Status PENDING  Incomplete  Blood culture (routine x 2)     Status: None (Preliminary result)   Collection Time: 01/23/20  5:30 PM   Specimen: BLOOD RIGHT HAND  Result Value Ref Range Status   Specimen Description BLOOD RIGHT HAND  Final   Special Requests   Final    BOTTLES DRAWN AEROBIC AND ANAEROBIC Blood Culture adequate volume   Culture   Final    NO GROWTH 3 DAYS Performed at [facility], [address] [place], [place] [address]    Report Status PENDING  Incomplete  SARS CORONAVIRUS 2 (TAT 6-24 HRS) Nasopharyngeal Nasopharyngeal Swab     Status: None   Collection Time: 01/23/20  7:23 PM   Specimen: Nasopharyngeal Swab  Result Value Ref Range Status   SARS Coronavirus 2 NEGATIVE NEGATIVE Final    Comment: (NOTE) SARS-CoV-2 target nucleic acids are NOT DETECTED. The SARS-CoV-2 RNA is generally detectable in upper and lower respiratory specimens during the acute phase of infection. Negative results do not preclude SARS-CoV-2 infection, do not rule out co-infections with other pathogens, and should not be used as the sole basis for treatment or other patient management decisions. Negative results must be combined with clinical observations, patient history, and epidemiological information. The expected result is Negative. Fact Sheet for Patients: HairSlick.nohttps://www.fda.gov/media/138098/download Fact Sheet for Healthcare Providers: quierodirigir.comhttps://www.fda.gov/media/138095/download This test is not yet approved or cleared by the [place] FDA and  has been authorized for detection and/or diagnosis of SARS-CoV-2 by FDA under an Emergency Use Authorization (EUA). This EUA will remain  in effect (meaning this test can be used) for the duration of  the COVID-19 declaration under Section 56 4(b)(1) of the Act, 21 U.S.C. section 360bbb-3(b)(1), unless the authorization is terminated or revoked sooner. Performed at [facility], [address] [place], [place] [address]      Time coordinating discharge: 35 minutes  SIGNED:   Erick BlinksPratik D Ishaq Maffei, DO [facility] 01/26/2020, 12:36 PM  If 7PM-7AM, please contact night-coverage [email or web address]

## 2020-01-26 NOTE — Plan of Care (Signed)

## 2020-01-26 NOTE — Evaluation (Signed)
Physical Therapy Evaluation Patient Details Name: Jeffrey Ho MRN: [id number] DOB: 13-Apr-1935 Today's Date: 01/26/2020   History of Present Illness  Jeffrey Ho is a 85 y.o. male with medical history significant of right eye blindness, carotid artery occlusion, impaired hearing, hyperlipidemia, hypertension who was admitted from 12/31/2019 until 01/02/2020 due to community-acquired pneumonia and ischemic changes on EKG. the patient states that since he has been discharged he has been very weak, dyspneic with exertion, somnolent and fatigue.  He mentions that according to his wife he has not been eating like usual.  He denies fever, chills, rhinorrhea, sore throat, but has been having a dry cough.  He denies chest pain, palpitations, dizziness, diaphoresis, PND, orthopnea or pitting edema of the lower extremities.  He has some lower abdomen pain yesterday, but no longer there on palpation.  He denies nausea, emesis, diarrhea, constipation, melena or hematochezia.  No flank pain, dysuria, frequency or hematuria.  Denies polyuria, polydipsia, polyphagia or blurred vision._0   Clinical Impression  Patient functioning near recent baseline but demonstrates BLE weakness, fatigue and impaired activity tolerance since recent hospital admission several months ago. Patient able to complete mobility safely but with slow, labored movements. He requires min guard for safety/balance with standing and gait. He tolerates ambulation well today while stating that he ambulated further today then he did in the last several weeks. Patient and wife are educated on remaining active and participating with PT after discharge to improve function and strength. Patient discharged to care of nursing for ambulation daily as tolerated for length of stay.      Follow Up Recommendations Home health PT    Equipment Recommendations  3in1 (PT)    Recommendations for Other Services       Precautions / Restrictions  Precautions Precautions: Fall Restrictions Weight Bearing Restrictions: No      Mobility  Bed Mobility Overal bed mobility: Modified Independent             General bed mobility comments: slow, labored, uses bed rails  Transfers Overall transfer level: Needs assistance Equipment used: Rolling walker (2 wheeled) Transfers: Sit to/from [facility] Sit to Stand: Min guard Stand pivot transfers: Min guard       General transfer comment: to transition to standing with RW  Ambulation/Gait Ambulation/Gait assistance: Min guard Gait Distance (Feet): 100 Feet Assistive device: Rolling walker (2 wheeled) Gait Pattern/deviations: Decreased step length - right;Step-through pattern;Decreased step length - left;Decreased stride length;Trunk flexed Gait velocity: decreased   General Gait Details: slow, labored, no loss of balance with RW, guard for safety and balance  Stairs            Wheelchair Mobility    Modified Rankin (Stroke Patients Only)       Balance Overall balance assessment: Needs assistance Sitting-balance support: Feet supported;No upper extremity supported Sitting balance-Leahy Scale: Good Sitting balance - Comments: seated EOB   Standing balance support: During functional activity;Bilateral upper extremity supported Standing balance-Leahy Scale: Fair Standing balance comment: requires RW                             Pertinent Vitals/Pain Pain Assessment: No/denies pain    Home Living Family/patient expects to be discharged to:: Private residence Living Arrangements: Spouse/significant other Available Help at Discharge: Family Type of Home: House Home Access: Stairs to enter Entrance Stairs-Rails: None Entrance Stairs-Number of Steps: 2 Home Layout: One level;Able to live on main level  with bedroom/bathroom Home Equipment: Walker - 4 wheels;Walker - 2 wheels;Cane - single point      Prior Function Level of  Independence: Needs assistance   Gait / Transfers Assistance Needed: Patient states household ambulator with [facility] since recent admission  ADL's / Homemaking Assistance Needed: Wife assist recently due to progressive weakness        Hand Dominance        Extremity/Trunk Assessment   Upper Extremity Assessment Upper Extremity Assessment: Generalized weakness    Lower Extremity Assessment Lower Extremity Assessment: Generalized weakness    Cervical / Trunk Assessment Cervical / Trunk Assessment: Normal  Communication   Communication: HOH;No difficulties  Cognition Arousal/Alertness: Awake/alert Behavior During Therapy: WFL for tasks assessed/performed Overall Cognitive Status: Within Functional Limits for tasks assessed                                        General Comments      Exercises     Assessment/Plan    PT Assessment All further PT needs can be met in the next venue of care  PT Problem List Decreased strength;Decreased activity tolerance;Decreased balance;Decreased mobility       PT Treatment Interventions      PT Goals (Current goals can be found in the Care Plan section)  Acute Rehab PT Goals Patient Stated Goal: Return home with wife to assist PT Goal Formulation: With patient Time For Goal Achievement: 01/26/20 Potential to Achieve Goals: Good    Frequency     Barriers to discharge        Co-evaluation               AM-PAC PT "6 Clicks" Mobility  Outcome Measure Help needed turning from your back to your side while in a flat bed without using bedrails?: None Help needed moving from lying on your back to sitting on the side of a flat bed without using bedrails?: None Help needed moving to and from a bed to a chair (including a wheelchair)?: A Little Help needed standing up from a chair using your arms (e.g., wheelchair or bedside chair)?: None Help needed to walk in hospital room?: A Little Help needed climbing 3-5  steps with a railing? : A Little 6 Click Score: 21    End of Session Equipment Utilized During Treatment: Gait belt Activity Tolerance: Patient tolerated treatment well Patient left: in bed;with family/visitor present;with call bell/phone within reach Nurse Communication: Mobility status PT Visit Diagnosis: Unsteadiness on feet (R26.81);Other abnormalities of gait and mobility (R26.89);Muscle weakness (generalized) (M62.81)    Time: 5809-9833 PT Time Calculation (min) (ACUTE ONLY): 19 min   Charges:   PT Evaluation $PT Eval Moderate Complexity: 1 Mod PT Treatments $Therapeutic Activity: 8-22 mins        2:03 PM, 01/26/20 Mearl Latin PT, DPT Physical Therapist at [facility]

## 2020-01-26 NOTE — Op Note (Signed)
[facility] Patient Name: Jeffrey Ho Procedure Date: 01/26/2020 9:10 AM MRN: [id number] Date of Birth: 1935-09-29 Attending MD: Norvel Richards , MD CSN: [id number] Age: 85 Admit Type: Outpatient Procedure:                Colonoscopy Indications:              Heme positive stool Providers:                Norvel Richards, MD, Gwynneth Albright RN,                            RN, Raphael Gibney, Technician Referring MD:              Medicines:                Propofol per Anesthesia Complications:            No immediate complications. Estimated Blood Loss:     Estimated blood loss was minimal. Procedure:                Pre-Anesthesia Assessment:                           - Prior to the procedure, a History and Physical                            was performed, and patient medications and                            allergies were reviewed. The patient's tolerance of                            previous anesthesia was also reviewed. The risks                            and benefits of the procedure and the sedation                            options and risks were discussed with the patient.                            All questions were answered, and informed consent                            was obtained. Prior Anticoagulants: The patient has                            taken no previous anticoagulant or antiplatelet                            agents. ASA Grade Assessment: III - A patient with                            severe systemic disease. After reviewing the risks  and benefits, the patient was deemed in                            satisfactory condition to undergo the procedure.                           After obtaining informed consent, the colonoscope                            was passed under direct vision. Throughout the                            procedure, the patient's blood pressure, pulse, and                            oxygen  saturations were monitored continuously. The                            CF-[id number] ([id number]) scope was introduced through                            the anus and advanced to the 5 cm into the ileum.                            The colonoscopy was performed without difficulty.                            The patient tolerated the procedure well. The                            quality of the bowel preparation was adequate. The                            terminal ileum, ileocecal valve, appendiceal                            orifice, and rectum were photographed. Scope In: 9:11:55 AM Scope Out: 9:45:52 AM Scope Withdrawal Time: 0 hours 20 minutes 11 seconds  Total Procedure Duration: 0 hours 33 minutes 57 seconds  Findings:      The perianal and digital rectal examinations were normal.      Scattered medium-mouthed diverticula were found in the sigmoid colon.       Redundant colon. External abdominal pressure required to reach the cecum.      Two sessile polyps were found in the sigmoid colon and splenic flexure.       The polyps were 4 to 6 mm in size. These polyps were removed with a cold       snare. Resection and retrieval were complete. Estimated blood loss was       minimal.      Non-bleeding internal hemorrhoids were found during retroflexion. The       hemorrhoids were mild, small and Grade I (internal hemorrhoids that do       not prolapse).      The exam was otherwise without abnormality on direct and retroflexion  views. The distal 5 cm of terminal ileum appeared normal. Impression:               - Diverticulosis in the sigmoid colon. Redundant                            colon. Normal terminal ileum.                           - Two 4 to 6 mm polyps in the sigmoid colon and at                            the splenic flexure, removed with a cold snare.                            Resected and retrieved.                           - Non-bleeding internal hemorrhoids.                            - The examination was otherwise normal on direct                            and retroflexion views. Moderate Sedation:      Moderate (conscious) sedation was personally administered by an       anesthesia professional. The following parameters were monitored: oxygen       saturation, heart rate, blood pressure, respiratory rate, EKG, adequacy       of pulmonary ventilation, and response to care. Recommendation:           - Patient has a contact number available for                            emergencies. The signs and symptoms of potential                            delayed complications were discussed with the                            patient. Return to normal activities tomorrow.                            Written discharge instructions were provided to the                            patient.                           - Cardiac diet.                           - Continue present medications.                           - Repeat colonoscopy after studies are complete for  surveillance.                           - Return patient to hospital ward for ongoing care.                           - Advance diet as tolerated. Follow-up pathology.                            See EGD report. Agree with further evaluation of                            non-GI causes of anemia. At patient request, I                            called [name], wife, [phone] and reviewed GI                            findings in detail. Procedure Code(s):        --- Professional ---                           [phone], Colonoscopy, flexible; with removal of                            tumor(s), polyp(s), or other lesion(s) by snare                            technique Diagnosis Code(s):        --- Professional ---                           K64.0, First degree hemorrhoids                           K63.5, Polyp of colon                           R19.5, Other fecal abnormalities                            K57.30, Diverticulosis of large intestine without                            perforation or abscess without bleeding CPT copyright 2019 [employer]. All rights reserved. The codes documented in this report are preliminary and upon coder review may  be revised to meet current compliance requirements. Gerrit Friends. Mkenzie Dotts, MD Gennette Pac, MD 01/26/2020 9:54:05 AM This report has been signed electronically. Number of Addenda: 0

## 2020-01-28 DIAGNOSIS — D649 Anemia, unspecified: Secondary | ICD-10-CM | POA: Diagnosis not present

## 2020-01-28 DIAGNOSIS — J189 Pneumonia, unspecified organism: Secondary | ICD-10-CM | POA: Diagnosis not present

## 2020-01-28 DIAGNOSIS — I959 Hypotension, unspecified: Secondary | ICD-10-CM | POA: Diagnosis not present

## 2020-01-28 DIAGNOSIS — E871 Hypo-osmolality and hyponatremia: Secondary | ICD-10-CM | POA: Diagnosis not present

## 2020-01-28 LAB — CULTURE, BLOOD (ROUTINE X 2)
Culture: NO GROWTH
Culture: NO GROWTH
Special Requests: ADEQUATE
Special Requests: ADEQUATE

## 2020-01-29 ENCOUNTER — Other Ambulatory Visit: Payer: Self-pay

## 2020-01-29 LAB — SURGICAL PATHOLOGY

## 2020-01-30 ENCOUNTER — Encounter: Payer: Self-pay | Admitting: Internal Medicine

## 2020-02-02 DIAGNOSIS — K922 Gastrointestinal hemorrhage, unspecified: Secondary | ICD-10-CM | POA: Diagnosis not present

## 2020-02-02 DIAGNOSIS — R5383 Other fatigue: Secondary | ICD-10-CM | POA: Diagnosis not present

## 2020-02-02 DIAGNOSIS — Z6830 Body mass index (BMI) 30.0-30.9, adult: Secondary | ICD-10-CM | POA: Diagnosis not present

## 2020-02-02 DIAGNOSIS — D649 Anemia, unspecified: Secondary | ICD-10-CM | POA: Diagnosis not present

## 2020-02-03 DIAGNOSIS — K626 Ulcer of anus and rectum: Secondary | ICD-10-CM

## 2020-02-03 HISTORY — DX: Ulcer of anus and rectum: K62.6

## 2020-02-06 ENCOUNTER — Encounter (INDEPENDENT_AMBULATORY_CARE_PROVIDER_SITE_OTHER): Payer: Self-pay

## 2020-02-06 ENCOUNTER — Telehealth: Payer: Self-pay | Admitting: Internal Medicine

## 2020-02-06 NOTE — Telephone Encounter (Signed)
[phone] please  Call patient, wife stated he is having issues and he is supposed to follow up with dr. Karilyn Cota but they can not get an appointment with him for several months

## 2020-02-06 NOTE — Telephone Encounter (Signed)
Spoke with pts spouse. Pt has an appointment since leaving a message with our office. The call has been addressed by other office.

## 2020-02-11 ENCOUNTER — Emergency Department (HOSPITAL_COMMUNITY): Payer: Medicare Other

## 2020-02-11 ENCOUNTER — Inpatient Hospital Stay (HOSPITAL_COMMUNITY): Payer: Medicare Other

## 2020-02-11 ENCOUNTER — Encounter (HOSPITAL_COMMUNITY): Payer: Self-pay | Admitting: Emergency Medicine

## 2020-02-11 ENCOUNTER — Other Ambulatory Visit: Payer: Self-pay

## 2020-02-11 ENCOUNTER — Inpatient Hospital Stay (HOSPITAL_COMMUNITY)
Admission: EM | Admit: 2020-02-11 | Discharge: 2020-02-19 | DRG: 811 | Disposition: A | Discharge status: Skilled Nursing Facility | Payer: Medicare Other | Attending: Internal Medicine | Admitting: Internal Medicine

## 2020-02-11 DIAGNOSIS — Z20822 Contact with and (suspected) exposure to covid-19: Secondary | ICD-10-CM | POA: Diagnosis present

## 2020-02-11 DIAGNOSIS — G319 Degenerative disease of nervous system, unspecified: Secondary | ICD-10-CM | POA: Diagnosis present

## 2020-02-11 DIAGNOSIS — N141 Nephropathy induced by other drugs, medicaments and biological substances: Secondary | ICD-10-CM | POA: Diagnosis present

## 2020-02-11 DIAGNOSIS — Z6827 Body mass index (BMI) 27.0-27.9, adult: Secondary | ICD-10-CM

## 2020-02-11 DIAGNOSIS — Z8701 Personal history of pneumonia (recurrent): Secondary | ICD-10-CM

## 2020-02-11 DIAGNOSIS — E43 Unspecified severe protein-calorie malnutrition: Secondary | ICD-10-CM | POA: Diagnosis present

## 2020-02-11 DIAGNOSIS — I10 Essential (primary) hypertension: Secondary | ICD-10-CM | POA: Diagnosis present

## 2020-02-11 DIAGNOSIS — I7789 Other specified disorders of arteries and arterioles: Secondary | ICD-10-CM | POA: Diagnosis present

## 2020-02-11 DIAGNOSIS — Z8249 Family history of ischemic heart disease and other diseases of the circulatory system: Secondary | ICD-10-CM

## 2020-02-11 DIAGNOSIS — R0602 Shortness of breath: Secondary | ICD-10-CM

## 2020-02-11 DIAGNOSIS — I700 Atherosclerosis of aorta: Secondary | ICD-10-CM | POA: Diagnosis present

## 2020-02-11 DIAGNOSIS — K8020 Calculus of gallbladder without cholecystitis without obstruction: Secondary | ICD-10-CM | POA: Diagnosis present

## 2020-02-11 DIAGNOSIS — E8809 Other disorders of plasma-protein metabolism, not elsewhere classified: Secondary | ICD-10-CM | POA: Diagnosis present

## 2020-02-11 DIAGNOSIS — Z7401 Bed confinement status: Secondary | ICD-10-CM | POA: Diagnosis not present

## 2020-02-11 DIAGNOSIS — D509 Iron deficiency anemia, unspecified: Secondary | ICD-10-CM | POA: Diagnosis present

## 2020-02-11 DIAGNOSIS — E785 Hyperlipidemia, unspecified: Secondary | ICD-10-CM | POA: Diagnosis present

## 2020-02-11 DIAGNOSIS — H5461 Unqualified visual loss, right eye, normal vision left eye: Secondary | ICD-10-CM | POA: Diagnosis present

## 2020-02-11 DIAGNOSIS — E871 Hypo-osmolality and hyponatremia: Secondary | ICD-10-CM | POA: Diagnosis not present

## 2020-02-11 DIAGNOSIS — D649 Anemia, unspecified: Secondary | ICD-10-CM | POA: Diagnosis present

## 2020-02-11 DIAGNOSIS — Z9842 Cataract extraction status, left eye: Secondary | ICD-10-CM

## 2020-02-11 DIAGNOSIS — K5900 Constipation, unspecified: Secondary | ICD-10-CM | POA: Diagnosis not present

## 2020-02-11 DIAGNOSIS — Z87891 Personal history of nicotine dependence: Secondary | ICD-10-CM

## 2020-02-11 DIAGNOSIS — N189 Chronic kidney disease, unspecified: Secondary | ICD-10-CM | POA: Diagnosis present

## 2020-02-11 DIAGNOSIS — E46 Unspecified protein-calorie malnutrition: Secondary | ICD-10-CM | POA: Diagnosis present

## 2020-02-11 DIAGNOSIS — N179 Acute kidney failure, unspecified: Secondary | ICD-10-CM | POA: Diagnosis present

## 2020-02-11 DIAGNOSIS — T508X5A Adverse effect of diagnostic agents, initial encounter: Secondary | ICD-10-CM | POA: Diagnosis present

## 2020-02-11 DIAGNOSIS — Z79899 Other long term (current) drug therapy: Secondary | ICD-10-CM

## 2020-02-11 DIAGNOSIS — J189 Pneumonia, unspecified organism: Secondary | ICD-10-CM | POA: Diagnosis present

## 2020-02-11 DIAGNOSIS — E8779 Other fluid overload: Secondary | ICD-10-CM | POA: Diagnosis present

## 2020-02-11 DIAGNOSIS — R627 Adult failure to thrive: Secondary | ICD-10-CM | POA: Diagnosis present

## 2020-02-11 DIAGNOSIS — D638 Anemia in other chronic diseases classified elsewhere: Secondary | ICD-10-CM | POA: Diagnosis present

## 2020-02-11 DIAGNOSIS — R531 Weakness: Secondary | ICD-10-CM

## 2020-02-11 DIAGNOSIS — I129 Hypertensive chronic kidney disease with stage 1 through stage 4 chronic kidney disease, or unspecified chronic kidney disease: Secondary | ICD-10-CM | POA: Diagnosis not present

## 2020-02-11 DIAGNOSIS — Z86010 Personal history of colonic polyps: Secondary | ICD-10-CM

## 2020-02-11 DIAGNOSIS — D72829 Elevated white blood cell count, unspecified: Secondary | ICD-10-CM | POA: Diagnosis present

## 2020-02-11 DIAGNOSIS — R0682 Tachypnea, not elsewhere classified: Secondary | ICD-10-CM | POA: Diagnosis present

## 2020-02-11 DIAGNOSIS — C801 Malignant (primary) neoplasm, unspecified: Secondary | ICD-10-CM | POA: Diagnosis not present

## 2020-02-11 DIAGNOSIS — R5381 Other malaise: Secondary | ICD-10-CM | POA: Diagnosis not present

## 2020-02-11 DIAGNOSIS — R3129 Other microscopic hematuria: Secondary | ICD-10-CM | POA: Diagnosis present

## 2020-02-11 DIAGNOSIS — M2550 Pain in unspecified joint: Secondary | ICD-10-CM | POA: Diagnosis not present

## 2020-02-11 DIAGNOSIS — R112 Nausea with vomiting, unspecified: Secondary | ICD-10-CM | POA: Diagnosis not present

## 2020-02-11 DIAGNOSIS — Z7982 Long term (current) use of aspirin: Secondary | ICD-10-CM

## 2020-02-11 DIAGNOSIS — M353 Polymyalgia rheumatica: Secondary | ICD-10-CM | POA: Diagnosis present

## 2020-02-11 DIAGNOSIS — R29818 Other symptoms and signs involving the nervous system: Secondary | ICD-10-CM | POA: Diagnosis not present

## 2020-02-11 DIAGNOSIS — K5730 Diverticulosis of large intestine without perforation or abscess without bleeding: Secondary | ICD-10-CM | POA: Diagnosis present

## 2020-02-11 DIAGNOSIS — Z66 Do not resuscitate: Secondary | ICD-10-CM | POA: Diagnosis present

## 2020-02-11 DIAGNOSIS — R29810 Facial weakness: Secondary | ICD-10-CM | POA: Diagnosis present

## 2020-02-11 DIAGNOSIS — R1110 Vomiting, unspecified: Secondary | ICD-10-CM | POA: Diagnosis not present

## 2020-02-11 DIAGNOSIS — H9193 Unspecified hearing loss, bilateral: Secondary | ICD-10-CM | POA: Diagnosis present

## 2020-02-11 LAB — URINALYSIS, ROUTINE W REFLEX MICROSCOPIC
Bacteria, UA: NONE SEEN
Bilirubin Urine: NEGATIVE
Glucose, UA: NEGATIVE mg/dL
Hgb urine dipstick: NEGATIVE
Ketones, ur: NEGATIVE mg/dL
Leukocytes,Ua: NEGATIVE
Nitrite: NEGATIVE
Protein, ur: 30 mg/dL — AB
Specific Gravity, Urine: 1.019 (ref 1.005–1.030)
pH: 7 (ref 5.0–8.0)

## 2020-02-11 LAB — COMPREHENSIVE METABOLIC PANEL
ALT: 24 U/L (ref 0–44)
AST: 25 U/L (ref 15–41)
Albumin: 1.4 g/dL — ABNORMAL LOW (ref 3.5–5.0)
Alkaline Phosphatase: 190 U/L — ABNORMAL HIGH (ref 38–126)
Anion gap: 8 (ref 5–15)
BUN: 20 mg/dL (ref 8–23)
CO2: 26 mmol/L (ref 22–32)
Calcium: 7.9 mg/dL — ABNORMAL LOW (ref 8.9–10.3)
Chloride: 95 mmol/L — ABNORMAL LOW (ref 98–111)
Creatinine, Ser: 1.23 mg/dL (ref 0.61–1.24)
GFR calc Af Amer: >60 mL/min (ref >60)
GFR calc non Af Amer: 54 mL/min — ABNORMAL LOW (ref >60)
Glucose, Bld: 108 mg/dL — ABNORMAL HIGH (ref 70–99)
Potassium: 4.2 mmol/L (ref 3.5–5.1)
Sodium: 129 mmol/L — ABNORMAL LOW (ref 135–145)
Total Bilirubin: 0.6 mg/dL (ref 0.3–1.2)
Total Protein: 6.6 g/dL (ref 6.5–8.1)

## 2020-02-11 LAB — CBC WITH DIFFERENTIAL/PLATELET
Abs Immature Granulocytes: 0.21 10*3/uL — ABNORMAL HIGH (ref 0.00–0.07)
Basophils Absolute: 0.1 10*3/uL (ref 0.0–0.1)
Basophils Relative: 0 %
Eosinophils Absolute: 0.4 10*3/uL (ref 0.0–0.5)
Eosinophils Relative: 2 %
HCT: 26.6 % — ABNORMAL LOW (ref 39.0–52.0)
Hemoglobin: 8 g/dL — ABNORMAL LOW (ref 13.0–17.0)
Immature Granulocytes: 1 %
Lymphocytes Relative: 13 %
Lymphs Abs: 2.4 10*3/uL (ref 0.7–4.0)
MCH: 26.6 pg (ref 26.0–34.0)
MCHC: 30.1 g/dL (ref 30.0–36.0)
MCV: 88.4 fL (ref 80.0–100.0)
Monocytes Absolute: 1.2 10*3/uL — ABNORMAL HIGH (ref 0.1–1.0)
Monocytes Relative: 6 %
Neutro Abs: 14.3 10*3/uL — ABNORMAL HIGH (ref 1.7–7.7)
Neutrophils Relative %: 78 %
Platelets: 421 10*3/uL — ABNORMAL HIGH (ref 150–400)
RBC: 3.01 MIL/uL — ABNORMAL LOW (ref 4.22–5.81)
RDW: 15.2 % (ref 11.5–15.5)
WBC: 18.5 10*3/uL — ABNORMAL HIGH (ref 4.0–10.5)
nRBC: 0 % (ref 0.0–0.2)

## 2020-02-11 LAB — RETICULOCYTES
Immature Retic Fract: 27.3 % — ABNORMAL HIGH (ref 2.3–15.9)
RBC.: 3.16 MIL/uL — ABNORMAL LOW (ref 4.22–5.81)
Retic Count, Absolute: 67.6 10*3/uL (ref 19.0–186.0)
Retic Ct Pct: 2.1 % (ref 0.4–3.1)

## 2020-02-11 LAB — RESPIRATORY PANEL BY RT PCR (FLU A&B, COVID)
Influenza A by PCR: NEGATIVE
Influenza B by PCR: NEGATIVE
SARS Coronavirus 2 by RT PCR: NEGATIVE

## 2020-02-11 LAB — BRAIN NATRIURETIC PEPTIDE: B Natriuretic Peptide: 191.8 pg/mL — ABNORMAL HIGH (ref 0.0–100.0)

## 2020-02-11 LAB — FOLATE: Folate: 14.2 ng/mL (ref >5.9)

## 2020-02-11 MED ORDER — ENOXAPARIN SODIUM 40 MG/0.4ML ~~LOC~~ SOLN
40.0000 mg | SUBCUTANEOUS | Status: DC
  Administered 2020-02-12 – 2020-02-19 (×8): 40 mg via SUBCUTANEOUS
  Filled 2020-02-11 (×8): qty 0.4

## 2020-02-11 MED ORDER — BISACODYL 5 MG PO TBEC: 5.0000 mg | DELAYED_RELEASE_TABLET | Freq: Every day | ORAL | Status: DC | PRN

## 2020-02-11 MED ORDER — HYDROCODONE-ACETAMINOPHEN 5-325 MG PO TABS: 1.0000 | ORAL_TABLET | ORAL | Status: DC | PRN

## 2020-02-11 MED ORDER — DOCUSATE SODIUM 100 MG PO CAPS
100.0000 mg | ORAL_CAPSULE | Freq: Two times a day (BID) | ORAL | Status: DC
  Administered 2020-02-11 – 2020-02-17 (×13): 100 mg via ORAL
  Filled 2020-02-11 (×14): qty 1

## 2020-02-11 MED ORDER — IOHEXOL 300 MG/ML  SOLN
100.0000 mL | Freq: Once | INTRAMUSCULAR | Status: AC | PRN
  Administered 2020-02-11: 100 mL via INTRAVENOUS

## 2020-02-11 MED ORDER — LORAZEPAM 2 MG/ML IJ SOLN
1.0000 mg | Freq: Once | INTRAMUSCULAR | Status: AC
  Administered 2020-02-11: 1 mg via INTRAVENOUS
  Filled 2020-02-11: qty 1

## 2020-02-11 MED ORDER — ONDANSETRON HCL 4 MG/2ML IJ SOLN
4.0000 mg | Freq: Four times a day (QID) | INTRAMUSCULAR | Status: DC | PRN
  Administered 2020-02-15 – 2020-02-18 (×2): 4 mg via INTRAVENOUS
  Filled 2020-02-11 (×3): qty 2

## 2020-02-11 MED ORDER — MORPHINE SULFATE (PF) 2 MG/ML IV SOLN: 2.0000 mg | INTRAVENOUS | Status: DC | PRN

## 2020-02-11 MED ORDER — ATORVASTATIN CALCIUM 10 MG PO TABS
20.0000 mg | ORAL_TABLET | Freq: Every day | ORAL | Status: DC
  Administered 2020-02-12 – 2020-02-19 (×8): 20 mg via ORAL
  Filled 2020-02-11 (×8): qty 2

## 2020-02-11 MED ORDER — ONDANSETRON HCL 4 MG PO TABS: 4.0000 mg | ORAL_TABLET | Freq: Four times a day (QID) | ORAL | Status: DC | PRN

## 2020-02-11 MED ORDER — FOLIC ACID 1 MG PO TABS
1.0000 mg | ORAL_TABLET | Freq: Every day | ORAL | Status: DC
  Administered 2020-02-12 – 2020-02-19 (×8): 1 mg via ORAL
  Filled 2020-02-11 (×8): qty 1

## 2020-02-11 MED ORDER — FERROUS SULFATE 325 (65 FE) MG PO TABS
325.0000 mg | ORAL_TABLET | Freq: Two times a day (BID) | ORAL | Status: DC
  Administered 2020-02-12 – 2020-02-19 (×16): 325 mg via ORAL
  Filled 2020-02-11 (×16): qty 1

## 2020-02-11 MED ORDER — ZOLPIDEM TARTRATE 5 MG PO TABS
5.0000 mg | ORAL_TABLET | Freq: Every evening | ORAL | Status: DC | PRN
  Administered 2020-02-11: 5 mg via ORAL
  Filled 2020-02-11: qty 1

## 2020-02-11 MED ORDER — HYDRALAZINE HCL 20 MG/ML IJ SOLN: 5.0000 mg | INTRAMUSCULAR | Status: DC | PRN

## 2020-02-11 MED ORDER — METOPROLOL TARTRATE 5 MG/5ML IV SOLN
2.5000 mg | INTRAVENOUS | Status: AC | PRN
  Administered 2020-02-11: 2.5 mg via INTRAVENOUS
  Filled 2020-02-11: qty 5

## 2020-02-11 MED ORDER — ACETAMINOPHEN 650 MG RE SUPP: 650.0000 mg | Freq: Four times a day (QID) | RECTAL | Status: DC | PRN

## 2020-02-11 MED ORDER — ACETAMINOPHEN 325 MG PO TABS: 650.0000 mg | ORAL_TABLET | Freq: Four times a day (QID) | ORAL | Status: DC | PRN

## 2020-02-11 MED ORDER — POLYETHYLENE GLYCOL 3350 17 G PO PACK
17.0000 g | PACK | Freq: Every day | ORAL | Status: DC | PRN
  Administered 2020-02-19: 17 g via ORAL
  Filled 2020-02-11: qty 1

## 2020-02-11 NOTE — ED Notes (Signed)
Dinner Tray Ordered @ 1800. 

## 2020-02-11 NOTE — ED Notes (Signed)
Patient given bag meal with drink. 

## 2020-02-11 NOTE — ED Notes (Signed)
Pt transported to MRI 

## 2020-02-11 NOTE — ED Notes (Signed)
Increased weakness and poor intake after discharge from hospitall per pt.  States unsure of dates and diagnosis.  Denies any dyspnea at rest. Very HOH and confused at times.  Will bring wife from [place].  Pt has no acute distress.

## 2020-02-11 NOTE — ED Notes (Signed)
RN unable to obtain labs. 

## 2020-02-11 NOTE — H&P (Addendum)
History and Physical    JOSHIAH TRAYNHAM TKZ:[id number] DOB: November 29, 1934 DOA: 02/11/2020  PCP: [facility] [employer], MD Consultants:  Laural Golden - GI - has an appt Wednesday Patient coming from:  Home - lives with wife; NOK:  Wife, Ousmane Seeman, [phone]; Son-in-law is MD, Amaryllis Dyke, [phone] (ok with text, will be in clinic this week and heading out of the country on Friday)  Chief Complaint: Weakness  HPI: Jeffrey Ho is a 85 y.o. male with medical history significant of R eye blindness; carotid artery occlusion; HTN; and HLD.  He was admitted from 3/28-30 for CAP with demand ischemia; he was discharged on Augmentin.  During that admission, he had heme positive stools but had stable Hgb and no overt bleeding and so was planned for outpatient GI f/u.  He returned from 4/20-23 with symptomatic anemia and hyponatremia.  EGD/colonoscopy performed with reflux erosive esophagitis and diverticulosis/polyps.  He returned today with c/o weakness.  He was discharged on 4/2 and his energy is getting worse.  Just moving to the bathroom is exhausting.  He walks short distances and is wiped out.  He stays nauseated.  He is SOB now just from using the bedside commode.  He eats small amounts of food.  He felt better after hospital d/c and worse since.  They discussed giving him blood but did not during prior hospitalization.  No fever, occasional chills.  No dysphagia.  No chest pain.  No cough.  No abdominal pain.  +early satiety.  He started with constipation since this started, difficult.  No urinary symptoms.  His SIL (MD) reports that he is very vibrant, walks several miles a day and works on heavy equipment.  Has been slowing down since maybe Christmas.    ED Course:  Treated for PNA and then weakness/anemia.  Hgb 10 in March, 8-9 and not transfused.  EGD/colonoscopy performed with frank bleeding.  +leukocytosis, no source.  Negative blood cultures.   Chest CT improved.  Difficulty walking to the  bathroom, DOE.  CXR ok, no hypoxia.  BNP mildly elevated without pulmonary edema.  Hgb 8.  ?sx anemia.  Maybe give blood and SNF rehab?  Abdominal CT unremarkable.  Review of Systems: As per HPI; otherwise review of systems reviewed and negative.   Ambulatory Status:  Ambulated without assistance until after last hospitalization, has to ride in a wheelchair to doctor's office and now must use cane to go bed to bath and back  COVID Vaccine Status:   Complete  Past Medical History:  Diagnosis Date  . Blindness of right eye   . Carotid artery occlusion   . HOH (hard of hearing)   . Hyperlipidemia   . Hypertension     Past Surgical History:  Procedure Laterality Date  . BIOPSY  01/26/2020   Procedure: BIOPSY;  Surgeon: Daneil Dolin, MD;  [facility];  Service: Endoscopy;;  gastric biopsies for H. Pylori  . CAROTID ENDARTERECTOMY     left CEA  06/24.2010  . CAROTID ENDARTERECTOMY     right CEA  02/20/2009  . COLONOSCOPY WITH PROPOFOL N/A 01/26/2020   Procedure: COLONOSCOPY WITH PROPOFOL;  Surgeon: Daneil Dolin, MD;  [facility];  Service: Endoscopy;  Laterality: N/A;  . ESOPHAGOGASTRODUODENOSCOPY (EGD) WITH PROPOFOL N/A 01/26/2020   Procedure: ESOPHAGOGASTRODUODENOSCOPY (EGD) WITH PROPOFOL;  Surgeon: Daneil Dolin, MD;  [facility];  Service: Endoscopy;  Laterality: N/A;  . EYE SURGERY Left Nov. 2015   Cataract  .  POLYPECTOMY  01/26/2020   Procedure: POLYPECTOMY;  Surgeon: Daneil Dolin, MD;  [facility];  Service: Endoscopy;;  Splenic Flexure polyp     Social History   Socioeconomic History  . Marital status: Married    Spouse name: Not on file  . Number of children: Not on file  . Years of education: Not on file  . Highest education level: Not on file  Occupational History  . Not on file  Tobacco Use  . Smoking status: Former Smoker    Quit date: 08/10/1969    Years since quitting: 50.5  . Smokeless tobacco: Never Used   Substance and Sexual Activity  . Alcohol use: No  . Drug use: No  . Sexual activity: Not on file  Other Topics Concern  . Not on file  Social History Narrative  . Not on file   Social Determinants of Health   Financial Resource Strain:   . Difficulty of Paying Living Expenses:   Food Insecurity:   . Worried About Charity fundraiser in the Last Year:   . Arboriculturist in the Last Year:   Transportation Needs:   . Film/video editor (Medical):   Marland Kitchen Lack of Transportation (Non-Medical):   Physical Activity:   . Days of Exercise per Week:   . Minutes of Exercise per Session:   Stress:   . Feeling of Stress :   Social Connections:   . Frequency of Communication with Friends and Family:   . Frequency of Social Gatherings with Friends and Family:   . Attends Religious Services:   . Active Member of Clubs or Organizations:   . Attends Archivist Meetings:   Marland Kitchen Marital Status:   Intimate Partner Violence:   . Fear of Current or Ex-Partner:   . Emotionally Abused:   Marland Kitchen Physically Abused:   . Sexually Abused:     No Known Allergies  Family History  Problem Relation Age of Onset  . Heart disease Father        Before age 7    Prior to Admission medications   Medication Sig Start Date End Date Taking? Authorizing Provider  aspirin 81 MG tablet Take 81 mg by mouth daily.   Yes [provider]  atorvastatin (LIPITOR) 20 MG tablet 1 tablet daily. 05/07/16  Yes [provider]  docusate sodium (COLACE) 100 MG capsule Take 1 capsule (100 mg total) by mouth 2 (two) times daily. 01/02/20 01/01/21 Yes Shah, Pratik D, DO  ferrous sulfate 325 (65 FE) MG tablet Take 1 tablet (325 mg total) by mouth daily. Patient taking differently: Take 325 mg by mouth 2 (two) times daily with a meal.  01/02/20 01/01/21 Yes Shah, Pratik D, DO  fish oil-omega-3 fatty acids 1000 MG capsule Take 1 g by mouth daily.    Yes [provider]  folic acid (FOLVITE) 1 MG  tablet Take 1 mg by mouth daily. 01/23/20  Yes [provider]  metoprolol tartrate (LOPRESSOR) 25 MG tablet Take 0.5 tablets (12.5 mg total) by mouth 2 (two) times daily. 01/26/20 02/25/20 Yes Shah, Pratik D, DO  niacin 500 MG tablet Take 500 mg by mouth daily with breakfast.   Yes [provider]  ondansetron (ZOFRAN) 4 MG tablet Take 4 mg by mouth every 8 (eight) hours as needed for nausea/vomiting. 01/27/20  Yes [provider]  benzonatate (TESSALON) 100 MG capsule Take 100 mg by mouth 4 (four) times daily as needed  for cough.  12/07/19   [provider]  clotrimazole-betamethasone (LOTRISONE) cream Apply 1 application topically See admin instructions. Apply cream to the affected and surround area(s) of skin twice daily in the morning and evening. 01/23/20   [provider]    Physical Exam: Vitals:   02/11/20 1450 02/11/20 1555 02/11/20 1637 02/11/20 1700  BP:   (!) 143/76 (!) 122/95  Pulse: 94 93 95 93  Resp: (!) 28 (!) 38 (!) 41 (!) 33  Temp:      TempSrc:      SpO2: 98% 100% 100% 100%  Weight:      Height:         . General:  Appears calm and comfortable and is NAD; persistent tachypnea at rest . Eyes:  PERRL, EOMI, normal lids, iris . ENT:  Extremely hard of hearing, grossly normal lips & tongue, mmm . Neck:  no LAD, masses or thyromegaly . Cardiovascular:  RRR, no m/r/g. No LE edema.  Marland Kitchen Respiratory:   CTA bilaterally with no wheezes/rales/rhonchi.  Normal respiratory effort. . Abdomen:  soft, NT, ND, NABS . Skin:  no rash or induration seen on limited exam . Musculoskeletal:  grossly normal tone BUE/BLE with subtle decrease in strength of LLE vs. RLE, good ROM, no bony abnormality . Psychiatric:  grossly normal mood and affect, speech fluent and appropriate, AOx3, anxious/frustrated about medical situation . Neurologic:  ?subtle L facial droop, moves all extremities in coordinated fashion with subtle decreased strength in LLE compared  to RLE (4/5 vs. 4-5/5), sensation intact    Radiological Exams on Admission: DG Chest 2 View  Result Date: 02/11/2020 CLINICAL DATA:  Shortness of breath EXAM: CHEST - 2 VIEW COMPARISON:  January 23, 2020 FINDINGS: There is either scarring in the lateral right base or small right pleural effusion. There is apparent scarring in the right mid lung as well. Lungs elsewhere are clear. Heart size and pulmonary vascularity are normal. There is aortic atherosclerosis. No adenopathy. There is degenerative change in the thoracic spine. IMPRESSION: Scarring right mid lung. Question scarring lateral right base versus small right pleural effusion. Lungs elsewhere clear. Stable cardiac silhouette.  Aortic Atherosclerosis (ICD10-I70.0). Electronically Signed   By: Lowella Grip III M.D.   On: 02/11/2020 11:40   CT Abdomen Pelvis W Contrast  Result Date: 02/11/2020 CLINICAL DATA:  Nausea and vomiting. Weakness. Recent pneumonia EXAM: CT ABDOMEN AND PELVIS WITH CONTRAST TECHNIQUE: Multidetector CT imaging of the abdomen and pelvis was performed using the standard protocol following bolus administration of intravenous contrast. CONTRAST:  18m OMNIPAQUE IOHEXOL 300 MG/ML  SOLN COMPARISON:  None. FINDINGS: Lower Chest: No acute findings. Tiny pleural effusions noted bilaterally. Hepatobiliary: No hepatic masses identified. A few tiny sub-cm calcified gallstones are seen, however there is no evidence of cholecystitis or biliary dilatation. Pancreas:  No mass or inflammatory changes. Spleen: Within normal limits in size and appearance. Adrenals/Urinary Tract: No masses identified. No evidence of ureteral calculi or hydronephrosis. Stomach/Bowel: No evidence of obstruction, inflammatory process or abnormal fluid collections. Normal appendix visualized. Diverticulosis is seen mainly involving the sigmoid colon, however there is no evidence of diverticulitis. Vascular/Lymphatic: No pathologically enlarged lymph nodes. No  abdominal aortic aneurysm. Aortic atherosclerosis noted. Reproductive:  No mass or other significant abnormality. Other:  None. Musculoskeletal:  No suspicious bone lesions identified. IMPRESSION: 1. Colonic diverticulosis, without radiographic evidence of diverticulitis or other acute findings. 2. Cholelithiasis. No radiographic evidence of cholecystitis. 3. Tiny bilateral pleural effusions. Aortic Atherosclerosis (ICD10-I70.0). Electronically  Signed   By: Marlaine Hind M.D.   On: 02/11/2020 14:08    EKG: Independently reviewed.  NSR with rate 99;  no evidence of acute ischemia   Labs on Admission: I have personally reviewed the available labs and imaging studies at the time of the admission.  Pertinent labs:   Na++ 129; 129 on 4/23 Glucose 108 AP 190 Albumin 1.4; 1.6 on 4/23 BNP 191.8 WBC 18.5; 19.9 on 4/23 and persistently elevated since 3/28 Hgb 8.0, normocytic; 8.9 on 4/22; baseline 9-10 Platelets 421 UA: 30 protein  Assessment/Plan Principal Problem:   Failure to thrive in adult Active Problems:   HTN (hypertension)   Hyperlipidemia   Symptomatic anemia   Hyponatremia   Hypoalbuminemia due to protein-calorie malnutrition (HCC)   Leukocytosis   Tachypnea   Physical deconditioning    Failure to thrive -Patient was previously hospitalized twice since March -Progressive physical decline - possibly attributed to recurrent hospitalizations, decreased appetite with early satiety, and deconditioning -However, there are a number of medical issues which merit consideration -Will admit based on persistent tachypnea and compilation of historical and exam findings  Symptomatic anemia with leukocytosis -Patient with prior recent admission for symptomatic anemia but he was not transfused due to hemodynamic stability; he did have heme positive stools -During that admission, he had EGD (4/23 with mild erosive reflux esophagitis, not explanative of anemia) and colonoscopy (also 4/23 with  diverticula and polyps) -His anemia has worsened slightly, but not enough to make this appear to be GI bleeding as a clear cause -Hgb 8.0 -MCV and RDW are normal -Iron panel on 3/29 with low iron and markedly elevated ferritin -Son-in-law reports markedly elevated ferritin of maybe 3200 recently -This normocytic anemia appears to be anemia of chronic disease -Will recheck anemic panel -In conjunction with persistent leukocytosis, this is concerning for a blood dyscrasia -Transfuse 1 unit PRBC to start and recheck Hgb afterwards.  -Patient counseled about short- and long-term risks associated with transfusion and consents to receive blood products. -Peripheral smear ordered -Will check blood cultures but unlikely infectious etiology -Oncology consult requested for consideration of bone marrow biopsy -Hold ASA for now  Tachypnea -Persistent tachypnea in 30-40 range throughout evaluation -His wife reports that this is "normal" for him -RR on day of d/c prior was up to 29  -CXR today unremarkable -CT A/P unremarkable other than tiny B pleural effusions -CT chest on 4/20 with improving RLL PNA -Will monitor after giving blood products to see if this improves respiratory effort  Deconditioning -With L facial droop and LLE weakness, will check MRI to r/o CVA - although this was not reported by patient/family so perhaps is an overcall -Lopressor can cause fatigue, possibly contributing as it appears to have been started on 4/23; will hold -PT/OT/ST consults -Albumin 1.4, poor PO intake, nutrition consult -If placement is needed (seems likely, and already has his qualifying inpatient stay), prefers [facility]  Hyponatremia -Appears to be stable from prior -Unlikely to be contributing to current presentation -Will follow  Carotid occlusion -s/p B CEA -No evidence of restenosis on dopplers in 04/2018 -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  HTN -Appears to have been recently  started on Lopressor -This may be contributing to fatigue -Will hold -Will cover with IV prn hydralazine  HLD -Continue Lipitor    Note: This patient has been tested and is pending for the novel coronavirus COVID-19.  DVT prophylaxis:  Lovenox Code Status:  DNR - confirmed with patient/family Family  Communication:  Wife was present throughout evaluation; I also spoke with the patient's son-in-law (MD) by telephone at the time of admission. Disposition Plan:  The patient is from: home  Anticipated d/c is to: SNF rehab.  Anticipated d/c date will depend on clinical response to treatment, likely several days  Patient is currently: acutely ill Consults called: Oncology (message sent to the navigator via [place]) Admission status:  Admit - It is my clinical opinion that admission to INPATIENT is reasonable and necessary because of the expectation that this patient will require hospital care that crosses at least 2 midnights to treat this condition based on the medical complexity of the problems presented.  Given the aforementioned information, the predictability of an adverse outcome is felt to be significant.    Karmen Bongo MD [facility]   How to contact the [facility] Attending or Consulting provider [place] or covering provider during after hours [place], for this patient?  1. Check the care team in [facility] and look for a) attending/consulting [facility] provider listed and b) the [facility] team listed 2. Log into [email or web address] and use [place]'s universal password to access. If you do not have the password, please contact the hospital operator. 3. Locate the [facility] provider you are looking for under [facility] and page to a number that you can be directly reached. 4. If you still have difficulty reaching the provider, please page the [facility] (Director on Call) for the Hospitalists listed on amion for assistance.   02/11/2020, 6:05 PM

## 2020-02-11 NOTE — ED Provider Notes (Signed)
[facility] EMERGENCY DEPARTMENT Provider Note   CSN: [id number] Arrival date & time: 02/11/20  1013     History Chief Complaint  Patient presents with  . Weakness    Jeffrey Ho is a 85 y.o. male.  Patient is a 85 year old male who presents with progressive weakness.  He has a history of right eye blindness, hyperlipidemia, hypertension, hearing loss.  He was admitted from March 28 to March 30 for community-acquired pneumonia.  He has become progressively weak since that time.  He was readmitted on April 20 to April 23 for symptomatic anemia associated with hyponatremia.  He did not require blood transfusion.  He did get a EGD and a colonoscopy which showed some reflux erosive esophagitis and some colon polyps.  His hemoglobin remained stable and his hyponatremia improved slightly with normal saline.  He was discharged home.  His hydrochlorothiazide was discontinued and his Lopressor was changed from 25 mg twice a day to 12.5 mg twice a day.  He is also had a persistent leukocytosis.  He had a repeat CT scan of his chest on April 20 which showed an improving right upper lobe pneumonia.  He is not been on steroids.  He has had progressive decrease in his appetite and progressive generalized weakness.  He says he just does not have any energy.  He has had some persistent nausea but no vomiting.  No fevers.  Occasional coughing but nothing that is more persistent.  He denies any abdominal pain.  He has had some increased swelling in his lower extremities.  He feels short of breath with minimal exertion.  No associated chest pain.        Past Medical History:  Diagnosis Date  . Blindness of right eye   . Carotid artery occlusion   . HOH (hard of hearing)   . Hyperlipidemia   . Hypertension     Patient Active Problem List   Diagnosis Date Noted  . Weakness 01/24/2020  . Symptomatic anemia 01/23/2020  . Prolonged QT interval 01/23/2020  . Hyponatremia 01/23/2020    . Hypoalbuminemia due to protein-calorie malnutrition (HCC) 01/23/2020  . Leukocytosis 01/23/2020  . HTN (hypertension) 12/31/2019  . Carotid artery disease without cerebral infarction (HCC) 12/31/2019  . Cardiac ischemia 12/31/2019  . CAP (community acquired pneumonia) 12/31/2019  . Hyperlipidemia 12/31/2019  . Heme positive stool 12/31/2019  . Occlusion and stenosis of carotid artery without mention of cerebral infarction 08/22/2012    Past Surgical History:  Procedure Laterality Date  . BIOPSY  01/26/2020   Procedure: BIOPSY;  Surgeon: Corbin Ade, MD;  Location: [facility];  Service: Endoscopy;;  gastric biopsies for H. Pylori  . CAROTID ENDARTERECTOMY     left CEA  06/24.2010  . CAROTID ENDARTERECTOMY     right CEA  02/20/2009  . COLONOSCOPY WITH PROPOFOL N/A 01/26/2020   Procedure: COLONOSCOPY WITH PROPOFOL;  Surgeon: Corbin Ade, MD;  Location: [facility];  Service: Endoscopy;  Laterality: N/A;  . ESOPHAGOGASTRODUODENOSCOPY (EGD) WITH PROPOFOL N/A 01/26/2020   Procedure: ESOPHAGOGASTRODUODENOSCOPY (EGD) WITH PROPOFOL;  Surgeon: Corbin Ade, MD;  Location: [facility];  Service: Endoscopy;  Laterality: N/A;  . EYE SURGERY Left Nov. 2015   Cataract  . POLYPECTOMY  01/26/2020   Procedure: POLYPECTOMY;  Surgeon: Corbin Ade, MD;  Location: [facility];  Service: Endoscopy;;  Splenic Flexure polyp        Family History  Problem Relation Age of Onset  .  Heart disease Father        Before age 48    Social History   Tobacco Use  . Smoking status: Former Smoker    Quit date: 08/10/1969    Years since quitting: 50.5  . Smokeless tobacco: Never Used  Substance Use Topics  . Alcohol use: No  . Drug use: No    Home Medications Prior to Admission medications   Medication Sig Start Date End Date Taking? Authorizing Provider  aspirin 81 MG tablet Take 81 mg by mouth daily.   Yes [provider]  atorvastatin (LIPITOR) 20 MG tablet 1 tablet  daily. 05/07/16  Yes [provider]  docusate sodium (COLACE) 100 MG capsule Take 1 capsule (100 mg total) by mouth 2 (two) times daily. 01/02/20 01/01/21 Yes Shah, Pratik D, DO  ferrous sulfate 325 (65 FE) MG tablet Take 1 tablet (325 mg total) by mouth daily. Patient taking differently: Take 325 mg by mouth 2 (two) times daily with a meal.  01/02/20 01/01/21 Yes Shah, Pratik D, DO  fish oil-omega-3 fatty acids 1000 MG capsule Take 1 g by mouth daily.    Yes [provider]  folic acid (FOLVITE) 1 MG tablet Take 1 mg by mouth daily. 01/23/20  Yes [provider]  metoprolol tartrate (LOPRESSOR) 25 MG tablet Take 0.5 tablets (12.5 mg total) by mouth 2 (two) times daily. 01/26/20 02/25/20 Yes Shah, Pratik D, DO  niacin 500 MG tablet Take 500 mg by mouth daily with breakfast.   Yes [provider]  ondansetron (ZOFRAN) 4 MG tablet Take 4 mg by mouth every 8 (eight) hours as needed for nausea/vomiting. 01/27/20  Yes [provider]  benzonatate (TESSALON) 100 MG capsule Take 100 mg by mouth 4 (four) times daily as needed for cough.  12/07/19   [provider]  clotrimazole-betamethasone (LOTRISONE) cream Apply 1 application topically See admin instructions. Apply cream to the affected and surround area(s) of skin twice daily in the morning and evening. 01/23/20   [provider]    Allergies    Patient has no known allergies.  Review of Systems   Review of Systems  Constitutional: Positive for fatigue. Negative for chills, diaphoresis and fever.  HENT: Negative for congestion, rhinorrhea and sneezing.   Eyes: Negative.   Respiratory: Positive for cough and shortness of breath. Negative for chest tightness.   Cardiovascular: Negative for chest pain and leg swelling.  Gastrointestinal: Positive for nausea. Negative for abdominal pain, blood in stool, diarrhea and vomiting.  Genitourinary: Negative for difficulty urinating, flank pain, frequency  and hematuria.  Musculoskeletal: Negative for arthralgias and back pain.  Skin: Negative for rash.  Neurological: Negative for dizziness, speech difficulty, weakness, numbness and headaches.    Physical Exam Updated Vital Signs BP 130/62   Pulse 81   Temp 97.8 F (36.6 C) (Oral)   Resp (!) 24   Ht 6' (1.829 m)   Wt 97.5 kg   SpO2 99%   BMI 29.15 kg/m   Physical Exam Constitutional:      Appearance: He is well-developed.  HENT:     Head: Normocephalic and atraumatic.  Eyes:     Pupils: Pupils are equal, round, and reactive to light.  Cardiovascular:     Rate and Rhythm: Normal rate and regular rhythm.     Heart sounds: Normal heart sounds.  Pulmonary:     Effort: Pulmonary effort is normal. No respiratory distress.     Breath sounds: Normal breath  sounds. No wheezing or rales.     Comments: Tachypnea Chest:     Chest wall: No tenderness.  Abdominal:     General: Bowel sounds are normal.     Palpations: Abdomen is soft.     Tenderness: There is no abdominal tenderness. There is no guarding or rebound.  Musculoskeletal:        General: Normal range of motion.     Cervical back: Normal range of motion and neck supple.     Right lower leg: Edema present.     Left lower leg: Edema present.     Comments: 2-3+ pitting edema of his bilateral lower extremities.  No associated warmth or erythema.  Lymphadenopathy:     Cervical: No cervical adenopathy.  Skin:    General: Skin is warm and dry.     Findings: No rash.  Neurological:     Mental Status: He is alert and oriented to person, place, and time.     ED Results / Procedures / Treatments   Labs (all labs ordered are listed, but only abnormal results are displayed) Labs Reviewed  COMPREHENSIVE METABOLIC PANEL - Abnormal; Notable for the following components:      Result Value   Sodium 129 (*)    Chloride 95 (*)    Glucose, Bld 108 (*)    Calcium 7.9 (*)    Albumin 1.4 (*)    Alkaline Phosphatase 190 (*)    GFR  calc non Af Amer 54 (*)    All other components within normal limits  CBC WITH DIFFERENTIAL/PLATELET - Abnormal; Notable for the following components:   WBC 18.5 (*)    RBC 3.01 (*)    Hemoglobin 8.0 (*)    HCT 26.6 (*)    Platelets 421 (*)    Neutro Abs 14.3 (*)    Monocytes Absolute 1.2 (*)    Abs Immature Granulocytes 0.21 (*)    All other components within normal limits  URINALYSIS, ROUTINE W REFLEX MICROSCOPIC - Abnormal; Notable for the following components:   Protein, ur 30 (*)    All other components within normal limits  BRAIN NATRIURETIC PEPTIDE - Abnormal; Notable for the following components:   B Natriuretic Peptide 191.8 (*)    All other components within normal limits  RESPIRATORY PANEL BY RT PCR (FLU A&B, COVID)    EKG EKG Interpretation  Date/Time:  Sunday Feb 11 2020 10:14:17 EDT Ventricular Rate:  99 PR Interval:  146 QRS Duration: 94 QT Interval:  350 QTC Calculation: 449 R Axis:   31 Text Interpretation: Normal sinus rhythm Normal ECG since last tracing no significant change Confirmed by Rolan BuccoBelfi, Estelene Carmack [phone]([id number]) on 02/11/2020 11:08:07 AM   Radiology DG Chest 2 View  Result Date: 02/11/2020 CLINICAL DATA:  Shortness of breath EXAM: CHEST - 2 VIEW COMPARISON:  January 23, 2020 FINDINGS: There is either scarring in the lateral right base or small right pleural effusion. There is apparent scarring in the right mid lung as well. Lungs elsewhere are clear. Heart size and pulmonary vascularity are normal. There is aortic atherosclerosis. No adenopathy. There is degenerative change in the thoracic spine. IMPRESSION: Scarring right mid lung. Question scarring lateral right base versus small right pleural effusion. Lungs elsewhere clear. Stable cardiac silhouette.  Aortic Atherosclerosis (ICD10-I70.0). Electronically Signed   By: Bretta BangWilliam  Woodruff III M.D.   On: 02/11/2020 11:40   CT Abdomen Pelvis W Contrast  Result Date: 02/11/2020 CLINICAL DATA:  Nausea and vomiting.  Weakness. Recent pneumonia  EXAM: CT ABDOMEN AND PELVIS WITH CONTRAST TECHNIQUE: Multidetector CT imaging of the abdomen and pelvis was performed using the standard protocol following bolus administration of intravenous contrast. CONTRAST:  167mL OMNIPAQUE IOHEXOL 300 MG/ML  SOLN COMPARISON:  None. FINDINGS: Lower Chest: No acute findings. Tiny pleural effusions noted bilaterally. Hepatobiliary: No hepatic masses identified. A few tiny sub-cm calcified gallstones are seen, however there is no evidence of cholecystitis or biliary dilatation. Pancreas:  No mass or inflammatory changes. Spleen: Within normal limits in size and appearance. Adrenals/Urinary Tract: No masses identified. No evidence of ureteral calculi or hydronephrosis. Stomach/Bowel: No evidence of obstruction, inflammatory process or abnormal fluid collections. Normal appendix visualized. Diverticulosis is seen mainly involving the sigmoid colon, however there is no evidence of diverticulitis. Vascular/Lymphatic: No pathologically enlarged lymph nodes. No abdominal aortic aneurysm. Aortic atherosclerosis noted. Reproductive:  No mass or other significant abnormality. Other:  None. Musculoskeletal:  No suspicious bone lesions identified. IMPRESSION: 1. Colonic diverticulosis, without radiographic evidence of diverticulitis or other acute findings. 2. Cholelithiasis. No radiographic evidence of cholecystitis. 3. Tiny bilateral pleural effusions. Aortic Atherosclerosis (ICD10-I70.0). Electronically Signed   By: Marlaine Hind M.D.   On: 02/11/2020 14:08    Procedures Procedures (including critical care time)  Medications Ordered in ED Medications  iohexol (OMNIPAQUE) 300 MG/ML solution 100 mL (100 mLs Intravenous Contrast Given 02/11/20 1347)    ED Course  I have reviewed the triage vital signs and the nursing notes.  Pertinent labs & imaging results that were available during my care of the patient were reviewed by me and considered in my medical  decision making (see chart for details).    MDM Rules/Calculators/A&P                     Patient is a 85 year old male who presents with generalized weakness and decline.  He has had 2 recent hospitalizations, first for pneumonia and second for anemia/hyponatremia.  Today on exam he is short of breath with some tachypnea at rest although he does not report shortness of breath at rest he is tachypneic.  He gets weak with minimal activity even walking to the bathroom.  He has had some leg swelling.  He has a downward trend in his hemoglobin which is 8 today.  It started with a baseline around 10 in March.  This could be contributing to his shortness of breath and leg swelling.  He also has a persistent elevation in his WBC count.  They have blood cultures done on his last admission that were negative.  He had a repeat CT scan of his chest which showed no acute infection with improvement of his prior pneumonia.  Given his nausea, increased WBC count or weakness, I did do a CT scan of his abdomen pelvis today which shows no acute abnormality.  He does not have any hypoxia.  There is no other suggestions of PE such as chest pain, tachycardia or hypoxia.  I do feel that this may partially be from deconditioning from his prior pneumonia.  However he has had a downward trend in his hemoglobin.  He did have a recent GI work-up including a colonoscopy in the ED which did not show any active bleeding.  I feel with his progressive symptoms and swelling, he may benefit from getting a blood transfusion and reassessing for improvement.  I spoke with Dr. Inda Merlin with the hospitalist service to admit the patient for further treatment. Final Clinical Impression(s) / ED Diagnoses Final diagnoses:  Generalized weakness  Iron deficiency anemia, unspecified iron deficiency anemia type  Shortness of breath    Rx / DC Orders ED Discharge Orders    None       Rolan Bucco, MD 02/11/20 1531

## 2020-02-11 NOTE — ED Triage Notes (Addendum)
Pt states he was diagnosed with pneumonia at the beginning of April, he states that he has been on medications at home but just continues to feel generally weak everyday. Pt states he feels so weak, even talking takes a lot of energy. He also reports low hemoglobin which he was on iron pills for. He denies pain or sob, however, resp are labored during triage.

## 2020-02-12 ENCOUNTER — Encounter (HOSPITAL_COMMUNITY): Payer: Self-pay | Admitting: Internal Medicine

## 2020-02-12 ENCOUNTER — Other Ambulatory Visit: Payer: Self-pay | Admitting: Oncology

## 2020-02-12 ENCOUNTER — Inpatient Hospital Stay (HOSPITAL_COMMUNITY): Payer: Medicare Other

## 2020-02-12 DIAGNOSIS — R627 Adult failure to thrive: Secondary | ICD-10-CM

## 2020-02-12 LAB — CBC
HCT: 27.4 % — ABNORMAL LOW (ref 39.0–52.0)
Hemoglobin: 8.4 g/dL — ABNORMAL LOW (ref 13.0–17.0)
MCH: 26.8 pg (ref 26.0–34.0)
MCHC: 30.7 g/dL (ref 30.0–36.0)
MCV: 87.5 fL (ref 80.0–100.0)
Platelets: 493 10*3/uL — ABNORMAL HIGH (ref 150–400)
RBC: 3.13 MIL/uL — ABNORMAL LOW (ref 4.22–5.81)
RDW: 15.4 % (ref 11.5–15.5)
WBC: 23.4 10*3/uL — ABNORMAL HIGH (ref 4.0–10.5)
nRBC: 0 % (ref 0.0–0.2)

## 2020-02-12 LAB — BASIC METABOLIC PANEL
Anion gap: 11 (ref 5–15)
BUN: 20 mg/dL (ref 8–23)
CO2: 24 mmol/L (ref 22–32)
Calcium: 7.9 mg/dL — ABNORMAL LOW (ref 8.9–10.3)
Chloride: 93 mmol/L — ABNORMAL LOW (ref 98–111)
Creatinine, Ser: 1.43 mg/dL — ABNORMAL HIGH (ref 0.61–1.24)
GFR calc Af Amer: 52 mL/min — ABNORMAL LOW (ref >60)
GFR calc non Af Amer: 45 mL/min — ABNORMAL LOW (ref >60)
Glucose, Bld: 110 mg/dL — ABNORMAL HIGH (ref 70–99)
Potassium: 3.9 mmol/L (ref 3.5–5.1)
Sodium: 128 mmol/L — ABNORMAL LOW (ref 135–145)

## 2020-02-12 LAB — SODIUM, URINE, RANDOM: Sodium, Ur: 78 mmol/L

## 2020-02-12 LAB — URINALYSIS, ROUTINE W REFLEX MICROSCOPIC
Bilirubin Urine: NEGATIVE
Glucose, UA: NEGATIVE mg/dL
Hgb urine dipstick: NEGATIVE
Ketones, ur: NEGATIVE mg/dL
Leukocytes,Ua: NEGATIVE
Nitrite: NEGATIVE
Protein, ur: 100 mg/dL — AB
Specific Gravity, Urine: 1.026 (ref 1.005–1.030)
pH: 7 (ref 5.0–8.0)

## 2020-02-12 LAB — LACTATE DEHYDROGENASE: LDH: 103 U/L (ref 98–192)

## 2020-02-12 LAB — IRON AND TIBC
Iron: 18 ug/dL — ABNORMAL LOW (ref 45–182)
Saturation Ratios: 14 % — ABNORMAL LOW (ref 17.9–39.5)
TIBC: 133 ug/dL — ABNORMAL LOW (ref 250–450)
UIBC: 115 ug/dL

## 2020-02-12 LAB — VITAMIN B12: Vitamin B-12: 436 pg/mL (ref 180–914)

## 2020-02-12 LAB — PROCALCITONIN: Procalcitonin: 0.53 ng/mL

## 2020-02-12 LAB — OSMOLALITY: Osmolality: 277 mOsm/kg (ref 275–295)

## 2020-02-12 LAB — FERRITIN: Ferritin: 1812 ng/mL — ABNORMAL HIGH (ref 24–336)

## 2020-02-12 LAB — CORTISOL: Cortisol, Plasma: 20.8 ug/dL

## 2020-02-12 LAB — TSH: TSH: 2.638 u[IU]/mL (ref 0.350–4.500)

## 2020-02-12 LAB — URIC ACID: Uric Acid, Serum: 3.8 mg/dL (ref 3.7–8.6)

## 2020-02-12 LAB — PATHOLOGIST SMEAR REVIEW

## 2020-02-12 LAB — OSMOLALITY, URINE: Osmolality, Ur: 531 mOsm/kg (ref 300–900)

## 2020-02-12 MED ORDER — BOOST / RESOURCE BREEZE PO LIQD CUSTOM
1.0000 | Freq: Three times a day (TID) | ORAL | Status: DC
  Administered 2020-02-12: 1 via ORAL

## 2020-02-12 MED ORDER — PRO-STAT SUGAR FREE PO LIQD
30.0000 mL | Freq: Three times a day (TID) | ORAL | Status: DC
  Administered 2020-02-12 – 2020-02-19 (×23): 30 mL via ORAL
  Filled 2020-02-12 (×23): qty 30

## 2020-02-12 MED ORDER — HYDROCODONE-ACETAMINOPHEN 5-325 MG PO TABS: 1.0000 | ORAL_TABLET | Freq: Four times a day (QID) | ORAL | Status: DC | PRN

## 2020-02-12 MED ORDER — FUROSEMIDE 40 MG PO TABS: 40.0000 mg | ORAL_TABLET | Freq: Every day | ORAL | Status: DC

## 2020-02-12 MED ORDER — LORAZEPAM 2 MG/ML IJ SOLN
1.0000 mg | Freq: Once | INTRAMUSCULAR | Status: AC
  Administered 2020-02-12: 1 mg via INTRAVENOUS
  Filled 2020-02-12: qty 1

## 2020-02-12 MED ORDER — SPIRONOLACTONE 25 MG PO TABS: 25.0000 mg | ORAL_TABLET | Freq: Every day | ORAL | Status: DC

## 2020-02-12 MED ORDER — LACTATED RINGERS IV SOLN: INTRAVENOUS | Status: AC

## 2020-02-12 MED ORDER — PANTOPRAZOLE SODIUM 40 MG PO TBEC
40.0000 mg | DELAYED_RELEASE_TABLET | Freq: Every day | ORAL | Status: DC
  Administered 2020-02-12 – 2020-02-19 (×8): 40 mg via ORAL
  Filled 2020-02-12 (×8): qty 1

## 2020-02-12 MED ORDER — ENSURE ENLIVE PO LIQD
237.0000 mL | Freq: Three times a day (TID) | ORAL | Status: DC
  Administered 2020-02-12 – 2020-02-19 (×22): 237 mL via ORAL
  Filled 2020-02-12: qty 237

## 2020-02-12 NOTE — Progress Notes (Signed)
New Admission Note: ? Arrival Method: Stretcher Mental Orientation: Alert and Oriented x4 Telemetry: Box 8 Assessment: Completed Skin: Refer to flowsheet IV: Right forearm Pain: 0/10 Tubes: None Safety Measures: Safety Fall Prevention Plan discussed with patient. Admission: Completed [facility] [place] Orientation: Patient has been orientated to the room, unit and the staff. Family: None at the bedside Orders have been reviewed and are being implemented. Will continue to monitor the patient. Call light has been placed within reach and bed alarm has been activated.  ? Donia Guiles, RN  Phone Number: [phone]

## 2020-02-12 NOTE — Evaluation (Signed)
Occupational Therapy Evaluation Patient Details Name: Jeffrey Ho MRN: [id number] DOB: 06/05/1935 Today's Date: 02/12/2020    History of Present Illness Jeffrey Ho is a 85 y.o. male with medical history significant of R eye blindness; carotid artery occlusion; HTN; and HLD.  He was admitted from 3/28-30 for CAP with demand ischemia; he was discharged on Augmentin.  During that admission, he had heme positive stools but had stable Hgb and no overt bleeding and so was planned for outpatient GI f/u.  He returned from 4/20-23 with symptomatic anemia and hyponatremia.  EGD/colonoscopy performed with reflux erosive esophagitis and diverticulosis/polyps, he was subsequently discharged home presented back to the hospital again on 02/11/2020 with generalized weakness, worsening anemia, hyponatremia and excessive fatigue and was admitted to the hospital for further work-up.   Clinical Impression   Pt present with decline in function and safety with ADLs and ADL mobility with impaired strength, balance and endurance. Pt is very HOH as well. Pt live sat home with his wife and up until recently was independent with ADLs/selfcare, useda cane for mobility and was driving. Pt currently requires sup with bed mobility, min A with UB ADLs, max - total A with LB ADLs and max A with toileting, min A +2 with transfers. Pt fatigues very easily. Pt would benefit from acute OT services to address impairments to maximize level of function and safety    Follow Up Recommendations  SNF    Equipment Recommendations  Other (comment)    Recommendations for Other Services       Precautions / Restrictions Precautions Precautions: Fall Restrictions Weight Bearing Restrictions: No      Mobility Bed Mobility Overal bed mobility: Needs Assistance Bed Mobility: Supine to Sit     Supine to sit: Supervision     General bed mobility comments: slow, labored  Transfers Overall transfer level: Needs  assistance Equipment used: 2 person hand held assist Transfers: Sit to/from Stand;Stand Pivot Transfers Sit to Stand: Min assist;+2 physical assistance Stand pivot transfers: Min assist            Balance Overall balance assessment: Needs assistance Sitting-balance support: Feet supported;No upper extremity supported Sitting balance-Leahy Scale: Fair Sitting balance - Comments: seated EOB   Standing balance support: During functional activity;Bilateral upper extremity supported Standing balance-Leahy Scale: Poor                             ADL either performed or assessed with clinical judgement   ADL Overall ADL's : Needs assistance/impaired Eating/Feeding: Set up;Sitting Eating/Feeding Details (indicate cue type and reason): pt eating veyr little Grooming: Wash/dry hands;Wash/dry face;Standing;Min guard   Upper Body Bathing: Sitting;Minimal assistance   Lower Body Bathing: Maximal assistance   Upper Body Dressing : Minimal assistance;Sitting   Lower Body Dressing: Total assistance   Toilet Transfer: Minimal assistance;+2 for safety/equipment;Ambulation;BSC;Cueing for safety   Toileting- Clothing Manipulation and Hygiene: Maximal assistance;Sit to/from stand       Functional mobility during ADLs: Minimal assistance;+2 for physical assistance;+2 for safety/equipment;Cueing for safety       Vision Baseline Vision/History: Wears glasses Patient Visual Report: No change from baseline       Perception     Praxis      Pertinent Vitals/Pain Pain Assessment: No/denies pain     Hand Dominance Right   Extremity/Trunk Assessment Upper Extremity Assessment Upper Extremity Assessment: Generalized weakness   Lower Extremity Assessment Lower Extremity Assessment: Defer to PT  evaluation   Cervical / Trunk Assessment Cervical / Trunk Assessment: Normal   Communication Communication Communication: HOH   Cognition Arousal/Alertness:  Awake/alert Behavior During Therapy: WFL for tasks assessed/performed Overall Cognitive Status: Within Functional Limits for tasks assessed                                     General Comments       Exercises     Shoulder Instructions      Home Living Family/patient expects to be discharged to:: Private residence Living Arrangements: Spouse/significant other Available Help at Discharge: Family Type of Home: House Home Access: Stairs to enter Secretary/administrator of Steps: 2 Entrance Stairs-Rails: None Home Layout: One level;Able to live on main level with bedroom/bathroom     Bathroom Shower/Tub: Producer, television/film/video: Handicapped height Bathroom Accessibility: Yes   Home Equipment: Environmental consultant - 4 wheels;Walker - 2 wheels;Cane - single point;Hand held shower head;Grab bars - tub/shower;Grab bars - toilet          Prior Functioning/Environment Level of Independence: Needs assistance  Gait / Transfers Assistance Needed: Patient states household ambulator with [facility] ADL's / Homemaking Assistance Needed: Wife assist recently due to progressive weakness            OT Problem List: Decreased strength;Impaired balance (sitting and/or standing);Decreased safety awareness;Decreased activity tolerance;Decreased coordination;Decreased knowledge of use of DME or AE      OT Treatment/Interventions: Self-care/ADL training;Therapeutic exercise;Neuromuscular education;DME and/or AE instruction;Therapeutic activities    OT Goals(Current goals can be found in the care plan section) Acute Rehab OT Goals Patient Stated Goal: Return home with wife to assist OT Goal Formulation: With patient Time For Goal Achievement: 02/26/20 Potential to Achieve Goals: Good ADL Goals Pt Will Perform Grooming: with supervision;with set-up;sitting Pt Will Perform Upper Body Bathing: with min guard assist;sitting Pt Will Perform Lower Body Bathing: with mod  assist;sitting/lateral leans Pt Will Perform Upper Body Dressing: with min guard assist;sitting Pt Will Transfer to Toilet: with min assist;ambulating Pt Will Perform Toileting - Clothing Manipulation and hygiene: with mod assist;sit to/from stand  OT Frequency: Min 2X/week   Barriers to D/C:            Co-evaluation PT/OT/SLP Co-Evaluation/Treatment: Yes Reason for Co-Treatment: For patient/therapist safety;To address functional/ADL transfers   OT goals addressed during session: ADL's and self-care;Proper use of Adaptive equipment and DME      AM-PAC OT "6 Clicks" Daily Activity     Outcome Measure Help from another person eating meals?: None Help from another person taking care of personal grooming?: A Little Help from another person toileting, which includes using toliet, bedpan, or urinal?: Total Help from another person bathing (including washing, rinsing, drying)?: A Lot Help from another person to put on and taking off regular upper body clothing?: A Little Help from another person to put on and taking off regular lower body clothing?: Total 6 Click Score: 14   End of Session Equipment Utilized During Treatment: Gait belt  Activity Tolerance: Patient limited by fatigue Patient left: in chair;with call bell/phone within reach;with chair alarm set;with family/visitor present;Other (comment)(Oncology NP present)  OT Visit Diagnosis: Unsteadiness on feet (R26.81);Other abnormalities of gait and mobility (R26.89);Muscle weakness (generalized) (M62.81)                Time: 4332-9518 OT Time Calculation (min): 37 min Charges:  OT General Charges $OT Visit: 1  Visit OT Evaluation $OT Eval Moderate Complexity: 1 Mod OT Treatments $Self Care/Home Management : 8-22 mins    Emmit Alexanders [facility] 02/12/2020, 3:09 PM

## 2020-02-12 NOTE — Progress Notes (Signed)
PT Cancellation Note  Patient Details Name: Jeffrey Ho MRN: [id number] DOB: 1935/02/19   Cancelled Treatment:    Reason Eval/Treat Not Completed: Patient at procedure or test/unavailable   Per Nursing Secretary, pt is off the floor at imaging;   Will follow up later today as time allows;  Otherwise, will follow up for PT tomorrow;   Thank you,  Van Clines, PT  Acute Rehabilitation Services Pager [phone] Office [phone]     Levi Aland 02/12/2020, 11:11 AM

## 2020-02-12 NOTE — Progress Notes (Signed)
PRELIMINARY NOTE: Consult received today (placed yesterday) on this 85 y/o [place] man with persistent leukocytosis in the setting of recent CAP. CXR yesterday nondoagmostic. Will obtain chest CT, review blood film, full consult to follow  GM

## 2020-02-12 NOTE — Progress Notes (Signed)
PROGRESS NOTE                                                                                                                                                                                                             Patient Demographics:    Jeffrey Ho, is a 85 y.o. male, DOB - July 01, 1935, ZOX:[id number]  Admit date - 02/11/2020   Admitting Physician Jeffrey Blue, MD  Outpatient Primary MD for the patient is Jeffrey Nevins, MD  LOS - 1  Chief Complaint  Patient presents with  . Weakness       Brief Narrative - Jeffrey Ho is a 85 y.o. male with medical history significant of R eye blindness; carotid artery occlusion; HTN; and HLD.  He was admitted from 3/28-30 for CAP with demand ischemia; he was discharged on Augmentin.  During that admission, he had heme positive stools but had stable Hgb and no overt bleeding and so was planned for outpatient GI f/u.  He returned from 4/20-23 with symptomatic anemia and hyponatremia.  EGD/colonoscopy performed with reflux erosive esophagitis and diverticulosis/polyps, he was subsequently discharged home presented back to the hospital again on 02/11/2020 with generalized weakness, worsening anemia, hyponatremia and excessive fatigue and was admitted to the hospital for further work-up.   Subjective:    Jeffrey Ho today has, No headache, No chest pain, No abdominal pain - No Nausea, No new weakness tingling or numbness, No Cough - SOB. Feels weak all over.   Assessment  & Plan :      1.  Failure to thrive, persistent fatigue and deconditioning with symptomatic recurrent anemia and leukocytosis. he now appears to be hemodynamically stable, anemia work-up is underway, peripheral smear has been ordered and pending, iron panel appears inconclusive with elevated ferritin which could be due to underlying nonspecific inflammation, and recent EGD colonoscopy at [facility] by Dr. Jena Ho which did not show any ongoing signs of bleeding,  stable LDH, oncology has been consulted and will see the patient soon.  CT chest ordered by oncology is pending.  No signs of ongoing bleed, patient on PPI will monitor.   2.  Deconditioning.  PT OT, may require placement.  MRI brain nonacute but does show atrophy.  3.  Hyponatremia with fluid overload and edema.  Likely due to third spacing of fluids due to severe protein calorie malnutrition, recent echo noted with preserved EF and mild diastolic dysfunction, no pulmonary edema hence I do not think this is CHF.  Once renal  function stabilizes will start gentle diuresis, continue protein supplementation.  4.  Severe PCM causing third spacing of fluid and edema.  Has been placed on protein supplements, LFTs appear stable with stable bilirubin, UA has mild proteinuria.  5.  AKI.  This is likely due to contrast nephropathy he received IV contrast for his CT abdomen and pelvis, gentle hydration, once renal function stabilizes we will try to gently diurese him.  6.  Dyslipidemia continue home dose statin.  7.  HX of pneumonia with lung scarring.  CT noncontrast ordered by oncology, will follow.  8.  Diverticulosis.  Stable.  Nonacute CT abdomen pelvis.  Monitor.    Condition - Fair  Family Communication  : Message left for wife Jeffrey Ho on [phone] on 02/12/2020 at 10:45 AM.  Code Status :  DNR  Consults  :  Onc  Disposition Plan  :    Status is: Inpatient  Remains inpatient appropriate because:Ongoing diagnostic testing needed not appropriate for outpatient work up   Dispo: The patient is from: Home              Anticipated d/c is to: SNF              Anticipated d/c date is: 3 days              Patient currently is not medically stable to d/c.  Had recurrent admissions for failure to thrive and fatigue, hematological work-up is being done to rule out underlying malignancy.   Procedures  :    CT Chest -   CT abdomen and pelvis with contrast -  1. Colonic diverticulosis,  without radiographic evidence of diverticulitis or other acute findings. 2. Cholelithiasis. No radiographic evidence of cholecystitis. 3. Tiny bilateral pleural effusions. Aortic Atherosclerosis  MRI brain - Non acute   PUD Prophylaxis : PPI  DVT Prophylaxis  :  Lovenox   Lab Results  Component Value Date   PLT 493 (H) 02/12/2020    Diet :  Diet Order            Diet Heart Room service appropriate? Yes; Fluid consistency: Thin  Diet effective now               Inpatient Medications Scheduled Meds: . atorvastatin  20 mg Oral Daily  . docusate sodium  100 mg Oral BID  . enoxaparin (LOVENOX) injection  40 mg Subcutaneous Q24H  . ferrous sulfate  325 mg Oral BID WC  . folic acid  1 mg Oral Daily  . furosemide  40 mg Oral Daily  . pantoprazole  40 mg Oral Daily  . spironolactone  25 mg Oral Daily   Continuous Infusions: PRN Meds:.acetaminophen **OR** [DISCONTINUED] acetaminophen, bisacodyl, hydrALAZINE, HYDROcodone-acetaminophen, [DISCONTINUED] ondansetron **OR** ondansetron (ZOFRAN) IV, polyethylene glycol  Antibiotics  :   Anti-infectives (From admission, onward)   None          Objective:   Vitals:   02/11/20 2320 02/12/20 0104 02/12/20 0447 02/12/20 0924  BP: 140/75 134/64 (!) 143/67 132/70  Pulse: (!) 103 (!) 107 (!) 108 (!) 101  Resp: (!) 26 (!) 28 (!) 30 (!) 22  Temp: 98.9 F (37.2 C) 98.4 F (36.9 C) 97.6 F (36.4 C) 97.8 F (36.6 C)  TempSrc: Oral Oral Oral Oral  SpO2: 98% 100% 97% 98%  Weight:      Height:        SpO2: 98 %  Wt Readings from Last 3 Encounters:  02/11/20 95.7 kg  01/23/20 97.5 kg  01/01/20 102 kg     Intake/Output Summary (Last 24 hours) at 02/12/2020 1044 Last data filed at 02/12/2020 0600 Gross per 24 hour  Intake 240 ml  Output 50 ml  Net 190 ml     Physical Exam  Awake Alert, No new F.N deficits, flat affect, hard of hearing [name],PERRAL Supple Neck,No JVD, No cervical lymphadenopathy appriciated.    Symmetrical Chest wall movement, Good air movement bilaterally, CTAB RRR,No Gallops,Rubs or new Murmurs, No Parasternal Heave +ve B.Sounds, Abd Soft, No tenderness, No organomegaly appriciated, No rebound - guarding or rigidity. No Cyanosis,   2+ leg edema    Data Review:    Recent Labs  Lab 02/11/20 1222 02/12/20 0357  WBC 18.5* 23.4*  HGB 8.0* 8.4*  HCT 26.6* 27.4*  PLT 421* 493*  MCV 88.4 87.5  MCH 26.6 26.8  MCHC 30.1 30.7  RDW 15.2 15.4  LYMPHSABS 2.4  --   MONOABS 1.2*  --   EOSABS 0.4  --   BASOSABS 0.1  --     Recent Labs  Lab 02/11/20 1222 02/12/20 0357 02/12/20 0739  NA 129* 128*  --   K 4.2 3.9  --   CL 95* 93*  --   CO2 26 24  --   GLUCOSE 108* 110*  --   BUN 20 20  --   CREATININE 1.23 1.43*  --   CALCIUM 7.9* 7.9*  --   AST 25  --   --   ALT 24  --   --   ALKPHOS 190*  --   --   BILITOT 0.6  --   --   ALBUMIN 1.4*  --   --   PROCALCITON  --   --  0.53  TSH  --   --  2.638  BNP 191.8*  --   --     Recent Labs  Lab 02/11/20 1222 02/11/20 1601 02/12/20 0739  BNP 191.8*  --   --   PROCALCITON  --   --  0.53  SARSCOV2NAA  --  NEGATIVE  --     ------------------------------------------------------------------------------------------------------------------ No results for input(s): CHOL, HDL, LDLCALC, TRIG, CHOLHDL, LDLDIRECT in the last 72 hours.  Lab Results  Component Value Date   HGBA1C 6.2 (H) 01/01/2020   ------------------------------------------------------------------------------------------------------------------ Recent Labs    02/12/20 0739  TSH 2.638   ------------------------------------------------------------------------------------------------------------------ Recent Labs    02/11/20 2126  VITAMINB12 436  FOLATE 14.2  FERRITIN 1,812*  TIBC 133*  IRON 18*  RETICCTPCT 2.1    Coagulation profile No results for input(s): INR, PROTIME in the last 168 hours.  No results for input(s): DDIMER in the last 72  hours.  Cardiac Enzymes No results for input(s): CKMB, TROPONINI, MYOGLOBIN in the last 168 hours.  Invalid input(s): CK ------------------------------------------------------------------------------------------------------------------    Component Value Date/Time   BNP 191.8 (H) 02/11/2020 1222    Micro Results Recent Results (from the past 240 hour(s))  Respiratory Panel by RT PCR (Flu A&B, Covid) - Nasopharyngeal Swab     Status: None   Collection Time: 02/11/20  4:01 PM   Specimen: Nasopharyngeal Swab  Result Value Ref Range Status   SARS Coronavirus 2 by RT PCR NEGATIVE NEGATIVE Final    Comment: (NOTE) SARS-CoV-2 target nucleic acids are NOT DETECTED. The SARS-CoV-2 RNA is generally detectable in upper respiratoy specimens during the acute phase of infection. The lowest concentration of SARS-CoV-2 viral copies this assay can detect is 131 copies/mL. A negative  result does not preclude SARS-Cov-2 infection and should not be used as the sole basis for treatment or other patient management decisions. A negative result may occur with  improper specimen collection/handling, submission of specimen other than nasopharyngeal swab, presence of viral mutation(s) within the areas targeted by this assay, and inadequate number of viral copies (<131 copies/mL). A negative result must be combined with clinical observations, patient history, and epidemiological information. The expected result is Negative. Fact Sheet for Patients:  PinkCheek.be Fact Sheet for Healthcare Providers:  GravelBags.it This test is not yet ap proved or cleared by the [place] FDA and  has been authorized for detection and/or diagnosis of SARS-CoV-2 by FDA under an Emergency Use Authorization (EUA). This EUA will remain  in effect (meaning this test can be used) for the duration of the COVID-19 declaration under Section 564(b)(1) of the Act, 21  U.S.C. section 360bbb-3(b)(1), unless the authorization is terminated or revoked sooner.    Influenza A by PCR NEGATIVE NEGATIVE Final   Influenza B by PCR NEGATIVE NEGATIVE Final    Comment: (NOTE) The Xpert Xpress SARS-CoV-2/FLU/RSV assay is intended as an aid in  the diagnosis of influenza from Nasopharyngeal swab specimens and  should not be used as a sole basis for treatment. Nasal washings and  aspirates are unacceptable for Xpert Xpress SARS-CoV-2/FLU/RSV  testing. Fact Sheet for Patients: PinkCheek.be Fact Sheet for Healthcare Providers: GravelBags.it This test is not yet approved or cleared by the [place] FDA and  has been authorized for detection and/or diagnosis of SARS-CoV-2 by  FDA under an Emergency Use Authorization (EUA). This EUA will remain  in effect (meaning this test can be used) for the duration of the  Covid-19 declaration under Section 564(b)(1) of the Act, 21  U.S.C. section 360bbb-3(b)(1), unless the authorization is  terminated or revoked. Performed at [facility], [place] [address], [place], [place] [address]   Culture, blood (routine x 2)     Status: None (Preliminary result)   Collection Time: 02/11/20  9:26 PM   Specimen: BLOOD  Result Value Ref Range Status   Specimen Description BLOOD LEFT ANTECUBITAL  Final   Special Requests   Final    BOTTLES DRAWN AEROBIC AND ANAEROBIC Blood Culture adequate volume   Culture   Final    NO GROWTH < 12 HOURS Performed at [facility], [place] [address], [place], [place] [address]    Report Status PENDING  Incomplete  Culture, blood (routine x 2)     Status: None (Preliminary result)   Collection Time: 02/11/20  9:53 PM   Specimen: BLOOD RIGHT HAND  Result Value Ref Range Status   Specimen Description BLOOD RIGHT HAND  Final   Special Requests   Final    BOTTLES DRAWN AEROBIC ONLY Blood Culture adequate volume   Culture   Final    NO  GROWTH < 12 HOURS Performed at [facility], [place] [address] [place], [place] [address]    Report Status PENDING  Incomplete    Radiology Reports DG Chest 2 View  Result Date: 02/11/2020 CLINICAL DATA:  Shortness of breath EXAM: CHEST - 2 VIEW COMPARISON:  January 23, 2020 FINDINGS: There is either scarring in the lateral right base or small right pleural effusion. There is apparent scarring in the right mid lung as well. Lungs elsewhere are clear. Heart size and pulmonary vascularity are normal. There is aortic atherosclerosis. No adenopathy. There is degenerative change in the thoracic  spine. IMPRESSION: Scarring right mid lung. Question scarring lateral right base versus small right pleural effusion. Lungs elsewhere clear. Stable cardiac silhouette.  Aortic Atherosclerosis (ICD10-I70.0). Electronically Signed   By: Bretta BangWilliam  Woodruff III M.D.   On: 02/11/2020 11:40   DG Chest 2 View  Result Date: 01/21/2020 CLINICAL DATA:  Pneumonia 2 months ago. Dry cough. EXAM: CHEST - 2 VIEW COMPARISON:  Chest x-ray dated 12/25/2019. FINDINGS: Improved aeration at the RIGHT lung base/costophrenic angle. No new opacity. Lungs are hyperexpanded. Chronic bronchitic changes noted centrally. Heart size and mediastinal contours are within normal limits. No acute or suspicious osseous finding. IMPRESSION: 1. No active cardiopulmonary disease. No evidence of pneumonia or pulmonary edema on today's exam. 2. Improved aeration at the RIGHT lung base compared to the previous chest x-ray of 12/25/2019. 3. Hyperexpanded lungs indicating COPD. Associated chronic bronchitic changes centrally. Electronically Signed   By: Bary RichardStan  Maynard M.D.   On: 01/21/2020 17:09   CT Chest W Contrast  Result Date: 01/23/2020 CLINICAL DATA:  85 year old male with generalized weakness. EXAM: CT CHEST WITH CONTRAST TECHNIQUE: Multidetector CT imaging of the chest was performed during intravenous contrast administration. CONTRAST:  75mL  OMNIPAQUE IOHEXOL 300 MG/ML  SOLN COMPARISON:  Chest radiograph dated 01/23/2020. Chest CT dated 12/29/2019 FINDINGS: Cardiovascular: There is no cardiomegaly or pericardial effusion. Coronary vascular calcification primarily involving the LAD and left circumflex artery. There is moderate atherosclerotic calcification of the thoracic aorta. No aneurysmal dilatation or dissection. Evaluation of the pulmonary arteries is limited due to suboptimal opacification. No definite pulmonary artery embolus identified. Mediastinum/Nodes: There is no hilar or mediastinal adenopathy. Right hilar lymph node measures 7 mm in short axis. The esophagus is grossly unremarkable. No mediastinal fluid collection. Lungs/Pleura: There is a small right pleural effusion, decreased in size since the prior CT. Right upper lobe streaky and nodular densities most consistent with pneumonia. Overall improvement of the right upper lobe densities since the prior CT. Clinical correlation and continued follow-up recommended. Additional nodular densities in the left apex and left lower lobe. There is no pneumothorax. The central airways are patent. There is mild bronchial wall thickening most consistent with bronchitis. Biapical scarring noted. Upper Abdomen: Small gallstones. Musculoskeletal: Degenerative changes of the spine. No acute osseous pathology. IMPRESSION: 1. Right upper lobe pneumonia, improved since the prior CT. Clinical correlation and continued follow-up recommended. 2. Small right pleural effusion, decreased in size since the prior CT. 3. Mild bronchial wall thickening most consistent with bronchitis. 4. Cholelithiasis. 5. Aortic Atherosclerosis (ICD10-I70.0). Electronically Signed   By: Elgie CollardArash  Radparvar M.D.   On: 01/23/2020 17:23   MR BRAIN WO CONTRAST  Result Date: 02/11/2020 CLINICAL DATA:  Subacute neuro deficit EXAM: MRI HEAD WITHOUT CONTRAST TECHNIQUE: Multiplanar, multiecho pulse sequences of the brain and surrounding  structures were obtained without intravenous contrast. COMPARISON:  02/17/2009 FINDINGS: Brain: No acute infarction, hemorrhage, hydrocephalus, extra-axial collection or mass lesion. Cerebral volume loss and ventriculomegaly that is progressed from 2010. No significant ischemic changes. Vascular: Normal flow voids Skull and upper cervical spine: Normal marrow signal Sinuses/Orbits: Marked atrophy of the right optic nerve compared to the left, see coronal reformats. There is history of blindness on the right per the chart. Bilateral mastoid opacification. Left sphenoid sinus opacification. IMPRESSION: 1. No acute or reversible finding. 2. Brain atrophy that has progressed from 2010. 3. Left sphenoid sinus and bilateral mastoid opacification with negative nasopharynx. Electronically Signed   By: Marnee SpringJonathon  Watts M.D.   On: 02/11/2020 19:17   CT  Abdomen Pelvis W Contrast  Result Date: 02/11/2020 CLINICAL DATA:  Nausea and vomiting. Weakness. Recent pneumonia EXAM: CT ABDOMEN AND PELVIS WITH CONTRAST TECHNIQUE: Multidetector CT imaging of the abdomen and pelvis was performed using the standard protocol following bolus administration of intravenous contrast. CONTRAST:  OMNIPAQUE IOHEXOL 300 MG/ML  SOLN COMPARISON:  None. FINDINGS: Lower Chest: No acute findings. Tiny pleural effusions noted bilaterally. Hepatobiliary: No hepatic masses identified. A few tiny sub-cm calcified gallstones are seen, however there is no evidence of cholecystitis or biliary dilatation. Pancreas:  No mass or inflammatory changes. Spleen: Within normal limits in size and appearance. Adrenals/Urinary Tract: No masses identified. No evidence of ureteral calculi or hydronephrosis. Stomach/Bowel: No evidence of obstruction, inflammatory process or abnormal fluid collections. Normal appendix visualized. Diverticulosis is seen mainly involving the sigmoid colon, however there is no evidence of diverticulitis. Vascular/Lymphatic: No  pathologically enlarged lymph nodes. No abdominal aortic aneurysm. Aortic atherosclerosis noted. Reproductive:  No mass or other significant abnormality. Other:  None. Musculoskeletal:  No suspicious bone lesions identified. IMPRESSION: 1. Colonic diverticulosis, without radiographic evidence of diverticulitis or other acute findings. 2. Cholelithiasis. No radiographic evidence of cholecystitis. 3. Tiny bilateral pleural effusions. Aortic Atherosclerosis (ICD10-I70.0). Electronically Signed   By: Danae Orleans M.D.   On: 02/11/2020 14:08   DG Chest Portable 1 View  Result Date: 01/23/2020 CLINICAL DATA:  Progressive weakness. Recent history of pneumonia. EXAM: PORTABLE CHEST 1 VIEW COMPARISON:  Chest x-ray dated 01/19/2020 and chest CT dated 12/29/2019 FINDINGS: The heart size and pulmonary vascularity are normal. Aortic atherosclerosis. Chronic blunting of the right costophrenic angle laterally. Chronic slight parenchymal scarring in the right midzone. Left lung is clear. No acute bone abnormality. Moderate arthritic changes of both glenohumeral joints. IMPRESSION: 1. No acute abnormalities. 2.  Aortic Atherosclerosis (ICD10-I70.0). Electronically Signed   By: Francene Boyers M.D.   On: 01/23/2020 13:45    Time Spent in minutes  30   Susa Raring M.D on 02/12/2020 at 10:44 AM  To page go to [email or web address] - password [facility]

## 2020-02-12 NOTE — Progress Notes (Signed)
OT Cancellation Note  Patient Details Name: Jeffrey Ho MRN: [id number] DOB: [birth year]   Cancelled Treatment:    Reason Eval/Treat Not Completed: Patient at procedure or test/ unavailable. Pt off unit for imaging per unit secretary. Will try back later as time allows/as appropriate  Galen Manila 02/12/2020, 11:23 AM

## 2020-02-12 NOTE — Progress Notes (Signed)
SLP Cancellation Note  Patient Details Name: Jeffrey Ho MRN: [id number] DOB: 05-Dec-1934   Cancelled treatment:       Reason Eval/Treat Not Completed: SLP screened, no needs identified, will sign off. SLP ordered for cognitive linguistic eval as part of stroke w/u. MRI is negative. Will defer eval at this time.   Harlon Ditty, MA CCC-SLP  Acute Rehabilitation Services Pager [phone] Office [phone]  Claudine Mouton 02/12/2020, 9:44 AM

## 2020-02-12 NOTE — Consult Note (Addendum)
[place]  [phone] [phone]  ID: Jeffrey Ho OB: [birth year] Jeffrey#: [id number] JOA#:[id number] PCP: Redmond School, MD   CHIEF COMPLAINT: Leukocytosis and anemia  INTERVAL HISTORY: Jeffrey Ho is an 85 year old male from [place], [place].  He has a past medical history significant for right eye blindness, carotid artery occlusion, hypertension, and hyperlipidemia.  The patient had a recent admission from 12/31/2019 through 01/02/2020 with CAP and demand ischemia.  He was discharged on Augmentin.  During that hospitalization, he was noted to have heme positive stools but had a stable hemoglobin and no overt bleeding so follow-up was planned as an outpatient.  He was again readmitted from 01/23/2020 through 01/26/2020 with symptomatic anemia and hyponatremia.  He underwent an EGD and colonoscopy which showed reflux erosive esophagitis and diverticulosis/polyps.  He presented to the emergency room again on 02/11/2020 secondary to worsening fatigue.  Walking short distances makes him very tired.  He is has persistent nausea.  He reports shortness of breath with minimal exertion.  His son-in-law is a physician who reports that he had been very vibrant and walks several miles a day and work on heavy equipment but has been slowing down since at least Christmas.  In the emergency room, he had a WBC of 18.5, hemoglobin 8.0, platelet count 421,000, absolute neutrophils 14.3, and absolute monocytes 1.2.  His sodium was 129, calcium 7.9, and albumin 1.4 alkaline phosphatase 190.  Additional labs have been obtained including a vitamin B12 which was 436, folate 14.2, iron 18, TIBC 133, and percent saturation 14%, ferritin 1812, absolute reticulocyte count 67.6, and percent immature reticulocyte fraction 27.3%.  LDH was obtained this morning and is normal at 103.  Hematology was asked see the patient to make recommendations regarding his leukocytosis and anemia.  REVIEW OF  SYSTEMS: The patient reports significant fatigue and weakness.  He has not had any recent fevers but reports that he has intermittent chills typically in the evenings.  States that he has chills 1-2 nights per week.  He denies night sweats.  He reports a decreased appetite and a weight loss of approximately 15 to 20 pounds over the past 1 to 2 months.  The patient denies headaches or dizziness.  Denies chest pain but reports shortness of breath with minimal exertion.  Denies cough.  Denies abdominal pain, nausea, vomiting, constipation, diarrhea.  Reports lower extremity edema.  The remainder of the review of systems is noncontributory.  Wife is at the bedside also notes that he has had some periods of confusion.  PAST MEDICAL HISTORY: Past Medical History:  Diagnosis Date   Blindness of right eye    Carotid artery occlusion    HOH (hard of hearing)    Hyperlipidemia    Hypertension    PAST SURGICAL HISTORY: Past Surgical History:  Procedure Laterality Date   BIOPSY  01/26/2020   Procedure: BIOPSY;  Surgeon: Daneil Dolin, MD;  [facility];  Service: Endoscopy;;  gastric biopsies for H. Pylori   CAROTID ENDARTERECTOMY     left CEA  06/24.2010   CAROTID ENDARTERECTOMY     right CEA  02/20/2009   COLONOSCOPY WITH PROPOFOL N/A 01/26/2020   Procedure: COLONOSCOPY WITH PROPOFOL;  Surgeon: Daneil Dolin, MD;  [facility];  Service: Endoscopy;  Laterality: N/A;   ESOPHAGOGASTRODUODENOSCOPY (EGD) WITH PROPOFOL N/A 01/26/2020   Procedure: ESOPHAGOGASTRODUODENOSCOPY (EGD) WITH PROPOFOL;  Surgeon: Daneil Dolin, MD;  [facility];  Service: Endoscopy;  Laterality: N/A;   EYE SURGERY Left Nov. 2015   Cataract   POLYPECTOMY  01/26/2020   Procedure: POLYPECTOMY;  Surgeon: Daneil Dolin, MD;  [facility];  Service: Endoscopy;;  Splenic Flexure polyp    FAMILY HISTORY Family History  Problem Relation Age of Onset   Heart disease Father         Before age 53    SOCIAL HISTORY: The patient is married to Valma Cava, who is my former patient (treated for breast cancer 2013-2018). This is Jeffrey Ho 3d marriage. He and Jeffrey Ho have been married 26 years. They live in [place], [place].  He has 2 children and 2 stepchildren.  Denies history of alcohol use.  Reports that he previously smoked cigarettes but quit in 1970.  ADVANCED DIRECTIVES: States that he has a living will.  His wife, Jeffrey Ho, is his healthcare power of attorney.  HEALTH MAINTENANCE: Social History   Tobacco Use   Smoking status: Former Smoker    Quit date: 08/10/1969    Years since quitting: 50.5   Smokeless tobacco: Never Used  Substance Use Topics   Alcohol use: No   Drug use: No   Colonoscopy: 01/26/2020 Bone density: unknown Lipid panel: unknown   No Known Allergies Current Facility-Administered Medications  Medication Dose Route Frequency Provider Last Rate Last Admin   acetaminophen (TYLENOL) tablet 650 mg  650 mg Oral Q6H PRN Karmen Bongo, MD       atorvastatin (LIPITOR) tablet 20 mg  20 mg Oral Daily Karmen Bongo, MD   20 mg at 02/12/20 0840   bisacodyl (DULCOLAX) EC tablet 5 mg  5 mg Oral Daily PRN Karmen Bongo, MD       docusate sodium (COLACE) capsule 100 mg  100 mg Oral BID Karmen Bongo, MD   100 mg at 02/12/20 0841   enoxaparin (LOVENOX) injection 40 mg  40 mg Subcutaneous Q24H Karmen Bongo, MD       ferrous sulfate tablet 325 mg  325 mg Oral BID WC Karmen Bongo, MD   325 mg at 73/22/02 5427   folic acid (FOLVITE) tablet 1 mg  1 mg Oral Daily Karmen Bongo, MD   1 mg at 02/12/20 0841   hydrALAZINE (APRESOLINE) injection 5 mg  5 mg Intravenous Q4H PRN Karmen Bongo, MD       HYDROcodone-acetaminophen (NORCO/VICODIN) 5-325 MG per tablet 1-2 tablet  1-2 tablet Oral Q4H PRN Karmen Bongo, MD       morphine 2 MG/ML injection 2 mg  2 mg Intravenous Q2H PRN Karmen Bongo, MD       ondansetron [facility]) injection  4 mg  4 mg Intravenous Q6H PRN Karmen Bongo, MD       pantoprazole (PROTONIX) EC tablet 40 mg  40 mg Oral Daily Thurnell Lose, MD   40 mg at 02/12/20 0840   polyethylene glycol (MIRALAX / GLYCOLAX) packet 17 g  17 g Oral Daily PRN Karmen Bongo, MD       zolpidem (AMBIEN) tablet 5 mg  5 mg Oral QHS PRN Karmen Bongo, MD   5 mg at 02/11/20 2202   OBJECTIVE: Vitals:   02/12/20 0447 02/12/20 0924  BP: (!) 143/67 132/70  Pulse: (!) 108 (!) 101  Resp: (!) 30 (!) 22  Temp: 97.6 F (36.4 C) 97.8 F (36.6 C)  SpO2: 97% 98%   Body mass index is 28.61 kg/m. ECOG FS:2 - Symptomatic, <50% confined to bed General: Elderly male, no distress  Ocular: Sclerae unicteric, pupils equal, round and reactive to light Ear-nose-throat: Oropharynx clear, dentition fair Lymphatic: No cervical or supraclavicular adenopathy Lungs no rales or rhonchi, good excursion bilaterally Heart regular rate and rhythm, no murmur appreciated, pitting edema in the bilateral lower extremities Abd soft, nontender, positive bowel sounds MSK no focal spinal tenderness, no joint edema Neuro: non-focal, well-oriented, appropriate affect  LAB RESULTS: CMP     Component Value Date/Time   NA 128 (L) 02/12/2020 0357   K 3.9 02/12/2020 0357   CL 93 (L) 02/12/2020 0357   CO2 24 02/12/2020 0357   GLUCOSE 110 (H) 02/12/2020 0357   BUN 20 02/12/2020 0357   CREATININE 1.43 (H) 02/12/2020 0357   CALCIUM 7.9 (L) 02/12/2020 0357   PROT 6.6 02/11/2020 1222   ALBUMIN 1.4 (L) 02/11/2020 1222   AST 25 02/11/2020 1222   ALT 24 02/11/2020 1222   ALKPHOS 190 (H) 02/11/2020 1222   BILITOT 0.6 02/11/2020 1222   GFRNONAA 45 (L) 02/12/2020 0357   GFRAA 52 (L) 02/12/2020 0357   INo results found for: SPEP, UPEP Lab Results  Component Value Date   WBC 23.4 (H) 02/12/2020   NEUTROABS 14.3 (H) 02/11/2020   HGB 8.4 (L) 02/12/2020   HCT 27.4 (L) 02/12/2020   MCV 87.5 02/12/2020   PLT 493 (H) 02/12/2020   @LASTCHEMISTRY @ No  results found for: LABCA2 No components found for: [id number] No results for input(s): INR in the last 168 hours. Urinalysis    Component Value Date/Time   COLORURINE YELLOW 02/12/2020 0841   APPEARANCEUR CLEAR 02/12/2020 0841   LABSPEC 1.026 02/12/2020 0841   PHURINE 7.0 02/12/2020 0841   GLUCOSEU NEGATIVE 02/12/2020 0841   HGBUR NEGATIVE 02/12/2020 0841   BILIRUBINUR NEGATIVE 02/12/2020 [place] 02/12/2020 0841   PROTEINUR 100 (A) 02/12/2020 0841   UROBILINOGEN 0.2 03/28/2009 0611   NITRITE NEGATIVE 02/12/2020 0841   LEUKOCYTESUR NEGATIVE 02/12/2020 0841   STUDIES: DG Chest 2 View  Result Date: 02/11/2020 CLINICAL DATA:  Shortness of breath EXAM: CHEST - 2 VIEW COMPARISON:  January 23, 2020 FINDINGS: There is either scarring in the lateral right base or small right pleural effusion. There is apparent scarring in the right mid lung as well. Lungs elsewhere are clear. Heart size and pulmonary vascularity are normal. There is aortic atherosclerosis. No adenopathy. There is degenerative change in the thoracic spine. IMPRESSION: Scarring right mid lung. Question scarring lateral right base versus small right pleural effusion. Lungs elsewhere clear. Stable cardiac silhouette.  Aortic Atherosclerosis (ICD10-I70.0). Electronically Signed   By: Lowella Grip III M.D.   On: 02/11/2020 11:40   DG Chest 2 View  Result Date: 01/21/2020 CLINICAL DATA:  Pneumonia 2 months ago. Dry cough. EXAM: CHEST - 2 VIEW COMPARISON:  Chest x-ray dated 12/25/2019. FINDINGS: Improved aeration at the RIGHT lung base/costophrenic angle. No new opacity. Lungs are hyperexpanded. Chronic bronchitic changes noted centrally. Heart size and mediastinal contours are within normal limits. No acute or suspicious osseous finding. IMPRESSION: 1. No active cardiopulmonary disease. No evidence of pneumonia or pulmonary edema on today's exam. 2. Improved aeration at the RIGHT lung base compared to the previous chest  x-ray of 12/25/2019. 3. Hyperexpanded lungs indicating COPD. Associated chronic bronchitic changes centrally. Electronically Signed   By: Franki Cabot M.D.   On: 01/21/2020 17:09   CT Chest W Contrast  Result Date: 01/23/2020 CLINICAL DATA:  84 year old male with generalized weakness. EXAM: CT CHEST WITH CONTRAST TECHNIQUE: Multidetector CT imaging of  the chest was performed during intravenous contrast administration. CONTRAST:  58m OMNIPAQUE IOHEXOL 300 MG/ML  SOLN COMPARISON:  Chest radiograph dated 01/23/2020. Chest CT dated 12/29/2019 FINDINGS: Cardiovascular: There is no cardiomegaly or pericardial effusion. Coronary vascular calcification primarily involving the LAD and left circumflex artery. There is moderate atherosclerotic calcification of the thoracic aorta. No aneurysmal dilatation or dissection. Evaluation of the pulmonary arteries is limited due to suboptimal opacification. No definite pulmonary artery embolus identified. Mediastinum/Nodes: There is no hilar or mediastinal adenopathy. Right hilar lymph node measures 7 mm in short axis. The esophagus is grossly unremarkable. No mediastinal fluid collection. Lungs/Pleura: There is a small right pleural effusion, decreased in size since the prior CT. Right upper lobe streaky and nodular densities most consistent with pneumonia. Overall improvement of the right upper lobe densities since the prior CT. Clinical correlation and continued follow-up recommended. Additional nodular densities in the left apex and left lower lobe. There is no pneumothorax. The central airways are patent. There is mild bronchial wall thickening most consistent with bronchitis. Biapical scarring noted. Upper Abdomen: Small gallstones. Musculoskeletal: Degenerative changes of the spine. No acute osseous pathology. IMPRESSION: 1. Right upper lobe pneumonia, improved since the prior CT. Clinical correlation and continued follow-up recommended. 2. Small right pleural effusion,  decreased in size since the prior CT. 3. Mild bronchial wall thickening most consistent with bronchitis. 4. Cholelithiasis. 5. Aortic Atherosclerosis (ICD10-I70.0). Electronically Signed   By: AAnner CreteM.D.   On: 01/23/2020 17:23   [facility] BRAIN WO CONTRAST  Result Date: 02/11/2020 CLINICAL DATA:  Subacute neuro deficit EXAM: MRI HEAD WITHOUT CONTRAST TECHNIQUE: Multiplanar, multiecho pulse sequences of the brain and surrounding structures were obtained without intravenous contrast. COMPARISON:  02/17/2009 FINDINGS: Brain: No acute infarction, hemorrhage, hydrocephalus, extra-axial collection or mass lesion. Cerebral volume loss and ventriculomegaly that is progressed from 2010. No significant ischemic changes. Vascular: Normal flow voids Skull and upper cervical spine: Normal marrow signal Sinuses/Orbits: Marked atrophy of the right optic nerve compared to the left, see coronal reformats. There is history of blindness on the right per the chart. Bilateral mastoid opacification. Left sphenoid sinus opacification. IMPRESSION: 1. No acute or reversible finding. 2. Brain atrophy that has progressed from 2010. 3. Left sphenoid sinus and bilateral mastoid opacification with negative nasopharynx. Electronically Signed   By: JMonte FantasiaM.D.   On: 02/11/2020 19:17   CT Abdomen Pelvis W Contrast  Result Date: 02/11/2020 CLINICAL DATA:  Nausea and vomiting. Weakness. Recent pneumonia EXAM: CT ABDOMEN AND PELVIS WITH CONTRAST TECHNIQUE: Multidetector CT imaging of the abdomen and pelvis was performed using the standard protocol following bolus administration of intravenous contrast. CONTRAST:  1012mOMNIPAQUE IOHEXOL 300 MG/ML  SOLN COMPARISON:  None. FINDINGS: Lower Chest: No acute findings. Tiny pleural effusions noted bilaterally. Hepatobiliary: No hepatic masses identified. A few tiny sub-cm calcified gallstones are seen, however there is no evidence of cholecystitis or biliary dilatation. Pancreas:  No mass  or inflammatory changes. Spleen: Within normal limits in size and appearance. Adrenals/Urinary Tract: No masses identified. No evidence of ureteral calculi or hydronephrosis. Stomach/Bowel: No evidence of obstruction, inflammatory process or abnormal fluid collections. Normal appendix visualized. Diverticulosis is seen mainly involving the sigmoid colon, however there is no evidence of diverticulitis. Vascular/Lymphatic: No pathologically enlarged lymph nodes. No abdominal aortic aneurysm. Aortic atherosclerosis noted. Reproductive:  No mass or other significant abnormality. Other:  None. Musculoskeletal:  No suspicious bone lesions identified. IMPRESSION: 1. Colonic diverticulosis, without radiographic evidence of diverticulitis or other acute  findings. 2. Cholelithiasis. No radiographic evidence of cholecystitis. 3. Tiny bilateral pleural effusions. Aortic Atherosclerosis (ICD10-I70.0). Electronically Signed   By: Marlaine Hind M.D.   On: 02/11/2020 14:08   DG Chest Portable 1 View  Result Date: 01/23/2020 CLINICAL DATA:  Progressive weakness. Recent history of pneumonia. EXAM: PORTABLE CHEST 1 VIEW COMPARISON:  Chest x-ray dated 01/19/2020 and chest CT dated 12/29/2019 FINDINGS: The heart size and pulmonary vascularity are normal. Aortic atherosclerosis. Chronic blunting of the right costophrenic angle laterally. Chronic slight parenchymal scarring in the right midzone. Left lung is clear. No acute bone abnormality. Moderate arthritic changes of both glenohumeral joints. IMPRESSION: 1. No acute abnormalities. 2.  Aortic Atherosclerosis (ICD10-I70.0). Electronically Signed   By: Lorriane Shire M.D.   On: 01/23/2020 13:45   ASSESSMENT: 85 y.o. [place], [place] male with persistent leukocytosis and anemia.  He has failure to thrive with progressive physical decline as well as anorexia and early satiety.  PLAN: Reviewed lab results with the patient and his wife.  Additional work-up recommended  including a CT of the chest and will review the peripheral blood smear.  Once we have these results, we can make additional recommendations for additional work-up.   Mikey Bussing, NP 02/12/2020 10:38 AM  \ADDENDUM:  85  y/o [place] man in fair health until an episode of pneumonia in March and declining since, with significant weight loss, fatigue, and SOB but currently minimal to no cough, no phlegm production and no pleurisy. He has a persistent leukocytosis not known to be present before the pneumonia episode. Differential shows primarily neutrophilia and REVIEW OF THE PERIPHERAL BLOOD FILM does not show evidence of leukemia or suggest a primary bone marrow process-- it is c/w a reactive process such as persistent infection or inflammation. The very elevated ferritin is also c/w this. In addition, the patient is severely anemic. He was worked up for this during his April admission and found to have significant diverticular disease and esophageal erosions, explaining his guaiac positivity. He has no nutritional deficiencies to account for his anemia and no significant renal dysfunction, but note the rise in his creatinine with contrast. The reticulocyte count is inappropriately normal and may be due to inadequate epo production by the kidneys (as well as epo resise in the setting of infection/inflammation)  Accordingly suggest:  No need for bone marrow biopsy No evidence of cancer (has had CTs of chest, abd and pelvis with no masses or adenopathy); will check markers Would treat as for residual pneumonia with a minimum 10 more days of antibiotics, then reasses labs Would transfuse to a Hb of 9.8-10.2. If patient's SOB and functional status significantly improves, we can consider epo injections as outpatient Would be glad to follow as outpatient or can be followed through our partner in [place] Dr Watt Climes after discharge  Will follow with you; appreciate consult.

## 2020-02-12 NOTE — Progress Notes (Signed)
Initial Nutrition Assessment  DOCUMENTATION CODES:   Not applicable  INTERVENTION:   Ensure Enlive po TID, each supplement provides 350 kcal and 20 grams of protein  Magic cup TID with meals, each supplement provides 290 kcal and 9 grams of protein  30 ml Prostat TID (per MD) - will discontinue if pt does not like it.   NUTRITION DIAGNOSIS:   Inadequate oral intake related to poor appetite as evidenced by (per chart review).  GOAL:   Patient will meet greater than or equal to 90% of their needs  MONITOR:   PO intake, Supplement acceptance, Weight trends  REASON FOR ASSESSMENT:   Consult Malnutrition Eval  ASSESSMENT:   Pt with PMH of R eye blindness, carotid artery occlusion, HTN, and HLD who was recently admitted 3/28 - 3/30 for CAP with demand ischemia. Pt again hospitalized 4/20 - 4/23 for erosive esophagitis and diverticulosis. He is now admitted with failure to thrive (progressive weakness, worsening anemia, hyponatremia, and excessive fatigue).   Per chart review pt has had a drastic change in the last few months. During his April admission he reported nausea, poor appetite, early satiety. Usual weight is reported at 225 lb.  Per admission weight pt with 5% weight loss in less than 2 months.  Suspect pt meets criteria for malnutrition.   Discussed nutrition plan with RN.   Medications reviewed and include: colace, ferrous sulfate, folic acid LR @ 100 ml/hr Labs reviewed: Na 128 (L)     NUTRITION - FOCUSED PHYSICAL EXAM:  Deferred; pt off unit currently   Diet Order:   Diet Order            Diet Heart Room service appropriate? Yes; Fluid consistency: Thin  Diet effective now              EDUCATION NEEDS:   No education needs have been identified at this time  Skin:  Skin Assessment: Reviewed RN Assessment  Last BM:  unknown  Height:   Ht Readings from Last 1 Encounters:  02/11/20 6' (1.829 m)    Weight:   Wt Readings from Last 1  Encounters:  02/11/20 95.7 kg    Ideal Body Weight:  80.9 kg  BMI:  Body mass index is 28.61 kg/m.  Estimated Nutritional Needs:   Kcal:  2100-2400  Protein:  115-130 grams  Fluid:  2 L/day  Cammy Copa., RD, LDN, CNSC See AMiON for contact information

## 2020-02-12 NOTE — Progress Notes (Signed)
Physical Therapy Treatment Patient Details Name: Jeffrey Ho MRN: [id number] DOB: December 13, 1934 Today's Date: 02/12/2020    History of Present Illness Jeffrey Ho is a 85 y.o. male with medical history significant of R eye blindness; carotid artery occlusion; HTN; and HLD.  He was admitted from 3/28-30 for CAP with demand ischemia; he was discharged on Augmentin.  During that admission, he had heme positive stools but had stable Hgb and no overt bleeding and so was planned for outpatient GI f/u.  He returned from 4/20-23 with symptomatic anemia and hyponatremia.  EGD/colonoscopy performed with reflux erosive esophagitis and diverticulosis/polyps, he was subsequently discharged home presented back to the hospital again on 02/11/2020 with generalized weakness, worsening anemia, hyponatremia and excessive fatigue and was admitted to the hospital for further work-up.    PT Comments    Pt admitted with above diagnosis. Comes from home where he lives with his wife; Pt is very [place] as well. Pt live sat home with his wife and up until recently was independent with ADLs/selfcare, useda cane for mobility and was driving. Presents to PT with generalized weakness, fatigue, functional dependencies;  Pt currently requires sup with bed mobility, min A with UB ADLs, max - total A with LB ADLs and max A with toileting, min A +2 with transfers. Pt fatigues very easily. Pt currently with functional limitations due to the deficits listed below (see PT Problem List). Pt will benefit from skilled PT to increase their independence and safety with mobility to allow discharge to the venue listed below.      Follow Up Recommendations  SNF     Equipment Recommendations  None recommended by PT    Recommendations for Other Services       Precautions / Restrictions Precautions Precautions: Fall Precaution Comments: Fatigues quickly Restrictions Weight Bearing Restrictions: No    Mobility  Bed Mobility Overal bed  mobility: Needs Assistance Bed Mobility: Supine to Sit     Supine to sit: Supervision     General bed mobility comments: slow, labored  Transfers Overall transfer level: Needs assistance Equipment used: 2 person hand held assist Transfers: Sit to/from Stand;Stand Pivot Transfers Sit to Stand: Min assist;+2 physical assistance Stand pivot transfers: Min assist       General transfer comment: Min assist to stand from elevated bed, recliner, and BSC; required 2 person min assist standing from 3in1 due to fatigue  Ambulation/Gait Ambulation/Gait assistance: Min assist Gait Distance (Feet): 20 Feet Assistive device: 2 person hand held assist Gait Pattern/deviations: Decreased step length - right;Step-through pattern;Decreased step length - left;Decreased stride length;Trunk flexed     General Gait Details: Slow, cues to self-monitor for activity tolerance   Stairs             Wheelchair Mobility    Modified Rankin (Stroke Patients Only)       Balance Overall balance assessment: Needs assistance Sitting-balance support: Feet supported;No upper extremity supported Sitting balance-Leahy Scale: Fair Sitting balance - Comments: seated EOB   Standing balance support: During functional activity;Bilateral upper extremity supported Standing balance-Leahy Scale: Poor                              Cognition Arousal/Alertness: Awake/alert Behavior During Therapy: WFL for tasks assessed/performed Overall Cognitive Status: Within Functional Limits for tasks assessed  Exercises      General Comments General comments (skin integrity, edema, etc.): Reported significant fatigue after going to bathroom      Pertinent Vitals/Pain Pain Assessment: No/denies pain    Home Living Family/patient expects to be discharged to:: Private residence Living Arrangements: Spouse/significant other Available Help at  Discharge: Family Type of Home: House Home Access: Stairs to enter Entrance Stairs-Rails: None Home Layout: One level;Able to live on main level with bedroom/bathroom Home Equipment: Walker - 4 wheels;Walker - 2 wheels;Cane - single point;Hand held shower head;Grab bars - tub/shower;Grab bars - toilet      Prior Function Level of Independence: Needs assistance  Gait / Transfers Assistance Needed: Patient states household ambulator with [facility] ADL's / Homemaking Assistance Needed: Wife assist recently due to progressive weakness     PT Goals (current goals can now be found in the care plan section) Acute Rehab PT Goals Patient Stated Goal: Get stronger PT Goal Formulation: With patient/family Time For Goal Achievement: 02/26/20 Potential to Achieve Goals: Fair    Frequency    Min 2X/week      PT Plan      Co-evaluation PT/OT/SLP Co-Evaluation/Treatment: Yes Reason for Co-Treatment: For patient/therapist safety PT goals addressed during session: Mobility/safety with mobility OT goals addressed during session: ADL's and self-care      AM-PAC PT "6 Clicks" Mobility   Outcome Measure  Help needed turning from your back to your side while in a flat bed without using bedrails?: None Help needed moving from lying on your back to sitting on the side of a flat bed without using bedrails?: A Little Help needed moving to and from a bed to a chair (including a wheelchair)?: A Little Help needed standing up from a chair using your arms (e.g., wheelchair or bedside chair)?: A Little Help needed to walk in hospital room?: A Little Help needed climbing 3-5 steps with a railing? : A Lot 6 Click Score: 18    End of Session Equipment Utilized During Treatment: Gait belt Activity Tolerance: Patient tolerated treatment well Patient left: in chair;with call bell/phone within reach;with chair alarm set Nurse Communication: Mobility status PT Visit Diagnosis: Unsteadiness on feet  (R26.81);Other abnormalities of gait and mobility (R26.89);Muscle weakness (generalized) (M62.81)     Time: 1230-1310 PT Time Calculation (min) (ACUTE ONLY): 40 min  Charges:    1 Eval Mod                    Van Clines, [place]  Acute Rehabilitation Services Pager [phone] Office [phone]    Levi Aland 02/12/2020, 5:40 PM

## 2020-02-12 NOTE — Progress Notes (Signed)
   02/12/20 0104  Assess: MEWS Score  Temp 98.4 F (36.9 C)  BP 134/64  Pulse Rate (!) 107  Resp (!) 28  Level of Consciousness Alert  SpO2 100 %  O2 Device Room Air  Assess: MEWS Score  MEWS Temp 0  MEWS Systolic 0  MEWS Pulse 1  MEWS RR 2  MEWS LOC 0  MEWS Score 3  MEWS Score Color Yellow  Assess: if the MEWS score is Yellow or Red  Were vital signs taken at a resting state? Yes  Focused Assessment Documented focused assessment  Early Detection of Sepsis Score *See Row Information* Medium  MEWS guidelines implemented *See Row Information* No, vital signs rechecked  Treat  MEWS Interventions Administered scheduled meds/treatments  Take Vital Signs  Increase Vital Sign Frequency  Yellow: Q 2hr X 2 then Q 4hr X 2, if remains yellow, continue Q 4hrs  Escalate  MEWS: Escalate Yellow: discuss with charge nurse/RN and consider discussing with provider and RRT  Notify: Charge Nurse/RN  Name of Charge Nurse/RN Notified Anita,RN  Date Charge Nurse/RN Notified 02/12/20  Time Charge Nurse/RN Notified 0104  Notify: Provider  Provider Name/Title Denny,NP  Date Provider Notified 02/12/20  Time Provider Notified 0120  Notification Type Page  Notification Reason Change in status  Response See new orders  Date of Provider Response 02/12/20  Time of Provider Response 0143  Document  Patient Outcome Not stable and remains on department  Progress note created (see row info) Yes

## 2020-02-12 NOTE — Plan of Care (Signed)
  Problem: Education: Goal: Knowledge of General Education information will improve Description: Including pain rating scale, medication(s)/side effects and non-pharmacologic comfort measures Outcome: Progressing   Problem: Safety: Goal: Ability to remain free from injury will improve Outcome: Progressing   

## 2020-02-12 NOTE — Progress Notes (Signed)
   02/11/20 2036  Assess: MEWS Score  Temp 98.4 F (36.9 C)  BP (!) 156/82  Pulse Rate (!) 109  Resp (!) 22  Level of Consciousness Alert  SpO2 100 %  O2 Device Room Air  Assess: MEWS Score  MEWS Temp 0  MEWS Systolic 0  MEWS Pulse 1  MEWS RR 1  MEWS LOC 0  MEWS Score 2  MEWS Score Color Yellow  Assess: if the MEWS score is Yellow or Red  Were vital signs taken at a resting state? Yes  Focused Assessment Documented focused assessment  Early Detection of Sepsis Score *See Row Information* Medium  MEWS guidelines implemented *See Row Information* Yes  Treat  MEWS Interventions Administered scheduled meds/treatments  Take Vital Signs  Increase Vital Sign Frequency  Yellow: Q 2hr X 2 then Q 4hr X 2, if remains yellow, continue Q 4hrs  Escalate  MEWS: Escalate Yellow: discuss with charge nurse/RN and consider discussing with provider and RRT  Notify: Charge Nurse/RN  Name of Charge Nurse/RN Notified Anita,RN  Date Charge Nurse/RN Notified 02/11/20  Time Charge Nurse/RN Notified 2130  Notify: Provider  Provider Name/Title Denny,NP  Date Provider Notified 02/11/20  Time Provider Notified 2052  Notification Type Page  Notification Reason Change in status  Response See new orders  Date of Provider Response 02/11/20  Time of Provider Response 2114  Notify: Rapid Response  Name of Rapid Response RN Notified Onalee Hua, RN  Date Rapid Response Notified 02/11/20  Time Rapid Response Notified 2315  Document  Patient Outcome Not stable and remains on department  Progress note created (see row info) Yes

## 2020-02-13 LAB — CBC WITH DIFFERENTIAL/PLATELET
Abs Immature Granulocytes: 0.23 10*3/uL — ABNORMAL HIGH (ref 0.00–0.07)
Basophils Absolute: 0.1 10*3/uL (ref 0.0–0.1)
Basophils Relative: 0 %
Eosinophils Absolute: 0.3 10*3/uL (ref 0.0–0.5)
Eosinophils Relative: 2 %
HCT: 24.8 % — ABNORMAL LOW (ref 39.0–52.0)
Hemoglobin: 7.7 g/dL — ABNORMAL LOW (ref 13.0–17.0)
Immature Granulocytes: 1 %
Lymphocytes Relative: 12 %
Lymphs Abs: 2.5 10*3/uL (ref 0.7–4.0)
MCH: 26.6 pg (ref 26.0–34.0)
MCHC: 31 g/dL (ref 30.0–36.0)
MCV: 85.5 fL (ref 80.0–100.0)
Monocytes Absolute: 1.5 10*3/uL — ABNORMAL HIGH (ref 0.1–1.0)
Monocytes Relative: 7 %
Neutro Abs: 16.6 10*3/uL — ABNORMAL HIGH (ref 1.7–7.7)
Neutrophils Relative %: 78 %
Platelets: 422 10*3/uL — ABNORMAL HIGH (ref 150–400)
RBC: 2.9 MIL/uL — ABNORMAL LOW (ref 4.22–5.81)
RDW: 15.3 % (ref 11.5–15.5)
WBC: 21.2 10*3/uL — ABNORMAL HIGH (ref 4.0–10.5)
nRBC: 0 % (ref 0.0–0.2)

## 2020-02-13 LAB — HEMOGLOBIN AND HEMATOCRIT, BLOOD
HCT: 25.2 % — ABNORMAL LOW (ref 39.0–52.0)
Hemoglobin: 8.1 g/dL — ABNORMAL LOW (ref 13.0–17.0)

## 2020-02-13 LAB — COMPREHENSIVE METABOLIC PANEL
ALT: 25 U/L (ref 0–44)
AST: 37 U/L (ref 15–41)
Albumin: 1.2 g/dL — ABNORMAL LOW (ref 3.5–5.0)
Alkaline Phosphatase: 184 U/L — ABNORMAL HIGH (ref 38–126)
Anion gap: 11 (ref 5–15)
BUN: 23 mg/dL (ref 8–23)
CO2: 23 mmol/L (ref 22–32)
Calcium: 7.5 mg/dL — ABNORMAL LOW (ref 8.9–10.3)
Chloride: 94 mmol/L — ABNORMAL LOW (ref 98–111)
Creatinine, Ser: 1.28 mg/dL — ABNORMAL HIGH (ref 0.61–1.24)
GFR calc Af Amer: >60 mL/min (ref >60)
GFR calc non Af Amer: 52 mL/min — ABNORMAL LOW (ref >60)
Glucose, Bld: 114 mg/dL — ABNORMAL HIGH (ref 70–99)
Potassium: 3.6 mmol/L (ref 3.5–5.1)
Sodium: 128 mmol/L — ABNORMAL LOW (ref 135–145)
Total Bilirubin: 0.8 mg/dL (ref 0.3–1.2)
Total Protein: 5.9 g/dL — ABNORMAL LOW (ref 6.5–8.1)

## 2020-02-13 LAB — C-REACTIVE PROTEIN: CRP: 19.9 mg/dL — ABNORMAL HIGH (ref <1.0)

## 2020-02-13 LAB — PSA: Prostatic Specific Antigen: 0.08 ng/mL (ref 0.00–4.00)

## 2020-02-13 LAB — PROCALCITONIN: Procalcitonin: 0.58 ng/mL

## 2020-02-13 LAB — PREPARE RBC (CROSSMATCH)

## 2020-02-13 LAB — MAGNESIUM: Magnesium: 1.7 mg/dL (ref 1.7–2.4)

## 2020-02-13 LAB — BRAIN NATRIURETIC PEPTIDE: B Natriuretic Peptide: 159.4 pg/mL — ABNORMAL HIGH (ref 0.0–100.0)

## 2020-02-13 MED ORDER — POTASSIUM CHLORIDE CRYS ER 20 MEQ PO TBCR
20.0000 meq | EXTENDED_RELEASE_TABLET | Freq: Once | ORAL | Status: AC
  Administered 2020-02-13: 20 meq via ORAL
  Filled 2020-02-13: qty 1

## 2020-02-13 MED ORDER — FUROSEMIDE 40 MG PO TABS
40.0000 mg | ORAL_TABLET | Freq: Once | ORAL | Status: AC
  Administered 2020-02-13: 40 mg via ORAL
  Filled 2020-02-13: qty 1

## 2020-02-13 MED ORDER — SODIUM CHLORIDE 0.9% IV SOLUTION: Freq: Once | INTRAVENOUS | Status: DC

## 2020-02-13 MED ORDER — AZITHROMYCIN 500 MG PO TABS
250.0000 mg | ORAL_TABLET | Freq: Every day | ORAL | Status: AC
  Administered 2020-02-13 – 2020-02-19 (×7): 250 mg via ORAL
  Filled 2020-02-13 (×7): qty 1

## 2020-02-13 MED ORDER — MAGNESIUM SULFATE IN D5W 1-5 GM/100ML-% IV SOLN
1.0000 g | Freq: Once | INTRAVENOUS | Status: AC
  Administered 2020-02-13: 1 g via INTRAVENOUS
  Filled 2020-02-13: qty 100

## 2020-02-13 MED ORDER — METRONIDAZOLE IN NACL 5-0.79 MG/ML-% IV SOLN
500.0000 mg | Freq: Three times a day (TID) | INTRAVENOUS | Status: DC
  Administered 2020-02-13 – 2020-02-15 (×8): 500 mg via INTRAVENOUS
  Filled 2020-02-13 (×8): qty 100

## 2020-02-13 MED ORDER — ACETAMINOPHEN 325 MG PO TABS
650.0000 mg | ORAL_TABLET | Freq: Once | ORAL | Status: AC
  Administered 2020-02-13: 650 mg via ORAL
  Filled 2020-02-13: qty 2

## 2020-02-13 NOTE — Plan of Care (Signed)
  Problem: Health Behavior/Discharge Planning: Goal: Ability to manage health-related needs will improve Outcome: Progressing   Problem: Education: Goal: Knowledge of General Education information will improve Description: Including pain rating scale, medication(s)/side effects and non-pharmacologic comfort measures Outcome: Progressing   

## 2020-02-13 NOTE — Plan of Care (Signed)
  Problem: Education: Goal: Knowledge of General Education information will improve Description: Including pain rating scale, medication(s)/side effects and non-pharmacologic comfort measures Outcome: Progressing   Problem: Safety: Goal: Ability to remain free from injury will improve Outcome: Progressing   

## 2020-02-13 NOTE — TOC Initial Note (Signed)
Transition of Care [facility]) - Initial/Assessment Note    Patient Details  Name: Jeffrey Ho MRN: [id number] Date of Birth: 1935/05/13  [facility] CM/SW Contact:    Emeterio Reeve, [place] Phone Number: 02/13/2020, 1:43 PM  Clinical Narrative:                 CSW met with pt at bedside. CSW introduced self and explained her role at the hospital. Pts wife [name], was present during the assessment and provided all of the information. Pt was living at home with wife. [name] stated this is pts 4th hospital visit in 6 months. [name] stated that with each admission pts mobility has decreased. Wife stated 6 months ago pt was walking and taking care of himself and now he is not walking and is completely dependent on her. [name] stated that PTA he was receiving Home Health services from [place].   CSW and [name] discussed PT/OT recommendations of SNF. [name] stated that she would prefer that pt comes home but now understands that SNF my be a better option if he can regain his mobility. [name] stated that they are interested in [facility]. CSW asked if she can send pts information out to other facilities in the are as back up. [name] stated she is fine with that but [facility] is the preference.   CSW will continue to follow.  Expected Discharge Plan: Skilled Nursing Facility Barriers to Discharge: Continued Medical Work up   Patient Goals and CMS Choice Patient states their goals for this hospitalization and ongoing recovery are:: "To go back home" CMS Medicare.gov Compare Post Acute Care list provided to:: Patient Choice offered to / list presented to : Patient, Spouse  Expected Discharge Plan and Services Expected Discharge Plan: New Post arrangements for the past 2 months: Single Family Home                                      Prior Living Arrangements/Services Living arrangements for the past 2 months: Single Family  Home Lives with:: Spouse Patient language and need for interpreter reviewed:: Yes Do you feel safe going back to the place where you live?: Yes      Need for Family Participation in Patient Care: Yes (Comment) Care giver support system in place?: Yes (comment)   Criminal Activity/Legal Involvement Pertinent to Current Situation/Hospitalization: No - Comment as needed  Activities of Daily Living Home Assistive Devices/Equipment: Cane (specify quad or straight), Walker (specify type), Wheelchair ADL Screening (condition at time of admission) Patient's cognitive ability adequate to safely complete daily activities?: Yes Is the patient deaf or have difficulty hearing?: Yes Does the patient have difficulty seeing, even when wearing glasses/contacts?: Yes Does the patient have difficulty concentrating, remembering, or making decisions?: No Patient able to express need for assistance with ADLs?: Yes Does the patient have difficulty dressing or bathing?: No Independently performs ADLs?: No Toileting: Needs assistance Does the patient have difficulty walking or climbing stairs?: Yes Weakness of Legs: Both Weakness of Arms/Hands: None  Permission Sought/Granted Permission sought to share information with : Family Supports Permission granted to share information with : Yes, Verbal Permission Granted  Share Information with NAME: [name]     Permission granted to share info w Relationship: Wife  Permission granted to share info w Contact Information: [phone]  Emotional Assessment Appearance::  Appears stated age Attitude/Demeanor/Rapport: Unresponsive Affect (typically observed): Flat, Withdrawn Orientation: : Oriented to Self, Oriented to Place, Oriented to  Time, Oriented to Situation Alcohol / Substance Use: Not Applicable Psych Involvement: No (comment)  Admission diagnosis:  Shortness of breath [R06.02] Failure to thrive in adult [R62.7] Generalized weakness [R53.1] Iron  deficiency anemia, unspecified iron deficiency anemia type [D50.9] Patient Active Problem List   Diagnosis Date Noted  . Failure to thrive in adult 02/11/2020  . Tachypnea 02/11/2020  . Physical deconditioning 02/11/2020  . Weakness 01/24/2020  . Symptomatic anemia 01/23/2020  . Prolonged QT interval 01/23/2020  . Hyponatremia 01/23/2020  . Hypoalbuminemia due to protein-calorie malnutrition ([place]) 01/23/2020  . Leukocytosis 01/23/2020  . HTN (hypertension) 12/31/2019  . Carotid artery disease without cerebral infarction ([place]) 12/31/2019  . Cardiac ischemia 12/31/2019  . CAP (community acquired pneumonia) 12/31/2019  . Hyperlipidemia 12/31/2019  . Heme positive stool 12/31/2019  . Occlusion and stenosis of carotid artery without mention of cerebral infarction 08/22/2012   PCP:  Redmond School, MD Pharmacy:   [place], [place] - [place] [address] [id number] [place] [address] Phone: [phone] Fax: [phone]     Social Determinants of Health (SDOH) Interventions    Readmission Risk Interventions No flowsheet data found.  Emeterio Reeve, Latanya Presser, [place] Social Worker [phone]

## 2020-02-13 NOTE — Progress Notes (Signed)
PROGRESS NOTE                                                                                                                                                                                                             Patient Demographics:    Jeffrey Ho, is a 85 y.o. male, DOB - 1935/01/31, JIR:[id number]  Admit date - 02/11/2020   Admitting Physician Jonah Blue, MD  Outpatient Primary MD for the patient is [name], MD  LOS - 2  Chief Complaint  Patient presents with  . Weakness       Brief Narrative - Jeffrey Ho is a 85 y.o. male with medical history significant of R eye blindness; carotid artery occlusion; HTN; and HLD.  He was admitted from 3/28-30 for CAP with demand ischemia; he was discharged on Augmentin.  During that admission, he had heme positive stools but had stable Hgb and no overt bleeding and so was planned for outpatient GI f/u.  He returned from 4/20-23 with symptomatic anemia and hyponatremia.  EGD/colonoscopy performed with reflux erosive esophagitis and diverticulosis/polyps, he was subsequently discharged home presented back to the hospital again on 02/11/2020 with generalized weakness, worsening anemia, hyponatremia and excessive fatigue and was admitted to the hospital for further work-up.   Subjective:   Patient in bed, appears comfortable, denies any headache, no fever, no chest pain or pressure, no shortness of breath , no abdominal pain. No focal weakness but does feel weak all over however slightly better than yesterday.   Assessment  & Plan :     1.  Failure to thrive, persistent fatigue and deconditioning with symptomatic recurrent anemia and leukocytosis. he now appears to be hemodynamically stable, seen by hematology oncology, CT chest abdomen pelvis nonacute with no suspicion for malignancy.  His decline seems to be age-related although there could be a small possibility of a lingering infection for which we will challenge him  with 7 days of azithromycin and Flagyl since he has history of pneumonia and diverticulosis.  Continue PT OT and dietary supplements.  Of note he had recent EGD and colonoscopy in [place] by Dr. [name] which was nonacute as well.  2.  Deconditioning.  PT OT, may require placement.  MRI brain nonacute but does show atrophy.  3.  Hyponatremia with fluid overload and edema.  Likely due to third spacing of fluids due to severe protein calorie malnutrition, recent echo noted with preserved EF and mild diastolic dysfunction, no pulmonary edema hence I  do not think this is CHF.  Renal function has stabilized will continue TED stockings and try gentle oral Lasix.  4.  Severe PCM causing third spacing of fluid and edema.  Has been placed on protein supplements, LFTs appear stable with stable bilirubin, UA has mild proteinuria.  Outpatient nephrology follow-up would suffice.  5.  AKI.  This is likely due to contrast nephropathy he received IV contrast for his CT abdomen and pelvis, post gentle hydration renal function is stable will manage low-dose Lasix .  6.  Dyslipidemia continue home dose statin.  7.  HX of pneumonia with lung scarring.  CT noncontrast stable see #1 above.  8.  Diverticulosis.  Stable.  Nonacute CT abdomen pelvis.  See #1 above and monitor.    Condition - Fair  Family Communication  :   Message left for wife Jeffrey Ho on [phone] on 02/12/2020 at 10:45 AM.   Wife updated bedside 02/13/20   Code Status :  DNR  Consults  :  Onc  Disposition Plan  :    Status is: Inpatient  Remains inpatient appropriate because:Ongoing diagnostic testing needed not appropriate for outpatient work up   Dispo: The patient is from: Home              Anticipated d/c is to: SNF              Anticipated d/c date is: 1-2 days              Patient currently is not medically stable to d/c.  Had recurrent admissions for failure to thrive and fatigue, hematological work-up is being done to rule  out underlying malignancy.   Procedures  :    CT Chest - Non acute  CT abdomen and pelvis with contrast -  1. Colonic diverticulosis, without radiographic evidence of diverticulitis or other acute findings. 2. Cholelithiasis. No radiographic evidence of cholecystitis. 3. Tiny bilateral pleural effusions. Aortic Atherosclerosis  MRI brain - Non acute   PUD Prophylaxis : PPI  DVT Prophylaxis  :  Lovenox   Lab Results  Component Value Date   PLT 422 (H) 02/13/2020    Diet :  Diet Order            Diet Heart Room service appropriate? Yes; Fluid consistency: Thin  Diet effective now               Inpatient Medications Scheduled Meds: . atorvastatin  20 mg Oral Daily  . azithromycin  250 mg Oral Daily  . docusate sodium  100 mg Oral BID  . enoxaparin (LOVENOX) injection  40 mg Subcutaneous Q24H  . feeding supplement (ENSURE ENLIVE)  237 mL Oral TID BM  . feeding supplement (PRO-STAT SUGAR FREE 64)  30 mL Oral TID WC  . ferrous sulfate  325 mg Oral BID WC  . folic acid  1 mg Oral Daily  . pantoprazole  40 mg Oral Daily   Continuous Infusions: . magnesium sulfate bolus IVPB 1 g (02/13/20 0943)  . metronidazole 500 mg (02/13/20 0836)   PRN Meds:.acetaminophen **OR** [DISCONTINUED] acetaminophen, bisacodyl, hydrALAZINE, [DISCONTINUED] ondansetron **OR** ondansetron (ZOFRAN) IV, polyethylene glycol  Antibiotics  :   Anti-infectives (From admission, onward)   Start     Dose/Rate Route Frequency Ordered Stop   02/13/20 1000  azithromycin (ZITHROMAX) tablet 250 mg     250 mg Oral Daily 02/13/20 0811 02/20/20 0959   02/13/20 0900  metroNIDAZOLE (FLAGYL) IVPB 500 mg     500  mg 100 mL/hr over 60 Minutes Intravenous Every 8 hours 02/13/20 0811 02/20/20 0859          Objective:   Vitals:   02/12/20 1300 02/12/20 2055 02/13/20 0406 02/13/20 0758  BP: 138/75 (!) 164/79 128/63 133/68  Pulse: 95 (!) 110 (!) 105 92  Resp: 20 20 18 20   Temp: 98.7 F (37.1 C) 98.1 F (36.7  C) 98.6 F (37 C)   TempSrc:  Oral Oral   SpO2: 98% 98% 96% 98%  Weight:  98.1 kg    Height:        SpO2: 98 %  Wt Readings from Last 3 Encounters:  02/12/20 98.1 kg  01/23/20 97.5 kg  01/01/20 102 kg     Intake/Output Summary (Last 24 hours) at 02/13/2020 1029 Last data filed at 02/13/2020 0600 Gross per 24 hour  Intake 720 ml  Output 1501 ml  Net -781 ml     Physical Exam  Awake Alert, No new F.N deficits, hard of hearing [place].AT,PERRAL Supple Neck,No JVD, No cervical lymphadenopathy appriciated.  Symmetrical Chest wall movement, Good air movement bilaterally, CTAB RRR,No Gallops, Rubs or new Murmurs, No Parasternal Heave +ve B.Sounds, Abd Soft, No tenderness, No organomegaly appriciated, No rebound - guarding or rigidity. No Cyanosis, 1+ leg edema, TEDS    Data Review:    Recent Labs  Lab 02/11/20 1222 02/12/20 0357 02/13/20 0317  WBC 18.5* 23.4* 21.2*  HGB 8.0* 8.4* 7.7*  HCT 26.6* 27.4* 24.8*  PLT 421* 493* 422*  MCV 88.4 87.5 85.5  MCH 26.6 26.8 26.6  MCHC 30.1 30.7 31.0  RDW 15.2 15.4 15.3  LYMPHSABS 2.4  --  2.5  MONOABS 1.2*  --  1.5*  EOSABS 0.4  --  0.3  BASOSABS 0.1  --  0.1    Recent Labs  Lab 02/11/20 1222 02/12/20 0357 02/12/20 0739 02/13/20 0317  NA 129* 128*  --  128*  K 4.2 3.9  --  3.6  CL 95* 93*  --  94*  CO2 26 24  --  23  GLUCOSE 108* 110*  --  114*  BUN 20 20  --  23  CREATININE 1.23 1.43*  --  1.28*  CALCIUM 7.9* 7.9*  --  7.5*  AST 25  --   --  37  ALT 24  --   --  25  ALKPHOS 190*  --   --  184*  BILITOT 0.6  --   --  0.8  ALBUMIN 1.4*  --   --  1.2*  MG  --   --   --  1.7  CRP  --   --   --  19.9*  PROCALCITON  --   --  0.53 0.58  TSH  --   --  2.638  --   BNP 191.8*  --   --  159.4*    Recent Labs  Lab 02/11/20 1222 02/11/20 1601 02/12/20 0739 02/13/20 0317  CRP  --   --   --  19.9*  BNP 191.8*  --   --  159.4*  PROCALCITON  --   --  0.53 0.58  SARSCOV2NAA  --  NEGATIVE  --   --       ------------------------------------------------------------------------------------------------------------------ No results for input(s): CHOL, HDL, LDLCALC, TRIG, CHOLHDL, LDLDIRECT in the last 72 hours.  Lab Results  Component Value Date   HGBA1C 6.2 (H) 01/01/2020   ------------------------------------------------------------------------------------------------------------------ Recent Labs    02/12/20 0739  TSH 2.638   ------------------------------------------------------------------------------------------------------------------  Recent Labs    02/11/20 2126  VITAMINB12 436  FOLATE 14.2  FERRITIN 1,812*  TIBC 133*  IRON 18*  RETICCTPCT 2.1    Coagulation profile No results for input(s): INR, PROTIME in the last 168 hours.  No results for input(s): DDIMER in the last 72 hours.  Cardiac Enzymes No results for input(s): CKMB, TROPONINI, MYOGLOBIN in the last 168 hours.  Invalid input(s): CK ------------------------------------------------------------------------------------------------------------------    Component Value Date/Time   BNP 159.4 (H) 02/13/2020 5597    Micro Results Recent Results (from the past 240 hour(s))  Respiratory Panel by RT PCR (Flu A&B, Covid) - Nasopharyngeal Swab     Status: None   Collection Time: 02/11/20  4:01 PM   Specimen: Nasopharyngeal Swab  Result Value Ref Range Status   SARS Coronavirus 2 by RT PCR NEGATIVE NEGATIVE Final    Comment: (NOTE) SARS-CoV-2 target nucleic acids are NOT DETECTED. The SARS-CoV-2 RNA is generally detectable in upper respiratoy specimens during the acute phase of infection. The lowest concentration of SARS-CoV-2 viral copies this assay can detect is 131 copies/mL. A negative result does not preclude SARS-Cov-2 infection and should not be used as the sole basis for treatment or other patient management decisions. A negative result may occur with  improper specimen collection/handling, submission  of specimen other than nasopharyngeal swab, presence of viral mutation(s) within the areas targeted by this assay, and inadequate number of viral copies (<131 copies/mL). A negative result must be combined with clinical observations, patient history, and epidemiological information. The expected result is Negative. Fact Sheet for Patients:  https://www.moore.com/ Fact Sheet for Healthcare Providers:  https://www.young.biz/ This test is not yet ap proved or cleared by the [place] FDA and  has been authorized for detection and/or diagnosis of SARS-CoV-2 by FDA under an Emergency Use Authorization (EUA). This EUA will remain  in effect (meaning this test can be used) for the duration of the COVID-19 declaration under Section 564(b)(1) of the Act, 21 U.S.C. section 360bbb-3(b)(1), unless the authorization is terminated or revoked sooner.    Influenza A by PCR NEGATIVE NEGATIVE Final   Influenza B by PCR NEGATIVE NEGATIVE Final    Comment: (NOTE) The Xpert Xpress SARS-CoV-2/FLU/RSV assay is intended as an aid in  the diagnosis of influenza from Nasopharyngeal swab specimens and  should not be used as a sole basis for treatment. Nasal washings and  aspirates are unacceptable for Xpert Xpress SARS-CoV-2/FLU/RSV  testing. Fact Sheet for Patients: https://www.moore.com/ Fact Sheet for Healthcare Providers: https://www.young.biz/ This test is not yet approved or cleared by the [place] FDA and  has been authorized for detection and/or diagnosis of SARS-CoV-2 by  FDA under an Emergency Use Authorization (EUA). This EUA will remain  in effect (meaning this test can be used) for the duration of the  Covid-19 declaration under Section 564(b)(1) of the Act, 21  U.S.C. section 360bbb-3(b)(1), unless the authorization is  terminated or revoked. Performed at [facility], [address], [place],  [place] [address]   Culture, blood (routine x 2)     Status: None (Preliminary result)   Collection Time: 02/11/20  9:26 PM   Specimen: BLOOD  Result Value Ref Range Status   Specimen Description BLOOD LEFT ANTECUBITAL  Final   Special Requests   Final    BOTTLES DRAWN AEROBIC AND ANAEROBIC Blood Culture adequate volume   Culture   Final    NO GROWTH 2 DAYS Performed at [facility], [address] [place], [place] [address]    Report Status PENDING  Incomplete  Culture, blood (routine x 2)     Status: None (Preliminary result)   Collection Time: 02/11/20  9:53 PM   Specimen: BLOOD RIGHT HAND  Result Value Ref Range Status   Specimen Description BLOOD RIGHT HAND  Final   Special Requests   Final    BOTTLES DRAWN AEROBIC ONLY Blood Culture adequate volume   Culture   Final    NO GROWTH 2 DAYS Performed at [facility], [address] [place], [place] [address]    Report Status PENDING  Incomplete    Radiology Reports DG Chest 2 View  Result Date: 02/11/2020 CLINICAL DATA:  Shortness of breath EXAM: CHEST - 2 VIEW COMPARISON:  January 23, 2020 FINDINGS: There is either scarring in the lateral right base or small right pleural effusion. There is apparent scarring in the right mid lung as well. Lungs elsewhere are clear. Heart size and pulmonary vascularity are normal. There is aortic atherosclerosis. No adenopathy. There is degenerative change in the thoracic spine. IMPRESSION: Scarring right mid lung. Question scarring lateral right base versus small right pleural effusion. Lungs elsewhere clear. Stable cardiac silhouette.  Aortic Atherosclerosis (ICD10-I70.0). Electronically Signed   By: Bretta BangWilliam  Woodruff III M.D.   On: 02/11/2020 11:40   DG Chest 2 View  Result Date: 01/21/2020 CLINICAL DATA:  Pneumonia 2 months ago. Dry cough. EXAM: CHEST - 2 VIEW COMPARISON:  Chest x-ray dated 12/25/2019. FINDINGS: Improved aeration at the RIGHT lung base/costophrenic angle. No new opacity.  Lungs are hyperexpanded. Chronic bronchitic changes noted centrally. Heart size and mediastinal contours are within normal limits. No acute or suspicious osseous finding. IMPRESSION: 1. No active cardiopulmonary disease. No evidence of pneumonia or pulmonary edema on today's exam. 2. Improved aeration at the RIGHT lung base compared to the previous chest x-ray of 12/25/2019. 3. Hyperexpanded lungs indicating COPD. Associated chronic bronchitic changes centrally. Electronically Signed   By: Bary RichardStan  Maynard M.D.   On: 01/21/2020 17:09   CT CHEST WO CONTRAST  Result Date: 02/12/2020 CLINICAL DATA:  Cancer of unknown primary. EXAM: CT CHEST WITHOUT CONTRAST TECHNIQUE: Multidetector CT imaging of the chest was performed following the standard protocol without IV contrast. COMPARISON:  January 23, 2020. FINDINGS: Cardiovascular: Atherosclerosis of thoracic aorta is noted without aneurysm formation. Normal cardiac size. No pericardial effusion. Coronary artery calcifications are noted. Mediastinum/Nodes: No enlarged mediastinal or axillary lymph nodes. Thyroid gland, trachea, and esophagus demonstrate no significant findings. Lungs/Pleura: No pneumothorax is noted. Minimal right pleural effusion is noted. Minimal bilateral posterior basilar subsegmental atelectasis is noted. Stable left apical scarring is noted. Stable right middle lobe scarring is noted. Stable right posterior upper lobe opacity is noted concerning for pneumonia or possibly scarring. Upper Abdomen: No acute abnormality. Musculoskeletal: No chest wall mass or suspicious bone lesions identified. IMPRESSION: 1. Coronary artery calcifications are noted suggesting coronary artery disease. 2. Minimal right pleural effusion is noted. 3. Stable right posterior upper lobe opacity is noted concerning for pneumonia or possibly scarring. Stable right middle lobe scarring is noted. 4. Stable left apical scarring is noted. 5. Minimal bilateral posterior basilar  subsegmental atelectasis is noted. Aortic Atherosclerosis (ICD10-I70.0). Electronically Signed   By: Lupita RaiderJames  Green Jr M.D.   On: 02/12/2020 11:46   CT Chest W Contrast  Result Date: 01/23/2020 CLINICAL DATA:  85 year old male with generalized weakness. EXAM: CT CHEST WITH CONTRAST TECHNIQUE: Multidetector CT imaging of the chest was performed  during intravenous contrast administration. CONTRAST:  75mL OMNIPAQUE IOHEXOL 300 MG/ML  SOLN COMPARISON:  Chest radiograph dated 01/23/2020. Chest CT dated 12/29/2019 FINDINGS: Cardiovascular: There is no cardiomegaly or pericardial effusion. Coronary vascular calcification primarily involving the LAD and left circumflex artery. There is moderate atherosclerotic calcification of the thoracic aorta. No aneurysmal dilatation or dissection. Evaluation of the pulmonary arteries is limited due to suboptimal opacification. No definite pulmonary artery embolus identified. Mediastinum/Nodes: There is no hilar or mediastinal adenopathy. Right hilar lymph node measures 7 mm in short axis. The esophagus is grossly unremarkable. No mediastinal fluid collection. Lungs/Pleura: There is a small right pleural effusion, decreased in size since the prior CT. Right upper lobe streaky and nodular densities most consistent with pneumonia. Overall improvement of the right upper lobe densities since the prior CT. Clinical correlation and continued follow-up recommended. Additional nodular densities in the left apex and left lower lobe. There is no pneumothorax. The central airways are patent. There is mild bronchial wall thickening most consistent with bronchitis. Biapical scarring noted. Upper Abdomen: Small gallstones. Musculoskeletal: Degenerative changes of the spine. No acute osseous pathology. IMPRESSION: 1. Right upper lobe pneumonia, improved since the prior CT. Clinical correlation and continued follow-up recommended. 2. Small right pleural effusion, decreased in size since the prior CT.  3. Mild bronchial wall thickening most consistent with bronchitis. 4. Cholelithiasis. 5. Aortic Atherosclerosis (ICD10-I70.0). Electronically Signed   By: Elgie Collard M.D.   On: 01/23/2020 17:23   MR BRAIN WO CONTRAST  Result Date: 02/11/2020 CLINICAL DATA:  Subacute neuro deficit EXAM: MRI HEAD WITHOUT CONTRAST TECHNIQUE: Multiplanar, multiecho pulse sequences of the brain and surrounding structures were obtained without intravenous contrast. COMPARISON:  02/17/2009 FINDINGS: Brain: No acute infarction, hemorrhage, hydrocephalus, extra-axial collection or mass lesion. Cerebral volume loss and ventriculomegaly that is progressed from 2010. No significant ischemic changes. Vascular: Normal flow voids Skull and upper cervical spine: Normal marrow signal Sinuses/Orbits: Marked atrophy of the right optic nerve compared to the left, see coronal reformats. There is history of blindness on the right per the chart. Bilateral mastoid opacification. Left sphenoid sinus opacification. IMPRESSION: 1. No acute or reversible finding. 2. Brain atrophy that has progressed from 2010. 3. Left sphenoid sinus and bilateral mastoid opacification with negative nasopharynx. Electronically Signed   By: Marnee Spring M.D.   On: 02/11/2020 19:17   CT Abdomen Pelvis W Contrast  Result Date: 02/11/2020 CLINICAL DATA:  Nausea and vomiting. Weakness. Recent pneumonia EXAM: CT ABDOMEN AND PELVIS WITH CONTRAST TECHNIQUE: Multidetector CT imaging of the abdomen and pelvis was performed using the standard protocol following bolus administration of intravenous contrast. CONTRAST:  OMNIPAQUE IOHEXOL 300 MG/ML  SOLN COMPARISON:  None. FINDINGS: Lower Chest: No acute findings. Tiny pleural effusions noted bilaterally. Hepatobiliary: No hepatic masses identified. A few tiny sub-cm calcified gallstones are seen, however there is no evidence of cholecystitis or biliary dilatation. Pancreas:  No mass or inflammatory changes. Spleen:  Within normal limits in size and appearance. Adrenals/Urinary Tract: No masses identified. No evidence of ureteral calculi or hydronephrosis. Stomach/Bowel: No evidence of obstruction, inflammatory process or abnormal fluid collections. Normal appendix visualized. Diverticulosis is seen mainly involving the sigmoid colon, however there is no evidence of diverticulitis. Vascular/Lymphatic: No pathologically enlarged lymph nodes. No abdominal aortic aneurysm. Aortic atherosclerosis noted. Reproductive:  No mass or other significant abnormality. Other:  None. Musculoskeletal:  No suspicious bone lesions identified. IMPRESSION: 1. Colonic diverticulosis, without radiographic evidence of diverticulitis or other acute findings. 2. Cholelithiasis. No  radiographic evidence of cholecystitis. 3. Tiny bilateral pleural effusions. Aortic Atherosclerosis (ICD10-I70.0). Electronically Signed   By: Danae Orleans M.D.   On: 02/11/2020 14:08   DG Chest Portable 1 View  Result Date: 01/23/2020 CLINICAL DATA:  Progressive weakness. Recent history of pneumonia. EXAM: PORTABLE CHEST 1 VIEW COMPARISON:  Chest x-ray dated 01/19/2020 and chest CT dated 12/29/2019 FINDINGS: The heart size and pulmonary vascularity are normal. Aortic atherosclerosis. Chronic blunting of the right costophrenic angle laterally. Chronic slight parenchymal scarring in the right midzone. Left lung is clear. No acute bone abnormality. Moderate arthritic changes of both glenohumeral joints. IMPRESSION: 1. No acute abnormalities. 2.  Aortic Atherosclerosis (ICD10-I70.0). Electronically Signed   By: Francene Boyers M.D.   On: 01/23/2020 13:45    Time Spent in minutes  30   Susa Raring M.D on 02/13/2020 at 10:29 AM  To page go to [email or web address] - password [facility]

## 2020-02-13 NOTE — Progress Notes (Signed)
HEMATOLOGY-ONCOLOGY PROGRESS NOTE  SUBJECTIVE: The patient is resting in bed at the time my visit.  His wife is at the bedside.  She states that he seems more weak today and is sleeping a lot.  He is still reporting dyspnea with exertion.  He was up to the recliner for a little while earlier today.  Still has lower extremity edema and now has compression stockings on.  His wife thinks that he is less confused today.  No fevers noted.  REVIEW OF SYSTEMS:   Noncontributory except as noted in the HPI.  I have reviewed the past medical history, past surgical history, social history and family history with the patient and they are unchanged from previous note.   PHYSICAL EXAMINATION: ECOG PERFORMANCE STATUS: 2 - Symptomatic, <50% confined to bed  Vitals:   02/13/20 0406 02/13/20 0758  BP: 128/63 133/68  Pulse: (!) 105 92  Resp: 18 20  Temp: 98.6 F (37 C)   SpO2: 96% 98%   Filed Weights   02/11/20 1142 02/11/20 2036 02/12/20 2055  Weight: 97.5 kg 95.7 kg 98.1 kg    Intake/Output from previous day: 05/10 0701 - 05/11 0700 In: 720 [P.O.:720] Out: 1601 [Urine:1600; Stool:1]  GENERAL: Elderly male, no distress SKIN: skin color, texture, turgor are normal, no rashes or significant lesions LUNGS: clear to auscultation and percussion with normal breathing effort HEART: regular rate & rhythm and no murmurs, trace edema with compression stockings in place ABDOMEN:abdomen soft, non-tender and normal bowel sounds NEURO: alert & oriented x 3 with fluent speech, no focal motor/sensory deficits  LABORATORY DATA:  I have reviewed the data as listed CMP Latest Ref Rng & Units 02/13/2020 02/12/2020 02/11/2020  Glucose 70 - 99 mg/dL 672(C) 947(S) 962(E)  BUN 8 - 23 mg/dL 23 20 20   Creatinine 0.61 - 1.24 mg/dL ) 3.66(Q) 9.47(M  Sodium 135 - 145 mmol/L 128(L) 128(L) 129(L)  Potassium 3.5 - 5.1 mmol/L 3.6 3.9 4.2  Chloride 98 - 111 mmol/L 94(L) 93(L) 95(L)  CO2 22 - 32 mmol/L 23 24 26   Calcium  8.9 - 10.3 mg/dL 7.5(L) 7.9(L) 7.9(L)  Total Protein 6.5 - 8.1 g/dL 5.9(L) - 6.6  Total Bilirubin 0.3 - 1.2 mg/dL 0.8 - 0.6  Alkaline Phos 38 - 126 U/L 184(H) - 190(H)  AST 15 - 41 U/L 37 - 25  ALT 0 - 44 U/L 25 - 24    Lab Results  Component Value Date   WBC 21.2 (H) 02/13/2020   HGB 7.7 (L) 02/13/2020   HCT 24.8 (L) 02/13/2020   MCV 85.5 02/13/2020   PLT 422 (H) 02/13/2020   NEUTROABS 16.6 (H) 02/13/2020    DG Chest 2 View  Result Date: 02/11/2020 CLINICAL DATA:  Shortness of breath EXAM: CHEST - 2 VIEW COMPARISON:  January 23, 2020 FINDINGS: There is either scarring in the lateral right base or small right pleural effusion. There is apparent scarring in the right mid lung as well. Lungs elsewhere are clear. Heart size and pulmonary vascularity are normal. There is aortic atherosclerosis. No adenopathy. There is degenerative change in the thoracic spine. IMPRESSION: Scarring right mid lung. Question scarring lateral right base versus small right pleural effusion. Lungs elsewhere clear. Stable cardiac silhouette.  Aortic Atherosclerosis (ICD10-I70.0). Electronically Signed   By: 04/12/2020 III M.D.   On: 02/11/2020 11:40   DG Chest 2 View  Result Date: 01/21/2020 CLINICAL DATA:  Pneumonia 2 months ago. Dry cough. EXAM: CHEST - 2 VIEW COMPARISON:  Chest x-ray dated 12/25/2019. FINDINGS: Improved aeration at the RIGHT lung base/costophrenic angle. No new opacity. Lungs are hyperexpanded. Chronic bronchitic changes noted centrally. Heart size and mediastinal contours are within normal limits. No acute or suspicious osseous finding. IMPRESSION: 1. No active cardiopulmonary disease. No evidence of pneumonia or pulmonary edema on today's exam. 2. Improved aeration at the RIGHT lung base compared to the previous chest x-ray of 12/25/2019. 3. Hyperexpanded lungs indicating COPD. Associated chronic bronchitic changes centrally. Electronically Signed   By: Bary Richard M.D.   On: 01/21/2020 17:09    CT CHEST WO CONTRAST  Result Date: 02/12/2020 CLINICAL DATA:  Cancer of unknown primary. EXAM: CT CHEST WITHOUT CONTRAST TECHNIQUE: Multidetector CT imaging of the chest was performed following the standard protocol without IV contrast. COMPARISON:  January 23, 2020. FINDINGS: Cardiovascular: Atherosclerosis of thoracic aorta is noted without aneurysm formation. Normal cardiac size. No pericardial effusion. Coronary artery calcifications are noted. Mediastinum/Nodes: No enlarged mediastinal or axillary lymph nodes. Thyroid gland, trachea, and esophagus demonstrate no significant findings. Lungs/Pleura: No pneumothorax is noted. Minimal right pleural effusion is noted. Minimal bilateral posterior basilar subsegmental atelectasis is noted. Stable left apical scarring is noted. Stable right middle lobe scarring is noted. Stable right posterior upper lobe opacity is noted concerning for pneumonia or possibly scarring. Upper Abdomen: No acute abnormality. Musculoskeletal: No chest wall mass or suspicious bone lesions identified. IMPRESSION: 1. Coronary artery calcifications are noted suggesting coronary artery disease. 2. Minimal right pleural effusion is noted. 3. Stable right posterior upper lobe opacity is noted concerning for pneumonia or possibly scarring. Stable right middle lobe scarring is noted. 4. Stable left apical scarring is noted. 5. Minimal bilateral posterior basilar subsegmental atelectasis is noted. Aortic Atherosclerosis (ICD10-I70.0). Electronically Signed   By: Lupita Raider M.D.   On: 02/12/2020 11:46   CT Chest W Contrast  Result Date: 01/23/2020 CLINICAL DATA:  85 year old male with generalized weakness. EXAM: CT CHEST WITH CONTRAST TECHNIQUE: Multidetector CT imaging of the chest was performed during intravenous contrast administration. CONTRAST:  54mL OMNIPAQUE IOHEXOL 300 MG/ML  SOLN COMPARISON:  Chest radiograph dated 01/23/2020. Chest CT dated 12/29/2019 FINDINGS: Cardiovascular:  There is no cardiomegaly or pericardial effusion. Coronary vascular calcification primarily involving the LAD and left circumflex artery. There is moderate atherosclerotic calcification of the thoracic aorta. No aneurysmal dilatation or dissection. Evaluation of the pulmonary arteries is limited due to suboptimal opacification. No definite pulmonary artery embolus identified. Mediastinum/Nodes: There is no hilar or mediastinal adenopathy. Right hilar lymph node measures 7 mm in short axis. The esophagus is grossly unremarkable. No mediastinal fluid collection. Lungs/Pleura: There is a small right pleural effusion, decreased in size since the prior CT. Right upper lobe streaky and nodular densities most consistent with pneumonia. Overall improvement of the right upper lobe densities since the prior CT. Clinical correlation and continued follow-up recommended. Additional nodular densities in the left apex and left lower lobe. There is no pneumothorax. The central airways are patent. There is mild bronchial wall thickening most consistent with bronchitis. Biapical scarring noted. Upper Abdomen: Small gallstones. Musculoskeletal: Degenerative changes of the spine. No acute osseous pathology. IMPRESSION: 1. Right upper lobe pneumonia, improved since the prior CT. Clinical correlation and continued follow-up recommended. 2. Small right pleural effusion, decreased in size since the prior CT. 3. Mild bronchial wall thickening most consistent with bronchitis. 4. Cholelithiasis. 5. Aortic Atherosclerosis (ICD10-I70.0). Electronically Signed   By: Elgie Collard M.D.   On: 01/23/2020 17:23   MR BRAIN  WO CONTRAST  Result Date: 02/11/2020 CLINICAL DATA:  Subacute neuro deficit EXAM: MRI HEAD WITHOUT CONTRAST TECHNIQUE: Multiplanar, multiecho pulse sequences of the brain and surrounding structures were obtained without intravenous contrast. COMPARISON:  02/17/2009 FINDINGS: Brain: No acute infarction, hemorrhage,  hydrocephalus, extra-axial collection or mass lesion. Cerebral volume loss and ventriculomegaly that is progressed from 2010. No significant ischemic changes. Vascular: Normal flow voids Skull and upper cervical spine: Normal marrow signal Sinuses/Orbits: Marked atrophy of the right optic nerve compared to the left, see coronal reformats. There is history of blindness on the right per the chart. Bilateral mastoid opacification. Left sphenoid sinus opacification. IMPRESSION: 1. No acute or reversible finding. 2. Brain atrophy that has progressed from 2010. 3. Left sphenoid sinus and bilateral mastoid opacification with negative nasopharynx. Electronically Signed   By: Marnee Spring M.D.   On: 02/11/2020 19:17   CT Abdomen Pelvis W Contrast  Result Date: 02/11/2020 CLINICAL DATA:  Nausea and vomiting. Weakness. Recent pneumonia EXAM: CT ABDOMEN AND PELVIS WITH CONTRAST TECHNIQUE: Multidetector CT imaging of the abdomen and pelvis was performed using the standard protocol following bolus administration of intravenous contrast. CONTRAST:  OMNIPAQUE IOHEXOL 300 MG/ML  SOLN COMPARISON:  None. FINDINGS: Lower Chest: No acute findings. Tiny pleural effusions noted bilaterally. Hepatobiliary: No hepatic masses identified. A few tiny sub-cm calcified gallstones are seen, however there is no evidence of cholecystitis or biliary dilatation. Pancreas:  No mass or inflammatory changes. Spleen: Within normal limits in size and appearance. Adrenals/Urinary Tract: No masses identified. No evidence of ureteral calculi or hydronephrosis. Stomach/Bowel: No evidence of obstruction, inflammatory process or abnormal fluid collections. Normal appendix visualized. Diverticulosis is seen mainly involving the sigmoid colon, however there is no evidence of diverticulitis. Vascular/Lymphatic: No pathologically enlarged lymph nodes. No abdominal aortic aneurysm. Aortic atherosclerosis noted. Reproductive:  No mass or other significant  abnormality. Other:  None. Musculoskeletal:  No suspicious bone lesions identified. IMPRESSION: 1. Colonic diverticulosis, without radiographic evidence of diverticulitis or other acute findings. 2. Cholelithiasis. No radiographic evidence of cholecystitis. 3. Tiny bilateral pleural effusions. Aortic Atherosclerosis (ICD10-I70.0). Electronically Signed   By: Danae [place] M.D.   On: 02/11/2020 14:08   DG Chest Portable 1 View  Result Date: 01/23/2020 CLINICAL DATA:  Progressive weakness. Recent history of pneumonia. EXAM: PORTABLE CHEST 1 VIEW COMPARISON:  Chest x-ray dated 01/19/2020 and chest CT dated 12/29/2019 FINDINGS: The heart size and pulmonary vascularity are normal. Aortic atherosclerosis. Chronic blunting of the right costophrenic angle laterally. Chronic slight parenchymal scarring in the right midzone. Left lung is clear. No acute bone abnormality. Moderate arthritic changes of both glenohumeral joints. IMPRESSION: 1. No acute abnormalities. 2.  Aortic Atherosclerosis (ICD10-I70.0). Electronically Signed   By: Francene Boyers M.D.   On: 01/23/2020 13:45   ASSESSMENT: 85 y.o. [place], [place] male with persistent leukocytosis and anemia.  He has failure to thrive with progressive physical decline as well as anorexia and early satiety.  PLAN:  Previous review of his peripheral blood smear showed no evidence of a leukemia or primary bone marrow process.  Findings most consistent with a reactive process such as persistent infection or inflammation.  Recommend continued treatment of his pneumonia with a minimum of 10 more days of antibiotics.  Recommend PRBC transfusion to maintain a hemoglobin of 9.8-10.2.  His hemoglobin is 7.7 today.  I have ordered 1 unit of PRBCs.  I have discussed the risks and benefits of blood transfusion with the patient and his wife and they are  agreeable to proceed.  Orders have been entered.  Recommend monitoring CBC daily.   LOS: 2 days   Mikey Bussing,  DNP, AGPCNP-BC, AOCNP 02/13/20

## 2020-02-14 ENCOUNTER — Ambulatory Visit (INDEPENDENT_AMBULATORY_CARE_PROVIDER_SITE_OTHER): Payer: Medicare Other | Admitting: Internal Medicine

## 2020-02-14 LAB — CBC WITH DIFFERENTIAL/PLATELET
Abs Immature Granulocytes: 0.3 10*3/uL — ABNORMAL HIGH (ref 0.00–0.07)
Basophils Absolute: 0.1 10*3/uL (ref 0.0–0.1)
Basophils Relative: 0 %
Eosinophils Absolute: 0.8 10*3/uL — ABNORMAL HIGH (ref 0.0–0.5)
Eosinophils Relative: 3 %
HCT: 30.2 % — ABNORMAL LOW (ref 39.0–52.0)
Hemoglobin: 9.5 g/dL — ABNORMAL LOW (ref 13.0–17.0)
Immature Granulocytes: 1 %
Lymphocytes Relative: 12 %
Lymphs Abs: 2.8 10*3/uL (ref 0.7–4.0)
MCH: 27 pg (ref 26.0–34.0)
MCHC: 31.5 g/dL (ref 30.0–36.0)
MCV: 85.8 fL (ref 80.0–100.0)
Monocytes Absolute: 1.4 10*3/uL — ABNORMAL HIGH (ref 0.1–1.0)
Monocytes Relative: 6 %
Neutro Abs: 18.4 10*3/uL — ABNORMAL HIGH (ref 1.7–7.7)
Neutrophils Relative %: 78 %
Platelets: 428 10*3/uL — ABNORMAL HIGH (ref 150–400)
RBC: 3.52 MIL/uL — ABNORMAL LOW (ref 4.22–5.81)
RDW: 15.2 % (ref 11.5–15.5)
WBC: 23.8 10*3/uL — ABNORMAL HIGH (ref 4.0–10.5)
nRBC: 0 % (ref 0.0–0.2)

## 2020-02-14 LAB — COMPREHENSIVE METABOLIC PANEL
ALT: 38 U/L (ref 0–44)
AST: 54 U/L — ABNORMAL HIGH (ref 15–41)
Albumin: 1.2 g/dL — ABNORMAL LOW (ref 3.5–5.0)
Alkaline Phosphatase: 174 U/L — ABNORMAL HIGH (ref 38–126)
Anion gap: 10 (ref 5–15)
BUN: 26 mg/dL — ABNORMAL HIGH (ref 8–23)
CO2: 24 mmol/L (ref 22–32)
Calcium: 7.5 mg/dL — ABNORMAL LOW (ref 8.9–10.3)
Chloride: 93 mmol/L — ABNORMAL LOW (ref 98–111)
Creatinine, Ser: 1.47 mg/dL — ABNORMAL HIGH (ref 0.61–1.24)
GFR calc Af Amer: 51 mL/min — ABNORMAL LOW (ref >60)
GFR calc non Af Amer: 44 mL/min — ABNORMAL LOW (ref >60)
Glucose, Bld: 125 mg/dL — ABNORMAL HIGH (ref 70–99)
Potassium: 4 mmol/L (ref 3.5–5.1)
Sodium: 127 mmol/L — ABNORMAL LOW (ref 135–145)
Total Bilirubin: 0.7 mg/dL (ref 0.3–1.2)
Total Protein: 6.3 g/dL — ABNORMAL LOW (ref 6.5–8.1)

## 2020-02-14 LAB — TYPE AND SCREEN
ABO/RH(D): A POS
Antibody Screen: NEGATIVE
Unit division: 0

## 2020-02-14 LAB — MAGNESIUM: Magnesium: 1.8 mg/dL (ref 1.7–2.4)

## 2020-02-14 LAB — BPAM RBC
Blood Product Expiration Date: 202106052359
ISSUE DATE / TIME: 202105111807
Unit Type and Rh: 6200

## 2020-02-14 LAB — BRAIN NATRIURETIC PEPTIDE: B Natriuretic Peptide: 198.6 pg/mL — ABNORMAL HIGH (ref 0.0–100.0)

## 2020-02-14 LAB — CANCER ANTIGEN 19-9: CA 19-9: 13 U/mL (ref 0–35)

## 2020-02-14 LAB — CEA: CEA: 2.7 ng/mL (ref 0.0–4.7)

## 2020-02-14 LAB — C-REACTIVE PROTEIN: CRP: 18.5 mg/dL — ABNORMAL HIGH (ref <1.0)

## 2020-02-14 LAB — PROCALCITONIN: Procalcitonin: 0.54 ng/mL

## 2020-02-14 MED ORDER — FUROSEMIDE 40 MG PO TABS
40.0000 mg | ORAL_TABLET | Freq: Once | ORAL | Status: AC
  Administered 2020-02-14: 40 mg via ORAL
  Filled 2020-02-14: qty 1

## 2020-02-14 MED ORDER — SALINE SPRAY 0.65 % NA SOLN
1.0000 | NASAL | Status: DC | PRN
  Administered 2020-02-14 – 2020-02-15 (×3): 1 via NASAL
  Filled 2020-02-14: qty 44

## 2020-02-14 MED ORDER — LACTATED RINGERS IV SOLN: INTRAVENOUS | Status: AC

## 2020-02-14 MED ORDER — ZOLPIDEM TARTRATE 5 MG PO TABS
2.5000 mg | ORAL_TABLET | Freq: Every evening | ORAL | Status: DC | PRN
  Administered 2020-02-14 – 2020-02-15 (×2): 2.5 mg via ORAL
  Filled 2020-02-14 (×2): qty 1

## 2020-02-14 MED ORDER — CEPASTAT 14.5 MG MT LOZG
1.0000 | LOZENGE | OROMUCOSAL | Status: DC | PRN
  Administered 2020-02-14: 1 via BUCCAL
  Filled 2020-02-14 (×3): qty 9

## 2020-02-14 NOTE — Evaluation (Signed)
Clinical/Bedside Swallow Evaluation Patient Details  Name: Jeffrey Ho MRN: [id number] Date of Birth: Nov 23, 1934  Today's Date: 02/14/2020 Time: SLP Start Time (ACUTE ONLY): 1355 SLP Stop Time (ACUTE ONLY): 1412 SLP Time Calculation (min) (ACUTE ONLY): 17 min  Past Medical History:  Past Medical History:  Diagnosis Date  . Blindness of right eye   . Carotid artery occlusion   . HOH (hard of hearing)   . Hyperlipidemia   . Hypertension    Past Surgical History:  Past Surgical History:  Procedure Laterality Date  . BIOPSY  01/26/2020   Procedure: BIOPSY;  Surgeon: Corbin Ade, MD;  [facility]: [facility];  Service: Endoscopy;;  gastric biopsies for H. Pylori  . CAROTID ENDARTERECTOMY     left CEA  06/24.2010  . CAROTID ENDARTERECTOMY     right CEA  02/20/2009  . COLONOSCOPY WITH PROPOFOL N/A 01/26/2020   Procedure: COLONOSCOPY WITH PROPOFOL;  Surgeon: Corbin Ade, MD;  Location: [facility];  Service: Endoscopy;  Laterality: N/A;  . ESOPHAGOGASTRODUODENOSCOPY (EGD) WITH PROPOFOL N/A 01/26/2020   Procedure: ESOPHAGOGASTRODUODENOSCOPY (EGD) WITH PROPOFOL;  Surgeon: Corbin Ade, MD;  Location: [facility];  Service: Endoscopy;  Laterality: N/A;  . EYE SURGERY Left Nov. 2015   Cataract  . POLYPECTOMY  01/26/2020   Procedure: POLYPECTOMY;  Surgeon: Corbin Ade, MD;  Location: [facility];  Service: Endoscopy;;  Splenic Flexure polyp    HPI:  85 y.o. Jeffrey Ho, Jeffrey Ho male with persistent leukocytosis and anemia.  He has failure to thrive with progressive physical decline as well as anorexia and early satiety.   Assessment / Plan / Recommendation Clinical Impression  Pt demonstrates no interest in food, reports dry mouth, that food just turns to glue in his mouth with no flavor. There were some instances of appearance of ill timed swallow, particularly during the 3 oz water test when pt struggled to coordinate breathing and swallowing. However, no  coughing or significant signs of aspiration occurred. We reviewed helpul strategies such as eating small snacks through the day, following bites with sips of ensure or breeze. Reported concerns about dry mouth to MD. Will downgrade diet to dys 2 to at least one day to see if softer foods facilitate intake (though suspect it will make food less palatable).   SLP Visit Diagnosis: Dysphagia, oral phase (R13.11)    Aspiration Risk  Mild aspiration risk;Risk for inadequate nutrition/hydration    Diet Recommendation Dysphagia 2 (Fine chop);Thin liquid   Liquid Administration via: Cup;Straw Medication Administration: Whole meds with puree Supervision: Patient able to self feed Compensations: Slow rate;Small sips/bites;Follow solids with liquid Postural Changes: Seated upright at 90 degrees    Other  Recommendations     Follow up Recommendations        Frequency and Duration min 2x/week  2 weeks       Prognosis        Swallow Study   General HPI: 85 y.o. Jeffrey Ho, Jeffrey Ho male with persistent leukocytosis and anemia.  He has failure to thrive with progressive physical decline as well as anorexia and early satiety. Type of Study: Bedside Swallow Evaluation Previous Swallow Assessment: none Diet Prior to this Study: Regular;Thin liquids Temperature Spikes Noted: No Respiratory Status: Nasal cannula History of Recent Intubation: No Behavior/Cognition: Alert Oral Cavity Assessment: Dry Oral Care Completed by SLP: No Oral Cavity - Dentition: Adequate natural dentition Vision: Functional for self-feeding Self-Feeding Abilities: Able to feed self Patient Positioning: Upright in bed  Baseline Vocal Quality: Normal Volitional Cough: Strong Volitional Swallow: Able to elicit    Oral/Motor/Sensory Function Overall Oral Motor/Sensory Function: Within functional limits   Ice Chips     Thin Liquid Thin Liquid: Impaired Presentation: Straw Pharyngeal  Phase Impairments: Suspected  delayed Swallow    Nectar Thick Nectar Thick Liquid: Not tested   Honey Thick Honey Thick Liquid: Not tested   Puree Puree: Within functional limits Presentation: Spoon   Solid     Solid: Within functional limits      Tamu Golz Roig, Katherene Ponto 02/14/2020,2:46 PM

## 2020-02-14 NOTE — Plan of Care (Signed)
  Problem: Activity: Goal: Risk for activity intolerance will decrease Outcome: Progressing   Problem: Nutrition: Goal: Adequate nutrition will be maintained Outcome: Progressing   

## 2020-02-14 NOTE — Consult Note (Signed)
   [facility] CM Inpatient Consult   02/14/2020  Olie E Petrilla 01/18/1935 [id number]  [facility] ACO Patient:  [employer] Medicare  Patient screened for high risk score for unplanned readmission score and for less than 30 days readmission hospitalization.  Reviewed to check if potential [facility] Care Management services are needed.  Review of patient's medical record reveals patient is admitted with Failure to Thrive with Generalized Weakness with noted Leukocytosis and Anemia noted.  The patient is being recommended for a skilled nursing facility level of care. Community review reveals his Primary Care Provider is  Fusco, Lawrence MD, [facility] and this office follows for transition of care. Review also reveals patient has had both of his [employer] COVID vaccines [10/27/19 and 11/17/19] noted as well in EMR review in community.  Wife is POA noted.  Plan:  If patient transitions to a skilled nursing facility his post hospital transition of care needs will be met at that level of care. No current [facility] Care Management follow up planned at this time, if goes to SNF.  For questions contact:   Victoria Brewer, RN BSN CCM [facility] Hospital Liaison  [phone] business mobile phone Toll free office [phone]  Fax number: [phone] [email or web address]       

## 2020-02-14 NOTE — Progress Notes (Signed)
PROGRESS NOTE                                                                                                                                                                                                             Patient Demographics:    Jeffrey Ho, is a 85 y.o. male, DOB - [birth year], WUJ:[id number]  Admit date - 02/11/2020   Admitting Physician Jonah Blue, MD  Outpatient Primary MD for the patient is [name], MD  LOS - 3  Chief Complaint  Patient presents with  . Weakness       Brief Narrative - Jeffrey Ho is a 85 y.o. male with medical history significant of R eye blindness; carotid artery occlusion; HTN; and HLD.  He was admitted from 3/28-30 for CAP with demand ischemia; he was discharged on Augmentin.  During that admission, he had heme positive stools but had stable Hgb and no overt bleeding and so was planned for outpatient GI f/u.  He returned from 4/20-23 with symptomatic anemia and hyponatremia.  EGD/colonoscopy performed with reflux erosive esophagitis and diverticulosis/polyps, he was subsequently discharged home presented back to the hospital again on 02/11/2020 with generalized weakness, worsening anemia, hyponatremia and excessive fatigue and was admitted to the hospital for further work-up.   Subjective:   Patient in bed appears to be in no distress denies any headache chest or abdominal pain.  No shortness of breath no focal weakness.   Assessment  & Plan :     1.  Failure to thrive, persistent fatigue and deconditioning with symptomatic recurrent anemia of chronic disease and leukocytosis. he now appears to be hemodynamically stable, seen by hematology oncology, CT chest abdomen pelvis nonacute with no suspicion for malignancy.  His decline seems to be age-related although there could be a small possibility of a lingering infection for which we will challenge him with 7 days of azithromycin and Flagyl since he has history of pneumonia  and diverticulosis.  He has no abdominal pain or productive cough.  Of note he had recent EGD and colonoscopy in [place] by Dr. [name] which was nonacute as well.  He received 1 unit of packed RBC by hematology on 02/13/2020 after which his H&H appears to be stable.  No signs of ongoing bleeding.   2.  Deconditioning.  PT OT, may require placement.  MRI brain nonacute but does show atrophy.  3.  Hyponatremia with fluid overload and edema.  Likely due to third spacing of fluids due to  severe protein calorie malnutrition, recent echo noted with preserved EF and mild diastolic dysfunction, no pulmonary edema hence I do not think this is CHF.  Unfortunately with Lasix renal function started to decline so off the second dose will hold.  Protein supplementations being provided.  4.  Severe PCM causing third spacing of fluid and edema.  Has been placed on protein supplements, LFTs appear stable with stable bilirubin, UA has mild proteinuria.  Outpatient nephrology follow-up would suffice.  5.  AKI.  This is likely due to contrast nephropathy he received IV contrast for his CT abdomen and pelvis, proved after gentle hydration on 02/13/2020, slightly worse again will hydrate gently on 02/14/2020.  Extremely difficult situation as he has third space due to severe protein calorie malnutrition and is hyponatremic due to it but not tolerating diuresis at all.  6.  Dyslipidemia continue home dose statin.  7.  HX of pneumonia with lung scarring.  CT noncontrast stable see #1 above.  8.  Diverticulosis.  Stable.  Nonacute CT abdomen pelvis.  See #1 above and monitor.  No pain or diarrhea.    Condition - Fair  Family Communication  :   Message left for wife Jeffrey Ho on [phone] on 02/12/2020 at 10:45 AM.   Wife updated bedside 02/13/20, 02/14/20   Code Status :  DNR  Consults  :  Onc  Disposition Plan  :    Status is: Inpatient  Remains inpatient appropriate because:Ongoing diagnostic testing  needed not appropriate for outpatient work up   Dispo: The patient is from: Home              Anticipated d/c is to: SNF              Anticipated d/c date is: 1-2 days              Patient currently is not medically stable to d/c.  Had recurrent admissions for failure to thrive and fatigue, hematological work-up is being done to rule out underlying malignancy.   Procedures  :    CT Chest - Non acute  CT abdomen and pelvis with contrast -  1. Colonic diverticulosis, without radiographic evidence of diverticulitis or other acute findings. 2. Cholelithiasis. No radiographic evidence of cholecystitis. 3. Tiny bilateral pleural effusions. Aortic Atherosclerosis  MRI brain - Non acute   PUD Prophylaxis : PPI  DVT Prophylaxis  :  Lovenox   Lab Results  Component Value Date   PLT 428 (H) 02/14/2020    Diet :  Diet Order            Diet Heart Room service appropriate? Yes; Fluid consistency: Thin  Diet effective now               Inpatient Medications Scheduled Meds: . sodium chloride   Intravenous Once  . atorvastatin  20 mg Oral Daily  . azithromycin  250 mg Oral Daily  . docusate sodium  100 mg Oral BID  . enoxaparin (LOVENOX) injection  40 mg Subcutaneous Q24H  . feeding supplement (ENSURE ENLIVE)  237 mL Oral TID BM  . feeding supplement (PRO-STAT SUGAR FREE 64)  30 mL Oral TID WC  . ferrous sulfate  325 mg Oral BID WC  . folic acid  1 mg Oral Daily  . pantoprazole  40 mg Oral Daily   Continuous Infusions: . lactated ringers    . metronidazole 500 mg (02/14/20 0823)   PRN Meds:.acetaminophen **OR** [DISCONTINUED] acetaminophen, bisacodyl, hydrALAZINE, [DISCONTINUED] ondansetron **  OR** ondansetron (ZOFRAN) IV, polyethylene glycol  Antibiotics  :   Anti-infectives (From admission, onward)   Start     Dose/Rate Route Frequency Ordered Stop   02/13/20 1000  azithromycin (ZITHROMAX) tablet 250 mg     250 mg Oral Daily 02/13/20 0811 02/20/20 0959   02/13/20 0900   metroNIDAZOLE (FLAGYL) IVPB 500 mg     500 mg 100 mL/hr over 60 Minutes Intravenous Every 8 hours 02/13/20 0811 02/20/20 0859          Objective:   Vitals:   02/13/20 1825 02/13/20 2055 02/14/20 0432 02/14/20 0855  BP: (!) 134/57 124/64 (!) 143/75 115/74  Pulse: 94 97 (!) 102 (!) 104  Resp: 18 20  20   Temp: (!) 97.5 F (36.4 C) 98.3 F (36.8 C) 97.7 F (36.5 C) (!) 97.5 F (36.4 C)  TempSrc: Oral Oral Oral Axillary  SpO2: 100% 98% 100% 97%  Weight:      Height:        SpO2: 97 %  Wt Readings from Last 3 Encounters:  02/12/20 98.1 kg  01/23/20 97.5 kg  01/01/20 102 kg     Intake/Output Summary (Last 24 hours) at 02/14/2020 0930 Last data filed at 02/14/2020 0900 Gross per 24 hour  Intake 820 ml  Output 2100 ml  Net -1280 ml     Physical Exam  Awake Alert, No new F.N deficits, bilateral hearing loss, 1+ edema with TED stockings in place, [place].AT,PERRAL Supple Neck,No JVD, No cervical lymphadenopathy appriciated.  Symmetrical Chest wall movement, Good air movement bilaterally, CTAB RRR,No Gallops, Rubs or new Murmurs, No Parasternal Heave +ve B.Sounds, Abd Soft, No tenderness, No organomegaly appriciated, No rebound - guarding or rigidity. No Cyanosis,       Data Review:    Recent Labs  Lab 02/11/20 1222 02/12/20 0357 02/13/20 0317 02/13/20 2227 02/14/20 0617  WBC 18.5* 23.4* 21.2*  --  23.8*  HGB 8.0* 8.4* 7.7* 8.1* 9.5*  HCT 26.6* 27.4* 24.8* 25.2* 30.2*  PLT 421* 493* 422*  --  428*  MCV 88.4 87.5 85.5  --  85.8  MCH 26.6 26.8 26.6  --  27.0  MCHC 30.1 30.7 31.0  --  31.5  RDW 15.2 15.4 15.3  --  15.2  LYMPHSABS 2.4  --  2.5  --  2.8  MONOABS 1.2*  --  1.5*  --  1.4*  EOSABS 0.4  --  0.3  --  0.8*  BASOSABS 0.1  --  0.1  --  0.1    Recent Labs  Lab 02/11/20 1222 02/12/20 0357 02/12/20 0739 02/13/20 0317 02/14/20 0617  NA 129* 128*  --  128* 127*  K 4.2 3.9  --  3.6 4.0  CL 95* 93*  --  94* 93*  CO2 26 24  --  23 24  GLUCOSE 108*  110*  --  114* 125*  BUN 20 20  --  23 26*  CREATININE 1.23 1.43*  --  1.28* 1.47*  CALCIUM 7.9* 7.9*  --  7.5* 7.5*  AST 25  --   --  37 54*  ALT 24  --   --  25 38  ALKPHOS 190*  --   --  184* 174*  BILITOT 0.6  --   --  0.8 0.7  ALBUMIN 1.4*  --   --  1.2* 1.2*  MG  --   --   --  1.7 1.8  CRP  --   --   --  19.9* 18.5*  PROCALCITON  --   --  0.53 0.58 0.54  TSH  --   --  2.638  --   --   BNP 191.8*  --   --  159.4* 198.6*    Recent Labs  Lab 02/11/20 1222 02/11/20 1601 02/12/20 0739 02/13/20 0317 02/14/20 0617  CRP  --   --   --  19.9* 18.5*  BNP 191.8*  --   --  159.4* 198.6*  PROCALCITON  --   --  0.53 0.58 0.54  SARSCOV2NAA  --  NEGATIVE  --   --   --     ------------------------------------------------------------------------------------------------------------------ No results for input(s): CHOL, HDL, LDLCALC, TRIG, CHOLHDL, LDLDIRECT in the last 72 hours.  Lab Results  Component Value Date   HGBA1C 6.2 (H) 01/01/2020   ------------------------------------------------------------------------------------------------------------------ Recent Labs    02/12/20 0739  TSH 2.638   ------------------------------------------------------------------------------------------------------------------ Recent Labs    02/11/20 2126  VITAMINB12 436  FOLATE 14.2  FERRITIN 1,812*  TIBC 133*  IRON 18*  RETICCTPCT 2.1    Coagulation profile No results for input(s): INR, PROTIME in the last 168 hours.  No results for input(s): DDIMER in the last 72 hours.  Cardiac Enzymes No results for input(s): CKMB, TROPONINI, MYOGLOBIN in the last 168 hours.  Invalid input(s): CK ------------------------------------------------------------------------------------------------------------------    Component Value Date/Time   BNP 198.6 (H) 02/14/2020 0617    Micro Results Recent Results (from the past 240 hour(s))  Respiratory Panel by RT PCR (Flu A&B, Covid) - Nasopharyngeal  Swab     Status: None   Collection Time: 02/11/20  4:01 PM   Specimen: Nasopharyngeal Swab  Result Value Ref Range Status   SARS Coronavirus 2 by RT PCR NEGATIVE NEGATIVE Final    Comment: (NOTE) SARS-CoV-2 target nucleic acids are NOT DETECTED. The SARS-CoV-2 RNA is generally detectable in upper respiratoy specimens during the acute phase of infection. The lowest concentration of SARS-CoV-2 viral copies this assay can detect is 131 copies/mL. A negative result does not preclude SARS-Cov-2 infection and should not be used as the sole basis for treatment or other patient management decisions. A negative result may occur with  improper specimen collection/handling, submission of specimen other than nasopharyngeal swab, presence of viral mutation(s) within the areas targeted by this assay, and inadequate number of viral copies (<131 copies/mL). A negative result must be combined with clinical observations, patient history, and epidemiological information. The expected result is Negative. Fact Sheet for Patients:  https://www.moore.com/ Fact Sheet for Healthcare Providers:  https://www.young.biz/ This test is not yet ap proved or cleared by the [place] FDA and  has been authorized for detection and/or diagnosis of SARS-CoV-2 by FDA under an Emergency Use Authorization (EUA). This EUA will remain  in effect (meaning this test can be used) for the duration of the COVID-19 declaration under Section 564(b)(1) of the Act, 21 U.S.C. section 360bbb-3(b)(1), unless the authorization is terminated or revoked sooner.    Influenza A by PCR NEGATIVE NEGATIVE Final   Influenza B by PCR NEGATIVE NEGATIVE Final    Comment: (NOTE) The Xpert Xpress SARS-CoV-2/FLU/RSV assay is intended as an aid in  the diagnosis of influenza from Nasopharyngeal swab specimens and  should not be used as a sole basis for treatment. Nasal washings and  aspirates are  unacceptable for Xpert Xpress SARS-CoV-2/FLU/RSV  testing. Fact Sheet for Patients: https://www.moore.com/ Fact Sheet for Healthcare Providers: https://www.young.biz/ This test is not yet approved or cleared by the [place] and  has been authorized for detection and/or diagnosis of SARS-CoV-2 by  FDA under an Emergency Use Authorization (EUA). This EUA will remain  in effect (meaning this test can be used) for the duration of the  Covid-19 declaration under Section 564(b)(1) of the Act, 21  U.S.C. section 360bbb-3(b)(1), unless the authorization is  terminated or revoked. Performed at [facility], [address] [place], [place] [address]   Culture, blood (routine x 2)     Status: None (Preliminary result)   Collection Time: 02/11/20  9:26 PM   Specimen: BLOOD  Result Value Ref Range Status   Specimen Description BLOOD LEFT ANTECUBITAL  Final   Special Requests   Final    BOTTLES DRAWN AEROBIC AND ANAEROBIC Blood Culture adequate volume   Culture   Final    NO GROWTH 3 DAYS Performed at [facility], [address] [place], [place] [address]    Report Status PENDING  Incomplete  Culture, blood (routine x 2)     Status: None (Preliminary result)   Collection Time: 02/11/20  9:53 PM   Specimen: BLOOD RIGHT HAND  Result Value Ref Range Status   Specimen Description BLOOD RIGHT HAND  Final   Special Requests   Final    BOTTLES DRAWN AEROBIC ONLY Blood Culture adequate volume   Culture   Final    NO GROWTH 3 DAYS Performed at [facility], [address] [place], [place] [address]    Report Status PENDING  Incomplete    Radiology Reports DG Chest 2 View  Result Date: 02/11/2020 CLINICAL DATA:  Shortness of breath EXAM: CHEST - 2 VIEW COMPARISON:  January 23, 2020 FINDINGS: There is either scarring in the lateral right base or small right pleural effusion. There is apparent scarring in the right mid lung as well.  Lungs elsewhere are clear. Heart size and pulmonary vascularity are normal. There is aortic atherosclerosis. No adenopathy. There is degenerative change in the thoracic spine. IMPRESSION: Scarring right mid lung. Question scarring lateral right base versus small right pleural effusion. Lungs elsewhere clear. Stable cardiac silhouette.  Aortic Atherosclerosis (ICD10-I70.0). Electronically Signed   By: Bretta BangWilliam  Woodruff III M.D.   On: 02/11/2020 11:40   DG Chest 2 View  Result Date: 01/21/2020 CLINICAL DATA:  Pneumonia 2 months ago. Dry cough. EXAM: CHEST - 2 VIEW COMPARISON:  Chest x-ray dated 12/25/2019. FINDINGS: Improved aeration at the RIGHT lung base/costophrenic angle. No new opacity. Lungs are hyperexpanded. Chronic bronchitic changes noted centrally. Heart size and mediastinal contours are within normal limits. No acute or suspicious osseous finding. IMPRESSION: 1. No active cardiopulmonary disease. No evidence of pneumonia or pulmonary edema on today's exam. 2. Improved aeration at the RIGHT lung base compared to the previous chest x-ray of 12/25/2019. 3. Hyperexpanded lungs indicating COPD. Associated chronic bronchitic changes centrally. Electronically Signed   By: Bary RichardStan  Maynard M.D.   On: 01/21/2020 17:09   CT CHEST WO CONTRAST  Result Date: 02/12/2020 CLINICAL DATA:  Cancer of unknown primary. EXAM: CT CHEST WITHOUT CONTRAST TECHNIQUE: Multidetector CT imaging of the chest was performed following the standard protocol without IV contrast. COMPARISON:  January 23, 2020. FINDINGS: Cardiovascular: Atherosclerosis of thoracic aorta is noted without aneurysm formation. Normal cardiac size. No pericardial effusion. Coronary artery calcifications are noted. Mediastinum/Nodes: No enlarged mediastinal or axillary lymph nodes. Thyroid gland, trachea, and esophagus demonstrate no significant findings. Lungs/Pleura: No pneumothorax is noted. Minimal right pleural effusion is noted. Minimal bilateral posterior  basilar subsegmental atelectasis is noted. Stable left apical scarring is noted. Stable right middle lobe scarring is noted. Stable right posterior upper lobe opacity is noted concerning for pneumonia or possibly scarring. Upper Abdomen: No acute abnormality. Musculoskeletal: No chest wall mass or suspicious bone lesions identified. IMPRESSION: 1. Coronary artery calcifications are noted suggesting coronary artery disease. 2. Minimal right pleural effusion is noted. 3. Stable right posterior upper lobe opacity is noted concerning for pneumonia or possibly scarring. Stable right middle lobe scarring is noted. 4. Stable left apical scarring is noted. 5. Minimal bilateral posterior basilar subsegmental atelectasis is noted. Aortic Atherosclerosis (ICD10-I70.0). Electronically Signed   By: Lupita Raider M.D.   On: 02/12/2020 11:46   CT Chest W Contrast  Result Date: 01/23/2020 CLINICAL DATA:  85 year old male with generalized weakness. EXAM: CT CHEST WITH CONTRAST TECHNIQUE: Multidetector CT imaging of the chest was performed during intravenous contrast administration. CONTRAST:  75mL OMNIPAQUE IOHEXOL 300 MG/ML  SOLN COMPARISON:  Chest radiograph dated 01/23/2020. Chest CT dated 12/29/2019 FINDINGS: Cardiovascular: There is no cardiomegaly or pericardial effusion. Coronary vascular calcification primarily involving the LAD and left circumflex artery. There is moderate atherosclerotic calcification of the thoracic aorta. No aneurysmal dilatation or dissection. Evaluation of the pulmonary arteries is limited due to suboptimal opacification. No definite pulmonary artery embolus identified. Mediastinum/Nodes: There is no hilar or mediastinal adenopathy. Right hilar lymph node measures 7 mm in short axis. The esophagus is grossly unremarkable. No mediastinal fluid collection. Lungs/Pleura: There is a small right pleural effusion, decreased in size since the prior CT. Right upper lobe streaky and nodular densities most  consistent with pneumonia. Overall improvement of the right upper lobe densities since the prior CT. Clinical correlation and continued follow-up recommended. Additional nodular densities in the left apex and left lower lobe. There is no pneumothorax. The central airways are patent. There is mild bronchial wall thickening most consistent with bronchitis. Biapical scarring noted. Upper Abdomen: Small gallstones. Musculoskeletal: Degenerative changes of the spine. No acute osseous pathology. IMPRESSION: 1. Right upper lobe pneumonia, improved since the prior CT. Clinical correlation and continued follow-up recommended. 2. Small right pleural effusion, decreased in size since the prior CT. 3. Mild bronchial wall thickening most consistent with bronchitis. 4. Cholelithiasis. 5. Aortic Atherosclerosis (ICD10-I70.0). Electronically Signed   By: Elgie Collard M.D.   On: 01/23/2020 17:23   MR BRAIN WO CONTRAST  Result Date: 02/11/2020 CLINICAL DATA:  Subacute neuro deficit EXAM: MRI HEAD WITHOUT CONTRAST TECHNIQUE: Multiplanar, multiecho pulse sequences of the brain and surrounding structures were obtained without intravenous contrast. COMPARISON:  02/17/2009 FINDINGS: Brain: No acute infarction, hemorrhage, hydrocephalus, extra-axial collection or mass lesion. Cerebral volume loss and ventriculomegaly that is progressed from 2010. No significant ischemic changes. Vascular: Normal flow voids Skull and upper cervical spine: Normal marrow signal Sinuses/Orbits: Marked atrophy of the right optic nerve compared to the left, see coronal reformats. There is history of blindness on the right per the chart. Bilateral mastoid opacification. Left sphenoid sinus opacification. IMPRESSION: 1. No acute or reversible finding. 2. Brain atrophy that has progressed from 2010. 3. Left sphenoid sinus and bilateral mastoid opacification with negative nasopharynx. Electronically Signed   By: Marnee Spring M.D.   On: 02/11/2020 19:17    CT Abdomen Pelvis W Contrast  Result Date: 02/11/2020 CLINICAL DATA:  Nausea and vomiting. Weakness. Recent pneumonia EXAM: CT ABDOMEN AND PELVIS WITH CONTRAST TECHNIQUE: Multidetector CT imaging of the abdomen and pelvis was performed using the standard protocol following bolus  administration of intravenous contrast. CONTRAST:  156mL OMNIPAQUE IOHEXOL 300 MG/ML  SOLN COMPARISON:  None. FINDINGS: Lower Chest: No acute findings. Tiny pleural effusions noted bilaterally. Hepatobiliary: No hepatic masses identified. A few tiny sub-cm calcified gallstones are seen, however there is no evidence of cholecystitis or biliary dilatation. Pancreas:  No mass or inflammatory changes. Spleen: Within normal limits in size and appearance. Adrenals/Urinary Tract: No masses identified. No evidence of ureteral calculi or hydronephrosis. Stomach/Bowel: No evidence of obstruction, inflammatory process or abnormal fluid collections. Normal appendix visualized. Diverticulosis is seen mainly involving the sigmoid colon, however there is no evidence of diverticulitis. Vascular/Lymphatic: No pathologically enlarged lymph nodes. No abdominal aortic aneurysm. Aortic atherosclerosis noted. Reproductive:  No mass or other significant abnormality. Other:  None. Musculoskeletal:  No suspicious bone lesions identified. IMPRESSION: 1. Colonic diverticulosis, without radiographic evidence of diverticulitis or other acute findings. 2. Cholelithiasis. No radiographic evidence of cholecystitis. 3. Tiny bilateral pleural effusions. Aortic Atherosclerosis (ICD10-I70.0). Electronically Signed   By: Marlaine Hind M.D.   On: 02/11/2020 14:08   DG Chest Portable 1 View  Result Date: 01/23/2020 CLINICAL DATA:  Progressive weakness. Recent history of pneumonia. EXAM: PORTABLE CHEST 1 VIEW COMPARISON:  Chest x-ray dated 01/19/2020 and chest CT dated 12/29/2019 FINDINGS: The heart size and pulmonary vascularity are normal. Aortic atherosclerosis. Chronic  blunting of the right costophrenic angle laterally. Chronic slight parenchymal scarring in the right midzone. Left lung is clear. No acute bone abnormality. Moderate arthritic changes of both glenohumeral joints. IMPRESSION: 1. No acute abnormalities. 2.  Aortic Atherosclerosis (ICD10-I70.0). Electronically Signed   By: Lorriane Shire M.D.   On: 01/23/2020 13:45    Time Spent in minutes  30   Lala Lund M.D on 02/14/2020 at 9:30 AM  To page go to [email or web address] - password [facility]

## 2020-02-14 NOTE — Plan of Care (Signed)
  Problem: Education: Goal: Knowledge of General Education information will improve Description: Including pain rating scale, medication(s)/side effects and non-pharmacologic comfort measures Outcome: Progressing   Problem: Safety: Goal: Ability to remain free from injury will improve Outcome: Progressing   

## 2020-02-15 LAB — CBC WITH DIFFERENTIAL/PLATELET
Abs Immature Granulocytes: 0.39 10*3/uL — ABNORMAL HIGH (ref 0.00–0.07)
Basophils Absolute: 0.1 10*3/uL (ref 0.0–0.1)
Basophils Relative: 0 %
Eosinophils Absolute: 0.8 10*3/uL — ABNORMAL HIGH (ref 0.0–0.5)
Eosinophils Relative: 3 %
HCT: 27.9 % — ABNORMAL LOW (ref 39.0–52.0)
Hemoglobin: 8.8 g/dL — ABNORMAL LOW (ref 13.0–17.0)
Immature Granulocytes: 2 %
Lymphocytes Relative: 13 %
Lymphs Abs: 3.1 10*3/uL (ref 0.7–4.0)
MCH: 26.9 pg (ref 26.0–34.0)
MCHC: 31.5 g/dL (ref 30.0–36.0)
MCV: 85.3 fL (ref 80.0–100.0)
Monocytes Absolute: 1.5 10*3/uL — ABNORMAL HIGH (ref 0.1–1.0)
Monocytes Relative: 6 %
Neutro Abs: 18.5 10*3/uL — ABNORMAL HIGH (ref 1.7–7.7)
Neutrophils Relative %: 76 %
Platelets: 458 10*3/uL — ABNORMAL HIGH (ref 150–400)
RBC: 3.27 MIL/uL — ABNORMAL LOW (ref 4.22–5.81)
RDW: 15.3 % (ref 11.5–15.5)
WBC: 24.3 10*3/uL — ABNORMAL HIGH (ref 4.0–10.5)
nRBC: 0 % (ref 0.0–0.2)

## 2020-02-15 LAB — PROTEIN / CREATININE RATIO, URINE
Creatinine, Urine: 78.71 mg/dL
Protein Creatinine Ratio: 1.55 mg/mg{Cre} — ABNORMAL HIGH (ref 0.00–0.15)
Total Protein, Urine: 122 mg/dL

## 2020-02-15 LAB — MAGNESIUM: Magnesium: 1.8 mg/dL (ref 1.7–2.4)

## 2020-02-15 LAB — COMPREHENSIVE METABOLIC PANEL
ALT: 34 U/L (ref 0–44)
AST: 38 U/L (ref 15–41)
Albumin: 1.1 g/dL — ABNORMAL LOW (ref 3.5–5.0)
Alkaline Phosphatase: 165 U/L — ABNORMAL HIGH (ref 38–126)
Anion gap: 10 (ref 5–15)
BUN: 35 mg/dL — ABNORMAL HIGH (ref 8–23)
CO2: 25 mmol/L (ref 22–32)
Calcium: 7.4 mg/dL — ABNORMAL LOW (ref 8.9–10.3)
Chloride: 93 mmol/L — ABNORMAL LOW (ref 98–111)
Creatinine, Ser: 1.42 mg/dL — ABNORMAL HIGH (ref 0.61–1.24)
GFR calc Af Amer: 53 mL/min — ABNORMAL LOW (ref >60)
GFR calc non Af Amer: 46 mL/min — ABNORMAL LOW (ref >60)
Glucose, Bld: 115 mg/dL — ABNORMAL HIGH (ref 70–99)
Potassium: 3.6 mmol/L (ref 3.5–5.1)
Sodium: 128 mmol/L — ABNORMAL LOW (ref 135–145)
Total Bilirubin: 0.8 mg/dL (ref 0.3–1.2)
Total Protein: 6.2 g/dL — ABNORMAL LOW (ref 6.5–8.1)

## 2020-02-15 LAB — C-REACTIVE PROTEIN: CRP: 16.9 mg/dL — ABNORMAL HIGH (ref <1.0)

## 2020-02-15 LAB — SEDIMENTATION RATE: Sed Rate: 110 mm/hr — ABNORMAL HIGH (ref 0–16)

## 2020-02-15 LAB — PROCALCITONIN: Procalcitonin: 0.73 ng/mL

## 2020-02-15 LAB — BRAIN NATRIURETIC PEPTIDE: B Natriuretic Peptide: 149.3 pg/mL — ABNORMAL HIGH (ref 0.0–100.0)

## 2020-02-15 LAB — CK: Total CK: 11 U/L — ABNORMAL LOW (ref 49–397)

## 2020-02-15 MED ORDER — PREDNISONE 50 MG PO TABS: 50.0000 mg | ORAL_TABLET | Freq: Every day | ORAL | Status: DC

## 2020-02-15 MED ORDER — FUROSEMIDE 40 MG PO TABS
40.0000 mg | ORAL_TABLET | Freq: Two times a day (BID) | ORAL | Status: DC
  Administered 2020-02-15 – 2020-02-19 (×9): 40 mg via ORAL
  Filled 2020-02-15 (×9): qty 1

## 2020-02-15 MED ORDER — PREDNISONE 50 MG PO TABS
50.0000 mg | ORAL_TABLET | Freq: Every day | ORAL | Status: DC
  Administered 2020-02-15 – 2020-02-16 (×2): 50 mg via ORAL
  Filled 2020-02-15 (×2): qty 1

## 2020-02-15 MED ORDER — METRONIDAZOLE 500 MG PO TABS
500.0000 mg | ORAL_TABLET | Freq: Three times a day (TID) | ORAL | Status: DC
  Administered 2020-02-16 – 2020-02-19 (×11): 500 mg via ORAL
  Filled 2020-02-15 (×11): qty 1

## 2020-02-15 NOTE — Progress Notes (Signed)
Occupational Therapy Treatment Patient Details Name: Jeffrey Ho MRN: [id number] DOB: 04/12/1935 Today's Date: 02/15/2020    History of present illness Jeffrey Ho is a 85 y.o. male with medical history significant of R eye blindness; carotid artery occlusion; HTN; and HLD.  He was admitted from 3/28-30 for CAP with demand ischemia; he was discharged on Augmentin.  During that admission, he had heme positive stools but had stable Hgb and no overt bleeding and so was planned for outpatient GI f/u.  He returned from 4/20-23 with symptomatic anemia and hyponatremia.  EGD/colonoscopy performed with reflux erosive esophagitis and diverticulosis/polyps, he was subsequently discharged home presented back to the hospital again on 02/11/2020 with generalized weakness, worsening anemia, hyponatremia and excessive fatigue and was admitted to the hospital for further work-up.   OT comments  Pt progressing well towards therapy goals. Pt is very HOH. Pt is very motivated and was able to progress to a chair follow and will no longer require co-tx sessions. He was min guard for transfers, continues to require at least one SUE for standing balance tasks (such as sink level grooming), continues to be max A for LB ADL (edema still present). And will benefit from continued OT in the acute setting as well as afterwards at the SNF level. Pt is so motivated that next session it would be good to provide HEP with theraband for him to perform in between therapy sessions in addition to standing balance and working on activity tolerance.    Follow Up Recommendations  SNF    Equipment Recommendations  Other (comment)(defer to next venue of care)    Recommendations for Other Services      Precautions / Restrictions Precautions Precautions: Fall Precaution Comments: Fatigues quickly Restrictions Weight Bearing Restrictions: No       Mobility Bed Mobility               General bed mobility comments: OOB  in recliner at beginning and end of session  Transfers Overall transfer level: Needs assistance Equipment used: Rolling walker (2 wheeled) Transfers: Sit to/from Stand Sit to Stand: Min guard;+2 safety/equipment         General transfer comment: vc for safe hand placement but no physical assist needed    Balance Overall balance assessment: Needs assistance Sitting-balance support: Feet supported;No upper extremity supported Sitting balance-Leahy Scale: Fair     Standing balance support: During functional activity;Bilateral upper extremity supported Standing balance-Leahy Scale: Poor Standing balance comment: requires RW                           ADL either performed or assessed with clinical judgement   ADL Overall ADL's : Needs assistance/impaired     Grooming: Wash/dry hands;Wash/dry face;Standing;Min guard Grooming Details (indicate cue type and reason): sink level                 Toilet Transfer: Min guard;+2 for safety/equipment;RW;Cueing for Chief of Staff Details (indicate cue type and reason): vc for safe hand placement         Functional mobility during ADLs: Min guard;Cueing for safety;Rolling walker General ADL Comments: continues to fatigue quickly     Vision       Perception     Praxis      Cognition Arousal/Alertness: Awake/alert Behavior During Therapy: WFL for tasks assessed/performed Overall Cognitive Status: Within Functional Limits for tasks assessed  Exercises     Shoulder Instructions       General Comments wife present throughout session    Pertinent Vitals/ Pain       Pain Assessment: No/denies pain  Home Living                                          Prior Functioning/Environment              Frequency  Min 2X/week        Progress Toward Goals  OT Goals(current goals can now be found in the care plan  section)  Progress towards OT goals: Progressing toward goals  Acute Rehab OT Goals Patient Stated Goal: Get stronger OT Goal Formulation: With patient/family Time For Goal Achievement: 02/26/20 Potential to Achieve Goals: Good  Plan Discharge plan remains appropriate;Frequency remains appropriate    Co-evaluation    PT/OT/SLP Co-Evaluation/Treatment: Yes Reason for Co-Treatment: For patient/therapist safety PT goals addressed during session: Mobility/safety with mobility;Balance;Proper use of DME OT goals addressed during session: ADL's and self-care;Proper use of Adaptive equipment and DME      AM-PAC OT "6 Clicks" Daily Activity     Outcome Measure   Help from another person eating meals?: None Help from another person taking care of personal grooming?: A Little Help from another person toileting, which includes using toliet, bedpan, or urinal?: A Lot Help from another person bathing (including washing, rinsing, drying)?: A Lot Help from another person to put on and taking off regular upper body clothing?: A Little Help from another person to put on and taking off regular lower body clothing?: A Lot 6 Click Score: 16    End of Session Equipment Utilized During Treatment: Gait belt;Rolling walker  OT Visit Diagnosis: Unsteadiness on feet (R26.81);Other abnormalities of gait and mobility (R26.89);Muscle weakness (generalized) (M62.81)   Activity Tolerance Patient tolerated treatment well   Patient Left in chair;with call bell/phone within reach;with chair alarm set;with family/visitor present;Other (comment)(nephrology DO in room)   Nurse Communication Mobility status        Time: 1062-6948 OT Time Calculation (min): 24 min  Charges: OT General Charges $OT Visit: 1 Visit OT Treatments $Self Care/Home Management : 8-22 mins  Jesse Sans OTR/L Acute Rehabilitation Services Pager: [phone] Office: [place] 02/15/2020, 12:17 PM

## 2020-02-15 NOTE — Plan of Care (Signed)
  Problem: Education: Goal: Knowledge of General Education information will improve Description: Including pain rating scale, medication(s)/side effects and non-pharmacologic comfort measures Outcome: Progressing   Problem: Activity: Goal: Risk for activity intolerance will decrease Outcome: Progressing   Problem: Nutrition: Goal: Adequate nutrition will be maintained Outcome: Progressing   

## 2020-02-15 NOTE — Plan of Care (Signed)
°  Problem: Clinical Measurements: °Goal: Ability to maintain clinical measurements within normal limits will improve °Outcome: Progressing °  °Problem: Activity: °Goal: Risk for activity intolerance will decrease °Outcome: Progressing °  °Problem: Nutrition: °Goal: Adequate nutrition will be maintained °Outcome: Progressing °  °

## 2020-02-15 NOTE — Progress Notes (Signed)
HEMATOLOGY-ONCOLOGY PROGRESS NOTE  SUBJECTIVE:  Jeffrey Ho looks more alert and responsive and looks pinker as well after his blood transfusion.  He tells me he was able to sit up for at least 2 hours yesterday.  His wife Jeffrey Ho tells me she has been doing everything for him but that she really needs to stop and start letting him do more.  We reviewed his situation and basically he has not been well since February of this year and after each admission he seemed to get weaker and do even worse by the time he got home.  She is worried that he might again have the same pattern this time  REVIEW OF SYSTEMS:   He denies shortness of breath at rest, cough, phlegm production, or pleurisy.  He is not aware of a rash.  He denies significant joint swelling or pain.  He has not had drenching sweats.   PHYSICAL EXAMINATION: Elderly African-American man examined in bed.  ECOG PERFORMANCE STATUS: 2 - Symptomatic, <50% confined to bed  Vitals:   02/14/20 2203 02/15/20 0524  BP: 139/66 130/69  Pulse: (!) 102 (!) 104  Resp:  20  Temp: 99.6 F (37.6 C) 99.8 F (37.7 C)  SpO2: 100% 98%   Filed Weights   02/11/20 1142 02/11/20 2036 02/12/20 2055  Weight: 214 lb 15.2 oz (97.5 kg) 210 lb 15.7 oz (95.7 kg) 216 lb 4.3 oz (98.1 kg)    Intake/Output from previous day: 05/12 0701 - 05/13 0700 In: 2577.2 [P.O.:1444; I.V.:771.6; IV Piggyback:361.6] Out: 1375 [Urine:1375]    LABORATORY DATA:   CMP Latest Ref Rng & Units 02/15/2020 02/14/2020 02/13/2020  Glucose 70 - 99 mg/dL 322(G) 254(Y) 706(C)  BUN 8 - 23 mg/dL 37(S) 28(B) 23  Creatinine 0.61 - 1.24 mg/dL 1.51(V) 6.16(W) 7.37(T)  Sodium 135 - 145 mmol/L 128(L) 127(L) 128(L)  Potassium 3.5 - 5.1 mmol/L 3.6 4.0 3.6  Chloride 98 - 111 mmol/L 93(L) 93(L) 94(L)  CO2 22 - 32 mmol/L 25 24 23   Calcium 8.9 - 10.3 mg/dL 7.4(L) 7.5(L) 7.5(L)  Total Protein 6.5 - 8.1 g/dL 6.2(L) 6.3(L) 5.9(L)  Total Bilirubin 0.3 - 1.2 mg/dL 0.8 0.7 0.8  Alkaline Phos 38 - 126  U/L 165(H) 174(H) 184(H)  AST 15 - 41 U/L 38 54(H) 37  ALT 0 - 44 U/L 34 38 25    Lab Results  Component Value Date   WBC 24.3 (H) 02/15/2020   HGB 8.8 (L) 02/15/2020   HCT 27.9 (L) 02/15/2020   MCV 85.3 02/15/2020   PLT 458 (H) 02/15/2020   NEUTROABS 18.5 (H) 02/15/2020    DG Chest 2 View  Result Date: 02/11/2020 CLINICAL DATA:  Shortness of breath EXAM: CHEST - 2 VIEW COMPARISON:  January 23, 2020 FINDINGS: There is either scarring in the lateral right base or small right pleural effusion. There is apparent scarring in the right mid lung as well. Lungs elsewhere are clear. Heart size and pulmonary vascularity are normal. There is aortic atherosclerosis. No adenopathy. There is degenerative change in the thoracic spine. IMPRESSION: Scarring right mid lung. Question scarring lateral right base versus small right pleural effusion. Lungs elsewhere clear. Stable cardiac silhouette.  Aortic Atherosclerosis (ICD10-I70.0). Electronically Signed   By: January 25, 2020 III M.D.   On: 02/11/2020 11:40   DG Chest 2 View  Result Date: 01/21/2020 CLINICAL DATA:  Pneumonia 2 months ago. Dry cough. EXAM: CHEST - 2 VIEW COMPARISON:  Chest x-ray dated 12/25/2019. FINDINGS: Improved aeration at the RIGHT  lung base/costophrenic angle. No new opacity. Lungs are hyperexpanded. Chronic bronchitic changes noted centrally. Heart size and mediastinal contours are within normal limits. No acute or suspicious osseous finding. IMPRESSION: 1. No active cardiopulmonary disease. No evidence of pneumonia or pulmonary edema on today's exam. 2. Improved aeration at the RIGHT lung base compared to the previous chest x-ray of 12/25/2019. 3. Hyperexpanded lungs indicating COPD. Associated chronic bronchitic changes centrally. Electronically Signed   By: Bary Richard M.D.   On: 01/21/2020 17:09   CT CHEST WO CONTRAST  Result Date: 02/12/2020 CLINICAL DATA:  Cancer of unknown primary. EXAM: CT CHEST WITHOUT CONTRAST TECHNIQUE:  Multidetector CT imaging of the chest was performed following the standard protocol without IV contrast. COMPARISON:  January 23, 2020. FINDINGS: Cardiovascular: Atherosclerosis of thoracic aorta is noted without aneurysm formation. Normal cardiac size. No pericardial effusion. Coronary artery calcifications are noted. Mediastinum/Nodes: No enlarged mediastinal or axillary lymph nodes. Thyroid gland, trachea, and esophagus demonstrate no significant findings. Lungs/Pleura: No pneumothorax is noted. Minimal right pleural effusion is noted. Minimal bilateral posterior basilar subsegmental atelectasis is noted. Stable left apical scarring is noted. Stable right middle lobe scarring is noted. Stable right posterior upper lobe opacity is noted concerning for pneumonia or possibly scarring. Upper Abdomen: No acute abnormality. Musculoskeletal: No chest wall mass or suspicious bone lesions identified. IMPRESSION: 1. Coronary artery calcifications are noted suggesting coronary artery disease. 2. Minimal right pleural effusion is noted. 3. Stable right posterior upper lobe opacity is noted concerning for pneumonia or possibly scarring. Stable right middle lobe scarring is noted. 4. Stable left apical scarring is noted. 5. Minimal bilateral posterior basilar subsegmental atelectasis is noted. Aortic Atherosclerosis (ICD10-I70.0). Electronically Signed   By: Lupita Raider M.D.   On: 02/12/2020 11:46   CT Chest W Contrast  Result Date: 01/23/2020 CLINICAL DATA:  85 year old male with generalized weakness. EXAM: CT CHEST WITH CONTRAST TECHNIQUE: Multidetector CT imaging of the chest was performed during intravenous contrast administration. CONTRAST:  76mL OMNIPAQUE IOHEXOL 300 MG/ML  SOLN COMPARISON:  Chest radiograph dated 01/23/2020. Chest CT dated 12/29/2019 FINDINGS: Cardiovascular: There is no cardiomegaly or pericardial effusion. Coronary vascular calcification primarily involving the LAD and left circumflex artery.  There is moderate atherosclerotic calcification of the thoracic aorta. No aneurysmal dilatation or dissection. Evaluation of the pulmonary arteries is limited due to suboptimal opacification. No definite pulmonary artery embolus identified. Mediastinum/Nodes: There is no hilar or mediastinal adenopathy. Right hilar lymph node measures 7 mm in short axis. The esophagus is grossly unremarkable. No mediastinal fluid collection. Lungs/Pleura: There is a small right pleural effusion, decreased in size since the prior CT. Right upper lobe streaky and nodular densities most consistent with pneumonia. Overall improvement of the right upper lobe densities since the prior CT. Clinical correlation and continued follow-up recommended. Additional nodular densities in the left apex and left lower lobe. There is no pneumothorax. The central airways are patent. There is mild bronchial wall thickening most consistent with bronchitis. Biapical scarring noted. Upper Abdomen: Small gallstones. Musculoskeletal: Degenerative changes of the spine. No acute osseous pathology. IMPRESSION: 1. Right upper lobe pneumonia, improved since the prior CT. Clinical correlation and continued follow-up recommended. 2. Small right pleural effusion, decreased in size since the prior CT. 3. Mild bronchial wall thickening most consistent with bronchitis. 4. Cholelithiasis. 5. Aortic Atherosclerosis (ICD10-I70.0). Electronically Signed   By: Elgie Collard M.D.   On: 01/23/2020 17:23   Jeffrey BRAIN WO CONTRAST  Result Date: 02/11/2020 CLINICAL DATA:  Subacute  neuro deficit EXAM: MRI HEAD WITHOUT CONTRAST TECHNIQUE: Multiplanar, multiecho pulse sequences of the brain and surrounding structures were obtained without intravenous contrast. COMPARISON:  02/17/2009 FINDINGS: Brain: No acute infarction, hemorrhage, hydrocephalus, extra-axial collection or mass lesion. Cerebral volume loss and ventriculomegaly that is progressed from 2010. No significant ischemic  changes. Vascular: Normal flow voids Skull and upper cervical spine: Normal marrow signal Sinuses/Orbits: Marked atrophy of the right optic nerve compared to the left, see coronal reformats. There is history of blindness on the right per the chart. Bilateral mastoid opacification. Left sphenoid sinus opacification. IMPRESSION: 1. No acute or reversible finding. 2. Brain atrophy that has progressed from 2010. 3. Left sphenoid sinus and bilateral mastoid opacification with negative nasopharynx. Electronically Signed   By: Marnee Spring M.D.   On: 02/11/2020 19:17   CT Abdomen Pelvis W Contrast  Result Date: 02/11/2020 CLINICAL DATA:  Nausea and vomiting. Weakness. Recent pneumonia EXAM: CT ABDOMEN AND PELVIS WITH CONTRAST TECHNIQUE: Multidetector CT imaging of the abdomen and pelvis was performed using the standard protocol following bolus administration of intravenous contrast. CONTRAST:  OMNIPAQUE IOHEXOL 300 MG/ML  SOLN COMPARISON:  None. FINDINGS: Lower Chest: No acute findings. Tiny pleural effusions noted bilaterally. Hepatobiliary: No hepatic masses identified. A few tiny sub-cm calcified gallstones are seen, however there is no evidence of cholecystitis or biliary dilatation. Pancreas:  No mass or inflammatory changes. Spleen: Within normal limits in size and appearance. Adrenals/Urinary Tract: No masses identified. No evidence of ureteral calculi or hydronephrosis. Stomach/Bowel: No evidence of obstruction, inflammatory process or abnormal fluid collections. Normal appendix visualized. Diverticulosis is seen mainly involving the sigmoid colon, however there is no evidence of diverticulitis. Vascular/Lymphatic: No pathologically enlarged lymph nodes. No abdominal aortic aneurysm. Aortic atherosclerosis noted. Reproductive:  No mass or other significant abnormality. Other:  None. Musculoskeletal:  No suspicious bone lesions identified. IMPRESSION: 1. Colonic diverticulosis, without radiographic  evidence of diverticulitis or other acute findings. 2. Cholelithiasis. No radiographic evidence of cholecystitis. 3. Tiny bilateral pleural effusions. Aortic Atherosclerosis (ICD10-I70.0). Electronically Signed   By: Danae Orleans M.D.   On: 02/11/2020 14:08   DG Chest Portable 1 View  Result Date: 01/23/2020 CLINICAL DATA:  Progressive weakness. Recent history of pneumonia. EXAM: PORTABLE CHEST 1 VIEW COMPARISON:  Chest x-ray dated 01/19/2020 and chest CT dated 12/29/2019 FINDINGS: The heart size and pulmonary vascularity are normal. Aortic atherosclerosis. Chronic blunting of the right costophrenic angle laterally. Chronic slight parenchymal scarring in the right midzone. Left lung is clear. No acute bone abnormality. Moderate arthritic changes of both glenohumeral joints. IMPRESSION: 1. No acute abnormalities. 2.  Aortic Atherosclerosis (ICD10-I70.0). Electronically Signed   By: Francene Boyers M.D.   On: 01/23/2020 13:45   ASSESSMENT: 85 y.o. Jeffrey Ho, [place] male with persistent leukocytosis and anemia.  He has failure to thrive with progressive physical decline as well as anorexia and early satiety.  Review of the blood film does not suggest a primary hematologic problem and he has a persistently elevated CRP  PLAN:   Jeffrey. Charlann Lange is marginally better after receiving some blood and is also on antibiotics which should help cover lung and GI issues.  While slightly better clearly there has not been a major turnaround and this is the wife's concern.  I wonder if we are actually dealing with a vasculitis.  The C-reactive protein is certainly high enough to suggest that.  Perhaps a trial of steroids would be a good idea.  We will also send some vasculitis labs  although those will not be available probably for several days.  Discussed with patient and wife and with Dr. Candiss Norse.   LOS: 4 days   Chauncey Cruel, DNP, AGPCNP-BC, AOCNP 02/15/20

## 2020-02-15 NOTE — Progress Notes (Addendum)
Reason for Consult: Lower extremity swelling Referring Physician: Juanito Doom is an 85 y.o. male with PMH of HLD, HTN, HOH with Hearing Aids, Bilateral carotid artery stenosis s/p Endarterectomy (2016), lower extremity swelling, Anemia, and newer-onset weakness.   HPI: Patient's current issues started in February 2021 on a trip to [place], [place], when he was noted to have decreased energy, weakness, and decreased appetite.  Blood work at the time showed leukocytosis, chest x-ray suspicious for pneumonia (patient has been fully vaccinated since November 17, 2019). He had an abnormal chest x-ray 12/25/2019 with density appreciated in the posterior aspect of RUL and RLL, CT chest 12/29/2019 showed atelectasis vs. infiltrate of RLL with loculated right pleural effusion.  Patient hospitalized again December 31, 2019, for possible GI bleed, community-acquired pneumonia and cardiac ischemia (EKG had 2-4 mm ST depression in 2, V3, V4). Presenting symptoms include decreased energy, malaise and dyspnea on exertion for the past week.  Hemoglobin noted to be 9.9.  The patient had seen his primary physician (who labs are not in EPIC) and was started on iron for anemia (patient had positive FOBT during this hospital stay, could be false positive due to iron supplementation). Patient was noted to have normal kidney function with creatinine 0.99, GFR greater than 60, BUN 18.  Patient was then hospitalized January 23, 2020, with symptomatic anemia.  Patient had EGD and colonoscopy performed (4/23) which revealed "two 4-6 mm sessile polyps in sigmoid colon and at splenic flexure, removed with cold snare."  At this time patient was also noted to have fluid in his lower extremity, echocardiogram (performed 4/22) showed "LVEF 60-65%, no diastolic dysfunction appreciated, was normal.  Peripheral smear at the time revealed leukocytosis with neutrophilia and left shift, normocytic anemia". Chest CT  revealed resolving right upper lobe pneumonia.  On Feb 11, 2020, patient returned to emergency department with increased weakness and poor intake after being discharged from hospital.  Was noted to be very weak.  On admission patient noted to had 2-3+ pitting edema to bilateral lower extremities.  Additionally, patient noted to have AKI this admission, baseline creatinine 0.99, was 1.4 this admission, GFR decreased.  Additionally, patient's UA showing 100 protein, rare bacteria, granular casts, all other parameters are within normal limits.  Additionally, patient's albumin continues to be low at 1.1 without clear etiology.  Trend in Creatinine: Creatinine, Ser  Date/Time Value Ref Range Status  02/15/2020 05:40 AM 1.42 (H) 0.61 - 1.24 mg/dL Final  24/58/0998 33:82 AM 1.47 (H) 0.61 - 1.24 mg/dL Final  50/53/9767 34:19 AM 1.28 (H) 0.61 - 1.24 mg/dL Final  37/90/2409 73:53 AM 1.43 (H) 0.61 - 1.24 mg/dL Final  29/92/4268 34:19 PM 1.23 0.61 - 1.24 mg/dL Final  62/22/9798 92:11 AM 0.72 0.61 - 1.24 mg/dL Final  94/17/4081 44:81 AM 0.76 0.61 - 1.24 mg/dL Final  85/63/1497 02:63 AM 0.84 0.61 - 1.24 mg/dL Final  78/58/8502 77:41 PM 0.82 0.61 - 1.24 mg/dL Final  28/78/6767 20:94 AM 0.85 0.61 - 1.24 mg/dL Final  70/96/2836 62:94 PM 0.99 0.61 - 1.24 mg/dL Final  76/54/6503 54:65 PM 0.90 0.61 - 1.24 mg/dL Final  68/09/7516 00:17 PM 0.97 0.61 - 1.24 mg/dL Final  49/44/9675 91:63 AM 0.79 0.4 - 1.5 mg/dL Final  84/66/5993 57:01 AM 0.95 0.4 - 1.5 mg/dL Final  77/93/9030 09:23 AM 0.84 0.4 - 1.5 mg/dL Final  30/04/6225 33:35 AM 0.83 0.4 - 1.5 mg/dL Final  45/62/5638 93:73 PM 0.98 0.4 -  1.5 mg/dL Final    PMH:   Past Medical History:  Diagnosis Date  . Blindness of right eye   . Carotid artery occlusion   . HOH (hard of hearing)   . Hyperlipidemia   . Hypertension     PSH:   Past Surgical History:  Procedure Laterality Date  . BIOPSY  01/26/2020   Procedure: BIOPSY;  Surgeon: Daneil Dolin, MD;   [facility];  Service: Endoscopy;;  gastric biopsies for H. Pylori  . CAROTID ENDARTERECTOMY     left CEA  06/24.2010  . CAROTID ENDARTERECTOMY     right CEA  02/20/2009  . COLONOSCOPY WITH PROPOFOL N/A 01/26/2020   Procedure: COLONOSCOPY WITH PROPOFOL;  Surgeon: Daneil Dolin, MD;  [facility]: [facility];  Service: Endoscopy;  Laterality: N/A;  . ESOPHAGOGASTRODUODENOSCOPY (EGD) WITH PROPOFOL N/A 01/26/2020   Procedure: ESOPHAGOGASTRODUODENOSCOPY (EGD) WITH PROPOFOL;  Surgeon: Daneil Dolin, MD;  [facility]: [facility];  Service: Endoscopy;  Laterality: N/A;  . EYE SURGERY Left Nov. 2015   Cataract  . POLYPECTOMY  01/26/2020   Procedure: POLYPECTOMY;  Surgeon: Daneil Dolin, MD;  [facility]: [facility];  Service: Endoscopy;;  Splenic Flexure polyp     Allergies: No Known Allergies  Medications:   Prior to Admission medications   Medication Sig Start Date End Date Taking? Authorizing Provider  aspirin 81 MG tablet Take 81 mg by mouth daily.   Yes [provider]  atorvastatin (LIPITOR) 20 MG tablet 1 tablet daily. 05/07/16  Yes [provider]  docusate sodium (COLACE) 100 MG capsule Take 1 capsule (100 mg total) by mouth 2 (two) times daily. 01/02/20 01/01/21 Yes Shah, Pratik D, DO  ferrous sulfate 325 (65 FE) MG tablet Take 1 tablet (325 mg total) by mouth daily. Patient taking differently: Take 325 mg by mouth 2 (two) times daily with a meal.  01/02/20 01/01/21 Yes Shah, Pratik D, DO  fish oil-omega-3 fatty acids 1000 MG capsule Take 1 g by mouth daily.    Yes [provider]  folic acid (FOLVITE) 1 MG tablet Take 1 mg by mouth daily. 01/23/20  Yes [provider]  metoprolol tartrate (LOPRESSOR) 25 MG tablet Take 0.5 tablets (12.5 mg total) by mouth 2 (two) times daily. 01/26/20 02/25/20 Yes Shah, Pratik D, DO  niacin 500 MG tablet Take 500 mg by mouth daily with breakfast.   Yes [provider]  ondansetron (ZOFRAN) 4 MG tablet  Take 4 mg by mouth every 8 (eight) hours as needed for nausea/vomiting. 01/27/20  Yes [provider]  benzonatate (TESSALON) 100 MG capsule Take 100 mg by mouth 4 (four) times daily as needed for cough.  12/07/19   [provider]  clotrimazole-betamethasone (LOTRISONE) cream Apply 1 application topically See admin instructions. Apply cream to the affected and surround area(s) of skin twice daily in the morning and evening. 01/23/20   [provider]    Inpatient medications: . sodium chloride   Intravenous Once  . atorvastatin  20 mg Oral Daily  . azithromycin  250 mg Oral Daily  . docusate sodium  100 mg Oral BID  . enoxaparin (LOVENOX) injection  40 mg Subcutaneous Q24H  . feeding supplement (ENSURE ENLIVE)  237 mL Oral TID BM  . feeding supplement (PRO-STAT SUGAR FREE 64)  30 mL Oral TID WC  . ferrous sulfate  325 mg Oral BID WC  . folic acid  1 mg Oral Daily  . pantoprazole  40 mg Oral Daily  . predniSONE  50 mg Oral Q breakfast     Discontinued Meds:   Medications Discontinued During This Encounter  Medication Reason  . acetaminophen (TYLENOL) suppository 650 mg   . ondansetron (ZOFRAN) tablet 4 mg   . morphine 2 MG/ML injection 2 mg   . zolpidem (AMBIEN) tablet 5 mg   . HYDROcodone-acetaminophen (NORCO/VICODIN) 5-325 MG per tablet 1-2 tablet   . furosemide (LASIX) tablet 40 mg   . spironolactone (ALDACTONE) tablet 25 mg   . feeding supplement (BOOST / RESOURCE BREEZE) liquid 1 Container   . HYDROcodone-acetaminophen (NORCO/VICODIN) 5-325 MG per tablet 1 tablet   . predniSONE (DELTASONE) tablet 50 mg     Social History:  reports that he quit smoking about 50 years ago. He has never used smokeless tobacco. He reports that he does not drink alcohol or use drugs.  Family History:   Family History  Problem Relation Age of Onset  . Heart disease Father        Before age 70    Pertinent items are noted in HPI.   Weight change:   Intake/Output  Summary (Last 24 hours) at 02/15/2020 1520 Last data filed at 02/15/2020 1400 Gross per 24 hour  Intake 2692 ml  Output 1775 ml  Net 917 ml   BP 130/69 (BP Location: Left Arm)   Pulse (!) 104   Temp 99.8 F (37.7 C) (Oral)   Resp 20   Ht 6' (1.829 m)   Wt 98.1 kg   SpO2 98%   BMI 29.33 kg/m  Vitals:   02/14/20 0855 02/14/20 1907 02/14/20 2203 02/15/20 0524  BP: 115/74 (!) 142/66 139/66 130/69  Pulse: (!) 104 96 (!) 102 (!) 104  Resp: 20 18  20   Temp: (!) 97.5 F (36.4 C) 97.7 F (36.5 C) 99.6 F (37.6 C) 99.8 F (37.7 C)  TempSrc: Axillary  Oral Oral  SpO2: 97% 100% 100% 98%  Weight:      Height:         PHYSICAL EXAM: General: Pleasant patient, nontoxic-appearing, appears winded while walking with physical therapy Resp: Minimal crackles appreciated to right lower lobe, increased work of breathing after walking with physical therapy, satting 98% Cardiac: Regular rhythm, heart rate 109 bpm after ambulating, no murmurs appreciated Abd: Soft, nontender, normal bowel sounds appreciated Extremities: 2+ pitting edema appreciated to bilateral feet, 1+ pitting edema appreciated to anterior shins up to hips and abdominal wall, otherwise no deformity or ecchymosis.  Labs: Basic Metabolic Panel: Recent Labs  Lab 02/11/20 1222 02/12/20 0357 02/13/20 0317 02/14/20 0617 02/15/20 0540  NA 129* 128* 128* 127* 128*  K 4.2 3.9 3.6 4.0 3.6  CL 95* 93* 94* 93* 93*  CO2 26 24 23 24 25   GLUCOSE 108* 110* 114* 125* 115*  BUN 20 20 23  26* 35*  CREATININE 1.23 1.43* 1.28* 1.47* 1.42*  ALBUMIN 1.4*  --  1.2* 1.2* 1.1*  CALCIUM 7.9* 7.9* 7.5* 7.5* 7.4*   Liver Function Tests: Recent Labs  Lab 02/13/20 0317 02/14/20 0617 02/15/20 0540  AST 37 54* 38  ALT 25 38 34  ALKPHOS 184* 174* 165*  BILITOT 0.8 0.7 0.8  PROT 5.9* 6.3* 6.2*  ALBUMIN 1.2* 1.2* 1.1*   No results for input(s): LIPASE, AMYLASE in the last 168 hours. No results for input(s): AMMONIA in the last 168  hours. CBC: Recent Labs  Lab 02/11/20 1222 02/11/20 1222 02/12/20 0357 02/12/20 0357 02/13/20 0317 02/13/20 2227 02/14/20  0617 02/15/20 0540  WBC 18.5*   < > 23.4*  --  21.2*  --  23.8* 24.3*  NEUTROABS 14.3*  --   --   --  16.6*  --  18.4* 18.5*  HGB 8.0*   < > 8.4*   < > 7.7* 8.1* 9.5* 8.8*  HCT 26.6*   < > 27.4*   < > 24.8* 25.2* 30.2* 27.9*  MCV 88.4   < > 87.5  --  85.5  --  85.8 85.3  PLT 421*   < > 493*  --  422*  --  428* 458*   < > = values in this interval not displayed.   PT/INR: @LABRCNTIP (inr:5) Cardiac Enzymes: ) Recent Labs  Lab 02/15/20 1054  CKTOTAL 11*   CBG: No results for input(s): GLUCAP in the last 168 hours.  Iron Studies:  Recent Labs  Lab 02/11/20 2126  IRON 18*  TIBC 133*  FERRITIN 1,812*    Xrays/Other Studies: No results found.   Assessment/Plan: 1. Hyponatremia: Hyponatremia this morning 128, chloride also decreased at 93. Most likely due to fluid overload, initiated Lasix 40mg  BID PO. Is hypervolemic hyponatremia 2. AKI: Baseline creatinine 0.99, elevated at 1.47 this morning; inflammatory markers CRP, sed rate, platelets are elevated.  Additionally, patient has increased WBC count.  Currently, labs have been ordered to evaluate kidney function including ANCA, rheumatoid factor, ANA, sed rate, ANCA titers, myeloperoxidase, C3, and C4.  -Add in protein/creatinine ratio, UPEP, SPEP, Kappa/Lambda light chains 3. Hypoalbuminemia: Patient's albumin this admission 1.1, patient noted to have 100 protein in urine, has had 30 protein in urine in the past, however low albumin precedes protein in UA.  4. Lower extremity swelling: New onset in February 2021, patient's recent echo in April 2021 negative for HFrEF/HFpEF, BNP slightly elevated at 149. Edema stable at 1-2+ bilaterally. 5. Hypertension: Blood pressure this admission 130/69. Continue hydralazine 5 mg 4 times daily as needed 6. Anemia: Patient's hemoglobin this admission around  8.4>1uPRBC>8.8. Had colonoscopy and EGD performed April 2021 without any significant findings or sites of bleeding. Patient prescribed iron 325 twice daily, folic acid. 7. Recent community-acquired pneumonia  leukocytosis: Patient with persistently elevated leukocytosis at 21K+. Patient started on Steroids prednisone 50mg  daily today 02/15/2020. Elevated procalcitonin 0.7, patient continues to take antibiotics metronidazole and azithromycin. 8. Hypocalcemia: Ca 7.4, however Albumin 1.1, corrected = 9.7 (normal)     May 2021 02/15/2020, 3:20 PM   Patient seen and examined, agree with above note with above modifications. Pt with some kind of mystery illness that has caused pt to decline in health over the last 3 months.  Features include anemia and leukocytosis associated with decreased appetite with resultant hypoalbuminemia giving edema and hyponatremia.  He has proteinuria on U/A but his hypoalbuminemia predates this finding.  He also has developed some mild CKD recently  but difficult to know what that is due to.  He does have some microscopic hematuria as well as low level proteinuria.  Would not really consider a renal biopsy right at this time and would need to talk with pts family (pts son and daughter in law are physicians)  before pursuing just due to his age and current condition.  Will quantify protein in urine and start him on lasix for the edema and hyponatremia.  Oncology has sent off serologies for a vasculitis and started moderate dose steroids as well.   02/17/2020, MD 02/15/2020

## 2020-02-15 NOTE — Progress Notes (Signed)
Nutrition Follow-up  DOCUMENTATION CODES:   Not applicable  INTERVENTION:  Continue Ensure Enlive po TID, each supplement provides 350 kcal and 20 grams of protein  Continue Magic cup TID with meals, each supplement provides 290 kcal and 9 grams of protein  Continue 21ml Pro-stat TID  NUTRITION DIAGNOSIS:   Inadequate oral intake related to poor appetite as evidenced by (per chart review).  Ongoing.  GOAL:   Patient will meet greater than or equal to 90% of their needs  Progressing.  MONITOR:   PO intake, Supplement acceptance, Weight trends  REASON FOR ASSESSMENT:   Consult Malnutrition Eval  ASSESSMENT:   Pt with PMH of R eye blindness, carotid artery occlusion, HTN, and HLD who was recently admitted 3/28 - 3/30 for CAP with demand ischemia. Pt again hospitalized 4/20 - 4/23 for erosive esophagitis and diverticulosis. He is now admitted with failure to thrive (progressive weakness, worsening anemia, hyponatremia, and excessive fatigue).  Pt unavailable at time of RD visit. Discussed pt with RN who reports pt consuming supplements well, including pro-stat.  PO Intake: 0-75% x last 8 recorded meals (48% average meal intake)  UOP: 1,318ml x24 hours I/O: -288.74ml since admit  Labs: Na 128 (L)  Medications reviewed and include: colace, Ensure Enlive TID, 34ml Pro-stat TID, ferrous sulfate, folvite, lasix, deltasone  NUTRITION - FOCUSED PHYSICAL EXAM:  Deferred as pt is unavailable; will attempt at follow-up  Diet Order:   Diet Order            Diet regular Room service appropriate? Yes; Fluid consistency: Thin  Diet effective now              EDUCATION NEEDS:   No education needs have been identified at this time  Skin:  Skin Assessment: Reviewed RN Assessment  Last BM:  5/13  Height:   Ht Readings from Last 1 Encounters:  02/11/20 6' (1.829 m)    Weight:   Wt Readings from Last 1 Encounters:  02/12/20 98.1 kg   BMI:  Body mass index is  29.33 kg/m.  Estimated Nutritional Needs:   Kcal:  2100-2400  Protein:  115-130 grams  Fluid:  2 L/day   Eugene Gavia, MS, RD, LDN RD pager number and weekend/on-call pager number located in Amion.

## 2020-02-15 NOTE — Progress Notes (Signed)
Physical Therapy Treatment Patient Details Name: Jeffrey Ho MRN: [id number] DOB: 1935/03/30 Today's Date: 02/15/2020    History of Present Illness Jeffrey Ho is a 85 y.o. male with medical history significant of R eye blindness; carotid artery occlusion; HTN; and HLD.  He was admitted from 3/28-30 for CAP with demand ischemia; he was discharged on Augmentin.  During that admission, he had heme positive stools but had stable Hgb and no overt bleeding and so was planned for outpatient GI f/u.  He returned from 4/20-23 with symptomatic anemia and hyponatremia.  EGD/colonoscopy performed with reflux erosive esophagitis and diverticulosis/polyps, he was subsequently discharged home presented back to the hospital again on 02/11/2020 with generalized weakness, worsening anemia, hyponatremia and excessive fatigue and was admitted to the hospital for further work-up.    PT Comments    Pt making steady progress with functional mobility as indicated by his ability to ambulate a further distance and requiring less physical assistance overall. However, continue to feel that pt would greatly benefit from further intensive therapy services at a SNF to maximize his independence with functional mobility prior to returning home with his spouse. Pt's wife present throughout and in agreement with plan. PT will continue to follow acutely to progress mobility as tolerated per PT POC.     Follow Up Recommendations  SNF     Equipment Recommendations  None recommended by PT    Recommendations for Other Services       Precautions / Restrictions Precautions Precautions: Fall Precaution Comments: Fatigues quickly Restrictions Weight Bearing Restrictions: No    Mobility  Bed Mobility               General bed mobility comments: OOB in recliner at beginning and end of session  Transfers Overall transfer level: Needs assistance Equipment used: Rolling walker (2 wheeled) Transfers: Sit to/from  Stand Sit to Stand: Min guard;+2 safety/equipment         General transfer comment: vc for safe hand placement but no physical assist needed  Ambulation/Gait Ambulation/Gait assistance: Min guard Gait Distance (Feet): 75 Feet(75' x2 with sitting rest break of several minutes in between) Assistive device: Rolling walker (2 wheeled) Gait Pattern/deviations: Decreased step length - right;Step-through pattern;Decreased step length - left;Decreased stride length;Trunk flexed Gait velocity: decreased   General Gait Details: frequent cueing to maintain proximity to RW; pt steady overall but fatigues quickly   Stairs             Wheelchair Mobility    Modified Rankin (Stroke Patients Only)       Balance Overall balance assessment: Needs assistance Sitting-balance support: Feet supported;No upper extremity supported Sitting balance-Leahy Scale: Fair     Standing balance support: During functional activity;Bilateral upper extremity supported Standing balance-Leahy Scale: Poor Standing balance comment: requires RW                            Cognition Arousal/Alertness: Awake/alert Behavior During Therapy: WFL for tasks assessed/performed Overall Cognitive Status: Within Functional Limits for tasks assessed                                        Exercises      General Comments General comments (skin integrity, edema, etc.): wife present throughout session      Pertinent Vitals/Pain Pain Assessment: No/denies pain    Home Living  Prior Function            PT Goals (current goals can now be found in the care plan section) Acute Rehab PT Goals Patient Stated Goal: Get stronger PT Goal Formulation: With patient/family Time For Goal Achievement: 02/26/20 Potential to Achieve Goals: Fair Progress towards PT goals: Progressing toward goals    Frequency    Min 2X/week      PT Plan Current plan  remains appropriate    Co-evaluation PT/OT/SLP Co-Evaluation/Treatment: Yes Reason for Co-Treatment: To address functional/ADL transfers;For patient/therapist safety PT goals addressed during session: Mobility/safety with mobility;Balance;Proper use of DME;Strengthening/ROM OT goals addressed during session: ADL's and self-care;Proper use of Adaptive equipment and DME      AM-PAC PT "6 Clicks" Mobility   Outcome Measure  Help needed turning from your back to your side while in a flat bed without using bedrails?: None Help needed moving from lying on your back to sitting on the side of a flat bed without using bedrails?: A Little Help needed moving to and from a bed to a chair (including a wheelchair)?: A Little Help needed standing up from a chair using your arms (e.g., wheelchair or bedside chair)?: A Little Help needed to walk in hospital room?: A Little Help needed climbing 3-5 steps with a railing? : A Lot 6 Click Score: 18    End of Session Equipment Utilized During Treatment: Gait belt Activity Tolerance: Patient tolerated treatment well Patient left: in chair;with call bell/phone within reach;with chair alarm set;with family/visitor present Nurse Communication: Mobility status PT Visit Diagnosis: Unsteadiness on feet (R26.81);Other abnormalities of gait and mobility (R26.89);Muscle weakness (generalized) (M62.81)     Time: 4818-5631 PT Time Calculation (min) (ACUTE ONLY): 24 min  Charges:  $Therapeutic Activity: 8-22 mins                     Anastasio Champion, DPT  Acute Rehabilitation Services Pager [phone] Office [place] 02/15/2020, 12:44 PM

## 2020-02-15 NOTE — Progress Notes (Signed)
  Speech Language Pathology Treatment: Dysphagia  Patient Details Name: MATAS BURROWS MRN: [id number] DOB: [birth year] Today's Date: 02/15/2020 Time: 5809-9833 SLP Time Calculation (min) (ACUTE ONLY): 22 min  Assessment / Plan / Recommendation Clinical Impression  Followed up this am for education. Pts food choices were too limited by dys 2 diet, so pt and I decided to resume a regualr diet with pt and wife making food choices. Reiterated basic suggestions for appetite, dry mouth, oral hygiene products, saliva stimulants. Also discussed choosing the most calorie dense foods that are moist, soft and appetizing. All education complete at this time. Will sign off  HPI HPI: 85 y.o. [place], [place] male with persistent leukocytosis and anemia.  He has failure to thrive with progressive physical decline as well as anorexia and early satiety.      SLP Plan  All goals met       Recommendations  Diet recommendations: Regular;Thin liquid Liquids provided via: Cup;Straw Medication Administration: Whole meds with puree                Follow up Recommendations: Skilled Nursing facility Plan: All goals met       GO                Rayhana Slider, Katherene Ponto 02/15/2020, 9:36 AM

## 2020-02-15 NOTE — Progress Notes (Signed)
PROGRESS NOTE                                                                                                                                                                                                             Patient Demographics:    Jeffrey Ho, is a 85 y.o. male, DOB - Jun 11, 1935, NAT:[id number]  Admit date - 02/11/2020   Admitting Physician Karmen Bongo, MD  Outpatient Primary MD for the patient is Redmond School, MD  LOS - 4  Chief Complaint  Patient presents with  . Weakness       Brief Narrative - Jeffrey Ho is a 85 y.o. male with medical history significant of R eye blindness; carotid artery occlusion; HTN; and HLD.  He was admitted from 3/28-30 for CAP with demand ischemia; he was discharged on Augmentin.  During that admission, he had heme positive stools but had stable Hgb and no overt bleeding and so was planned for outpatient GI f/u.  He returned from 4/20-23 with symptomatic anemia and hyponatremia.  EGD/colonoscopy performed with reflux erosive esophagitis and diverticulosis/polyps, he was subsequently discharged home presented back to the hospital again on 02/11/2020 with generalized weakness, worsening anemia, hyponatremia and excessive fatigue and was admitted to the hospital for further work-up.   Subjective:   Patient in bed, appears comfortable, denies any headache, no fever, no chest pain or pressure, no shortness of breath , no abdominal pain. No focal weakness.   Assessment  & Plan :     1.  Failure to thrive, persistent fatigue and deconditioning with symptomatic recurrent anemia of chronic disease and leukocytosis. he now appears to be hemodynamically stable, seen by hematology oncology, CT chest abdomen pelvis nonacute with no suspicion for malignancy.  His decline seems to be age-related although there could be a small possibility of a lingering infection for which we will challenge him with 7 days of azithromycin and Flagyl since he  has history of pneumonia and diverticulosis.  He has no abdominal pain or productive cough.  Of note he had recent EGD and colonoscopy in [place] by Dr. Gala Romney which was nonacute as well.  He received 1 unit of packed RBC by hematology on 02/13/2020 after which his H&H appears to be stable.  No signs of ongoing bleeding.  After detailed discussions with wife it appears that his main symptoms are of excessive fatigue, question if he has underlying PMR, will check ANA, rheumatoid factor, ANCA, CK levels along with ESR, will give him  a therapeutic trial of prednisone and monitor.   2.  Deconditioning.  PT OT, may require placement.  MRI brain nonacute but does show atrophy.  3.  Hyponatremia with fluid overload and edema.  Likely due to third spacing of fluids due to severe protein calorie malnutrition, recent echo noted with preserved EF and mild diastolic dysfunction, no pulmonary edema hence I do not think this is CHF.  Unfortunately with Lasix renal function started to decline so off the second dose will hold.  Protein supplementations being provided.  4.  Severe PCM causing third spacing of fluid and edema.  Has been placed on protein supplements, LFTs appear stable with stable bilirubin, UA has mild proteinuria.  Outpatient nephrology follow-up would suffice.  5.  AKI.  This is likely due to contrast nephropathy he received IV contrast for his CT abdomen and pelvis, proved after gentle hydration on 02/13/2020, slightly worse again will hydrate gently on 02/14/2020.  Extremely difficult situation as he has third space due to severe protein calorie malnutrition and is hyponatremic due to it but not tolerating diuresis at all.  6.  Dyslipidemia continue home dose statin.  7.  HX of pneumonia with lung scarring.  CT noncontrast stable see #1 above.  8.  Diverticulosis.  Stable.  Nonacute CT abdomen pelvis.  See #1 above and monitor.  No pain or diarrhea.    Condition - Fair  Family  Communication  :   Message left for wife [name] on [phone] on 02/12/2020 at 10:45 AM.   Wife updated bedside 02/13/20, 02/14/20   Code Status :  DNR  Consults  :  Onc, Renal  Disposition Plan  :    Status is: Inpatient  Remains inpatient appropriate because:Ongoing diagnostic testing needed not appropriate for outpatient work up   Dispo: The patient is from: Home              Anticipated d/c is to: SNF              Anticipated d/c date is: 1-2 days              Patient currently is not medically stable to d/c.  Had recurrent admissions for failure to thrive and fatigue, hematological work-up is being done to rule out underlying malignancy.   Procedures  :    CT Chest - Non acute  CT abdomen and pelvis with contrast -  1. Colonic diverticulosis, without radiographic evidence of diverticulitis or other acute findings. 2. Cholelithiasis. No radiographic evidence of cholecystitis. 3. Tiny bilateral pleural effusions. Aortic Atherosclerosis  MRI brain - Non acute   PUD Prophylaxis : PPI  DVT Prophylaxis  :  Lovenox   Lab Results  Component Value Date   PLT 458 (H) 02/15/2020    Diet :  Diet Order            Diet regular Room service appropriate? Yes; Fluid consistency: Thin  Diet effective now               Inpatient Medications Scheduled Meds: . sodium chloride   Intravenous Once  . atorvastatin  20 mg Oral Daily  . azithromycin  250 mg Oral Daily  . docusate sodium  100 mg Oral BID  . enoxaparin (LOVENOX) injection  40 mg Subcutaneous Q24H  . feeding supplement (ENSURE ENLIVE)  237 mL Oral TID BM  . feeding supplement (PRO-STAT SUGAR FREE 64)  30 mL Oral TID WC  . ferrous sulfate  325 mg Oral  BID WC  . folic acid  1 mg Oral Daily  . pantoprazole  40 mg Oral Daily  . predniSONE  50 mg Oral Q breakfast   Continuous Infusions: . metronidazole 500 mg (02/15/20 0850)   PRN Meds:.acetaminophen **OR** [DISCONTINUED] acetaminophen, bisacodyl, hydrALAZINE,  [DISCONTINUED] ondansetron **OR** ondansetron (ZOFRAN) IV, phenol-menthol, polyethylene glycol, sodium chloride, zolpidem  Antibiotics  :   Anti-infectives (From admission, onward)   Start     Dose/Rate Route Frequency Ordered Stop   02/13/20 1000  azithromycin (ZITHROMAX) tablet 250 mg     250 mg Oral Daily 02/13/20 0811 02/20/20 0959   02/13/20 0900  metroNIDAZOLE (FLAGYL) IVPB 500 mg     500 mg 100 mL/hr over 60 Minutes Intravenous Every 8 hours 02/13/20 0811 02/20/20 0859          Objective:   Vitals:   02/14/20 0855 02/14/20 1907 02/14/20 2203 02/15/20 0524  BP: 115/74 (!) 142/66 139/66 130/69  Pulse: (!) 104 96 (!) 102 (!) 104  Resp: 20 18  20   Temp: (!) 97.5 F (36.4 C) 97.7 F (36.5 C) 99.6 F (37.6 C) 99.8 F (37.7 C)  TempSrc: Axillary  Oral Oral  SpO2: 97% 100% 100% 98%  Weight:      Height:        SpO2: 98 %  Wt Readings from Last 3 Encounters:  02/12/20 98.1 kg  01/23/20 97.5 kg  01/01/20 102 kg     Intake/Output Summary (Last 24 hours) at 02/15/2020 1024 Last data filed at 02/15/2020 0900 Gross per 24 hour  Intake 2415.57 ml  Output 675 ml  Net 1740.57 ml     Physical Exam  Awake Alert, No new F.N deficits, bilateral hearing loss, 1+ edema with TED stockings in place, [place].AT,PERRAL Supple Neck,No JVD, No cervical lymphadenopathy appriciated.  Symmetrical Chest wall movement, Good air movement bilaterally, CTAB RRR,No Gallops, Rubs or new Murmurs, No Parasternal Heave +ve B.Sounds, Abd Soft, No tenderness, No organomegaly appriciated, No rebound - guarding or rigidity. No Cyanosis, Clubbing or edema, No new Rash or bruise    Data Review:    Recent Labs  Lab 02/11/20 1222 02/11/20 1222 02/12/20 0357 02/13/20 0317 02/13/20 2227 02/14/20 0617 02/15/20 0540  WBC 18.5*  --  23.4* 21.2*  --  23.8* 24.3*  HGB 8.0*   < > 8.4* 7.7* 8.1* 9.5* 8.8*  HCT 26.6*   < > 27.4* 24.8* 25.2* 30.2* 27.9*  PLT 421*  --  493* 422*  --  428* 458*  MCV  88.4  --  87.5 85.5  --  85.8 85.3  MCH 26.6  --  26.8 26.6  --  27.0 26.9  MCHC 30.1  --  30.7 31.0  --  31.5 31.5  RDW 15.2  --  15.4 15.3  --  15.2 15.3  LYMPHSABS 2.4  --   --  2.5  --  2.8 3.1  MONOABS 1.2*  --   --  1.5*  --  1.4* 1.5*  EOSABS 0.4  --   --  0.3  --  0.8* 0.8*  BASOSABS 0.1  --   --  0.1  --  0.1 0.1   < > = values in this interval not displayed.    Recent Labs  Lab 02/11/20 1222 02/12/20 0357 02/12/20 0739 02/13/20 0317 02/14/20 0617 02/15/20 0540  NA 129* 128*  --  128* 127* 128*  K 4.2 3.9  --  3.6 4.0 3.6  CL 95* 93*  --  94* 93* 93*  CO2 26 24  --  23 24 25   GLUCOSE 108* 110*  --  114* 125* 115*  BUN 20 20  --  23 26* 35*  CREATININE 1.23 1.43*  --  1.28* 1.47* 1.42*  CALCIUM 7.9* 7.9*  --  7.5* 7.5* 7.4*  AST 25  --   --  37 54* 38  ALT 24  --   --  25 38 34  ALKPHOS 190*  --   --  184* 174* 165*  BILITOT 0.6  --   --  0.8 0.7 0.8  ALBUMIN 1.4*  --   --  1.2* 1.2* 1.1*  MG  --   --   --  1.7 1.8 1.8  CRP  --   --   --  19.9* 18.5* 16.9*  PROCALCITON  --   --  0.53 0.58 0.54 0.73  TSH  --   --  2.638  --   --   --   BNP 191.8*  --   --  159.4* 198.6* 149.3*    Recent Labs  Lab 02/11/20 1222 02/11/20 1601 02/12/20 0739 02/13/20 0317 02/14/20 0617 02/15/20 0540  CRP  --   --   --  19.9* 18.5* 16.9*  BNP 191.8*  --   --  159.4* 198.6* 149.3*  PROCALCITON  --   --  0.53 0.58 0.54 0.73  SARSCOV2NAA  --  NEGATIVE  --   --   --   --     ------------------------------------------------------------------------------------------------------------------ No results for input(s): CHOL, HDL, LDLCALC, TRIG, CHOLHDL, LDLDIRECT in the last 72 hours.  Lab Results  Component Value Date   HGBA1C 6.2 (H) 01/01/2020   ------------------------------------------------------------------------------------------------------------------ No results for input(s): TSH, T4TOTAL, T3FREE, THYROIDAB in the last 72 hours.  Invalid input(s):  FREET3 ------------------------------------------------------------------------------------------------------------------ No results for input(s): VITAMINB12, FOLATE, FERRITIN, TIBC, IRON, RETICCTPCT in the last 72 hours.  Coagulation profile No results for input(s): INR, PROTIME in the last 168 hours.  No results for input(s): DDIMER in the last 72 hours.  Cardiac Enzymes No results for input(s): CKMB, TROPONINI, MYOGLOBIN in the last 168 hours.  Invalid input(s): CK ------------------------------------------------------------------------------------------------------------------    Component Value Date/Time   BNP 149.3 (H) 02/15/2020 0540    Micro Results Recent Results (from the past 240 hour(s))  Respiratory Panel by RT PCR (Flu A&B, Covid) - Nasopharyngeal Swab     Status: None   Collection Time: 02/11/20  4:01 PM   Specimen: Nasopharyngeal Swab  Result Value Ref Range Status   SARS Coronavirus 2 by RT PCR NEGATIVE NEGATIVE Final    Comment: (NOTE) SARS-CoV-2 target nucleic acids are NOT DETECTED. The SARS-CoV-2 RNA is generally detectable in upper respiratoy specimens during the acute phase of infection. The lowest concentration of SARS-CoV-2 viral copies this assay can detect is 131 copies/mL. A negative result does not preclude SARS-Cov-2 infection and should not be used as the sole basis for treatment or other patient management decisions. A negative result may occur with  improper specimen collection/handling, submission of specimen other than nasopharyngeal swab, presence of viral mutation(s) within the areas targeted by this assay, and inadequate number of viral copies (<131 copies/mL). A negative result must be combined with clinical observations, patient history, and epidemiological information. The expected result is Negative. Fact Sheet for Patients:  PinkCheek.be Fact Sheet for Healthcare Providers:   GravelBags.it This test is not yet ap proved or cleared by the [place] and  has been authorized for  detection and/or diagnosis of SARS-CoV-2 by FDA under an Emergency Use Authorization (EUA). This EUA will remain  in effect (meaning this test can be used) for the duration of the COVID-19 declaration under Section 564(b)(1) of the Act, 21 U.S.C. section 360bbb-3(b)(1), unless the authorization is terminated or revoked sooner.    Influenza A by PCR NEGATIVE NEGATIVE Final   Influenza B by PCR NEGATIVE NEGATIVE Final    Comment: (NOTE) The Xpert Xpress SARS-CoV-2/FLU/RSV assay is intended as an aid in  the diagnosis of influenza from Nasopharyngeal swab specimens and  should not be used as a sole basis for treatment. Nasal washings and  aspirates are unacceptable for Xpert Xpress SARS-CoV-2/FLU/RSV  testing. Fact Sheet for Patients: PinkCheek.be Fact Sheet for Healthcare Providers: GravelBags.it This test is not yet approved or cleared by the [place] FDA and  has been authorized for detection and/or diagnosis of SARS-CoV-2 by  FDA under an Emergency Use Authorization (EUA). This EUA will remain  in effect (meaning this test can be used) for the duration of the  Covid-19 declaration under Section 564(b)(1) of the Act, 21  U.S.C. section 360bbb-3(b)(1), unless the authorization is  terminated or revoked. Performed at [facility], [place] [address], [place], [place] [address]   Culture, blood (routine x 2)     Status: None (Preliminary result)   Collection Time: 02/11/20  9:26 PM   Specimen: BLOOD  Result Value Ref Range Status   Specimen Description BLOOD LEFT ANTECUBITAL  Final   Special Requests   Final    BOTTLES DRAWN AEROBIC AND ANAEROBIC Blood Culture adequate volume   Culture   Final    NO GROWTH 4 DAYS Performed at [facility], [place] [address], [place],  [place] [address]    Report Status PENDING  Incomplete  Culture, blood (routine x 2)     Status: None (Preliminary result)   Collection Time: 02/11/20  9:53 PM   Specimen: BLOOD RIGHT HAND  Result Value Ref Range Status   Specimen Description BLOOD RIGHT HAND  Final   Special Requests   Final    BOTTLES DRAWN AEROBIC ONLY Blood Culture adequate volume   Culture   Final    NO GROWTH 4 DAYS Performed at [facility], [place] [address] [place], [place] [address]    Report Status PENDING  Incomplete    Radiology Reports DG Chest 2 View  Result Date: 02/11/2020 CLINICAL DATA:  Shortness of breath EXAM: CHEST - 2 VIEW COMPARISON:  January 23, 2020 FINDINGS: There is either scarring in the lateral right base or small right pleural effusion. There is apparent scarring in the right mid lung as well. Lungs elsewhere are clear. Heart size and pulmonary vascularity are normal. There is aortic atherosclerosis. No adenopathy. There is degenerative change in the thoracic spine. IMPRESSION: Scarring right mid lung. Question scarring lateral right base versus small right pleural effusion. Lungs elsewhere clear. Stable cardiac silhouette.  Aortic Atherosclerosis (ICD10-I70.0). Electronically Signed   By: Lowella Grip III M.D.   On: 02/11/2020 11:40   DG Chest 2 View  Result Date: 01/21/2020 CLINICAL DATA:  Pneumonia 2 months ago. Dry cough. EXAM: CHEST - 2 VIEW COMPARISON:  Chest x-ray dated 12/25/2019. FINDINGS: Improved aeration at the RIGHT lung base/costophrenic angle. No new opacity. Lungs are hyperexpanded. Chronic bronchitic changes noted centrally. Heart size and mediastinal contours are within normal limits. No acute or suspicious osseous finding. IMPRESSION: 1. No active cardiopulmonary disease. No  evidence of pneumonia or pulmonary edema on today's exam. 2. Improved aeration at the RIGHT lung base compared to the previous chest x-ray of 12/25/2019. 3. Hyperexpanded lungs indicating COPD. Associated  chronic bronchitic changes centrally. Electronically Signed   By: Franki Cabot M.D.   On: 01/21/2020 17:09   CT CHEST WO CONTRAST  Result Date: 02/12/2020 CLINICAL DATA:  Cancer of unknown primary. EXAM: CT CHEST WITHOUT CONTRAST TECHNIQUE: Multidetector CT imaging of the chest was performed following the standard protocol without IV contrast. COMPARISON:  January 23, 2020. FINDINGS: Cardiovascular: Atherosclerosis of thoracic aorta is noted without aneurysm formation. Normal cardiac size. No pericardial effusion. Coronary artery calcifications are noted. Mediastinum/Nodes: No enlarged mediastinal or axillary lymph nodes. Thyroid gland, trachea, and esophagus demonstrate no significant findings. Lungs/Pleura: No pneumothorax is noted. Minimal right pleural effusion is noted. Minimal bilateral posterior basilar subsegmental atelectasis is noted. Stable left apical scarring is noted. Stable right middle lobe scarring is noted. Stable right posterior upper lobe opacity is noted concerning for pneumonia or possibly scarring. Upper Abdomen: No acute abnormality. Musculoskeletal: No chest wall mass or suspicious bone lesions identified. IMPRESSION: 1. Coronary artery calcifications are noted suggesting coronary artery disease. 2. Minimal right pleural effusion is noted. 3. Stable right posterior upper lobe opacity is noted concerning for pneumonia or possibly scarring. Stable right middle lobe scarring is noted. 4. Stable left apical scarring is noted. 5. Minimal bilateral posterior basilar subsegmental atelectasis is noted. Aortic Atherosclerosis (ICD10-I70.0). Electronically Signed   By: Marijo Conception M.D.   On: 02/12/2020 11:46   CT Chest W Contrast  Result Date: 01/23/2020 CLINICAL DATA:  85 year old male with generalized weakness. EXAM: CT CHEST WITH CONTRAST TECHNIQUE: Multidetector CT imaging of the chest was performed during intravenous contrast administration. CONTRAST:  64m OMNIPAQUE IOHEXOL 300 MG/ML   SOLN COMPARISON:  Chest radiograph dated 01/23/2020. Chest CT dated 12/29/2019 FINDINGS: Cardiovascular: There is no cardiomegaly or pericardial effusion. Coronary vascular calcification primarily involving the LAD and left circumflex artery. There is moderate atherosclerotic calcification of the thoracic aorta. No aneurysmal dilatation or dissection. Evaluation of the pulmonary arteries is limited due to suboptimal opacification. No definite pulmonary artery embolus identified. Mediastinum/Nodes: There is no hilar or mediastinal adenopathy. Right hilar lymph node measures 7 mm in short axis. The esophagus is grossly unremarkable. No mediastinal fluid collection. Lungs/Pleura: There is a small right pleural effusion, decreased in size since the prior CT. Right upper lobe streaky and nodular densities most consistent with pneumonia. Overall improvement of the right upper lobe densities since the prior CT. Clinical correlation and continued follow-up recommended. Additional nodular densities in the left apex and left lower lobe. There is no pneumothorax. The central airways are patent. There is mild bronchial wall thickening most consistent with bronchitis. Biapical scarring noted. Upper Abdomen: Small gallstones. Musculoskeletal: Degenerative changes of the spine. No acute osseous pathology. IMPRESSION: 1. Right upper lobe pneumonia, improved since the prior CT. Clinical correlation and continued follow-up recommended. 2. Small right pleural effusion, decreased in size since the prior CT. 3. Mild bronchial wall thickening most consistent with bronchitis. 4. Cholelithiasis. 5. Aortic Atherosclerosis (ICD10-I70.0). Electronically Signed   By: AAnner CreteM.D.   On: 01/23/2020 17:23   MR BRAIN WO CONTRAST  Result Date: 02/11/2020 CLINICAL DATA:  Subacute neuro deficit EXAM: MRI HEAD WITHOUT CONTRAST TECHNIQUE: Multiplanar, multiecho pulse sequences of the brain and surrounding structures were obtained without  intravenous contrast. COMPARISON:  02/17/2009 FINDINGS: Brain: No acute infarction, hemorrhage, hydrocephalus, extra-axial collection  or mass lesion. Cerebral volume loss and ventriculomegaly that is progressed from 2010. No significant ischemic changes. Vascular: Normal flow voids Skull and upper cervical spine: Normal marrow signal Sinuses/Orbits: Marked atrophy of the right optic nerve compared to the left, see coronal reformats. There is history of blindness on the right per the chart. Bilateral mastoid opacification. Left sphenoid sinus opacification. IMPRESSION: 1. No acute or reversible finding. 2. Brain atrophy that has progressed from 2010. 3. Left sphenoid sinus and bilateral mastoid opacification with negative nasopharynx. Electronically Signed   By: Monte Fantasia M.D.   On: 02/11/2020 19:17   CT Abdomen Pelvis W Contrast  Result Date: 02/11/2020 CLINICAL DATA:  Nausea and vomiting. Weakness. Recent pneumonia EXAM: CT ABDOMEN AND PELVIS WITH CONTRAST TECHNIQUE: Multidetector CT imaging of the abdomen and pelvis was performed using the standard protocol following bolus administration of intravenous contrast. CONTRAST:  174m OMNIPAQUE IOHEXOL 300 MG/ML  SOLN COMPARISON:  None. FINDINGS: Lower Chest: No acute findings. Tiny pleural effusions noted bilaterally. Hepatobiliary: No hepatic masses identified. A few tiny sub-cm calcified gallstones are seen, however there is no evidence of cholecystitis or biliary dilatation. Pancreas:  No mass or inflammatory changes. Spleen: Within normal limits in size and appearance. Adrenals/Urinary Tract: No masses identified. No evidence of ureteral calculi or hydronephrosis. Stomach/Bowel: No evidence of obstruction, inflammatory process or abnormal fluid collections. Normal appendix visualized. Diverticulosis is seen mainly involving the sigmoid colon, however there is no evidence of diverticulitis. Vascular/Lymphatic: No pathologically enlarged lymph nodes. No  abdominal aortic aneurysm. Aortic atherosclerosis noted. Reproductive:  No mass or other significant abnormality. Other:  None. Musculoskeletal:  No suspicious bone lesions identified. IMPRESSION: 1. Colonic diverticulosis, without radiographic evidence of diverticulitis or other acute findings. 2. Cholelithiasis. No radiographic evidence of cholecystitis. 3. Tiny bilateral pleural effusions. Aortic Atherosclerosis (ICD10-I70.0). Electronically Signed   By: JMarlaine HindM.D.   On: 02/11/2020 14:08   DG Chest Portable 1 View  Result Date: 01/23/2020 CLINICAL DATA:  Progressive weakness. Recent history of pneumonia. EXAM: PORTABLE CHEST 1 VIEW COMPARISON:  Chest x-ray dated 01/19/2020 and chest CT dated 12/29/2019 FINDINGS: The heart size and pulmonary vascularity are normal. Aortic atherosclerosis. Chronic blunting of the right costophrenic angle laterally. Chronic slight parenchymal scarring in the right midzone. Left lung is clear. No acute bone abnormality. Moderate arthritic changes of both glenohumeral joints. IMPRESSION: 1. No acute abnormalities. 2.  Aortic Atherosclerosis (ICD10-I70.0). Electronically Signed   By: JLorriane ShireM.D.   On: 01/23/2020 13:45    Time Spent in minutes  30   PLala LundM.D on 02/15/2020 at 10:24 AM  To page go to [email or web address] - password [email or web address]

## 2020-02-16 LAB — CBC WITH DIFFERENTIAL/PLATELET
Abs Immature Granulocytes: 0.3 10*3/uL — ABNORMAL HIGH (ref 0.00–0.07)
Basophils Absolute: 0 10*3/uL (ref 0.0–0.1)
Basophils Relative: 0 %
Eosinophils Absolute: 0 10*3/uL (ref 0.0–0.5)
Eosinophils Relative: 0 %
HCT: 25.9 % — ABNORMAL LOW (ref 39.0–52.0)
Hemoglobin: 7.9 g/dL — ABNORMAL LOW (ref 13.0–17.0)
Immature Granulocytes: 1 %
Lymphocytes Relative: 13 %
Lymphs Abs: 2.8 10*3/uL (ref 0.7–4.0)
MCH: 26.2 pg (ref 26.0–34.0)
MCHC: 30.5 g/dL (ref 30.0–36.0)
MCV: 86 fL (ref 80.0–100.0)
Monocytes Absolute: 1.2 10*3/uL — ABNORMAL HIGH (ref 0.1–1.0)
Monocytes Relative: 6 %
Neutro Abs: 16.9 10*3/uL — ABNORMAL HIGH (ref 1.7–7.7)
Neutrophils Relative %: 80 %
Platelets: 399 10*3/uL (ref 150–400)
RBC: 3.01 MIL/uL — ABNORMAL LOW (ref 4.22–5.81)
RDW: 15 % (ref 11.5–15.5)
WBC: 21.2 10*3/uL — ABNORMAL HIGH (ref 4.0–10.5)
nRBC: 0 % (ref 0.0–0.2)

## 2020-02-16 LAB — PROTEIN ELECTROPHORESIS, SERUM
A/G Ratio: 0.3 — ABNORMAL LOW (ref 0.7–1.7)
Albumin ELP: 1.5 g/dL — ABNORMAL LOW (ref 2.9–4.4)
Alpha-1-Globulin: 0.6 g/dL — ABNORMAL HIGH (ref 0.0–0.4)
Alpha-2-Globulin: 0.9 g/dL (ref 0.4–1.0)
Beta Globulin: 0.8 g/dL (ref 0.7–1.3)
Gamma Globulin: 2.3 g/dL — ABNORMAL HIGH (ref 0.4–1.8)
Globulin, Total: 4.5 g/dL — ABNORMAL HIGH (ref 2.2–3.9)
Total Protein ELP: 6 g/dL (ref 6.0–8.5)

## 2020-02-16 LAB — COMPREHENSIVE METABOLIC PANEL
ALT: 27 U/L (ref 0–44)
AST: 26 U/L (ref 15–41)
Albumin: 1.2 g/dL — ABNORMAL LOW (ref 3.5–5.0)
Alkaline Phosphatase: 122 U/L (ref 38–126)
Anion gap: 8 (ref 5–15)
BUN: 51 mg/dL — ABNORMAL HIGH (ref 8–23)
CO2: 27 mmol/L (ref 22–32)
Calcium: 7.7 mg/dL — ABNORMAL LOW (ref 8.9–10.3)
Chloride: 94 mmol/L — ABNORMAL LOW (ref 98–111)
Creatinine, Ser: 1.49 mg/dL — ABNORMAL HIGH (ref 0.61–1.24)
GFR calc Af Amer: 50 mL/min — ABNORMAL LOW (ref >60)
GFR calc non Af Amer: 43 mL/min — ABNORMAL LOW (ref >60)
Glucose, Bld: 145 mg/dL — ABNORMAL HIGH (ref 70–99)
Potassium: 3.8 mmol/L (ref 3.5–5.1)
Sodium: 129 mmol/L — ABNORMAL LOW (ref 135–145)
Total Bilirubin: 0.5 mg/dL (ref 0.3–1.2)
Total Protein: 5.8 g/dL — ABNORMAL LOW (ref 6.5–8.1)

## 2020-02-16 LAB — CULTURE, BLOOD (ROUTINE X 2)
Culture: NO GROWTH
Culture: NO GROWTH
Special Requests: ADEQUATE
Special Requests: ADEQUATE

## 2020-02-16 LAB — C3 COMPLEMENT: C3 Complement: 91 mg/dL (ref 82–167)

## 2020-02-16 LAB — ANTINUCLEAR ANTIBODIES, IFA: ANA Ab, IFA: NEGATIVE

## 2020-02-16 LAB — C-REACTIVE PROTEIN: CRP: 14.8 mg/dL — ABNORMAL HIGH (ref <1.0)

## 2020-02-16 LAB — BRAIN NATRIURETIC PEPTIDE: B Natriuretic Peptide: 182.6 pg/mL — ABNORMAL HIGH (ref 0.0–100.0)

## 2020-02-16 LAB — KAPPA/LAMBDA LIGHT CHAINS
Kappa free light chain: 429.9 mg/L — ABNORMAL HIGH (ref 3.3–19.4)
Kappa, lambda light chain ratio: 2.25 — ABNORMAL HIGH (ref 0.26–1.65)
Lambda free light chains: 191.2 mg/L — ABNORMAL HIGH (ref 5.7–26.3)

## 2020-02-16 LAB — MPO/PR-3 (ANCA) ANTIBODIES
ANCA Proteinase 3: <3.5 U/mL (ref 0.0–3.5)
Myeloperoxidase Abs: 39.9 U/mL — ABNORMAL HIGH (ref 0.0–9.0)

## 2020-02-16 LAB — MAGNESIUM: Magnesium: 1.9 mg/dL (ref 1.7–2.4)

## 2020-02-16 LAB — PROCALCITONIN: Procalcitonin: 0.53 ng/mL

## 2020-02-16 LAB — C4 COMPLEMENT: Complement C4, Body Fluid: 27 mg/dL (ref 12–38)

## 2020-02-16 MED ORDER — PREDNISONE 50 MG PO TABS
50.0000 mg | ORAL_TABLET | Freq: Every day | ORAL | Status: DC
  Administered 2020-02-16: 50 mg via ORAL

## 2020-02-16 NOTE — Progress Notes (Signed)
PROGRESS NOTE                                                                                                                                                                                                             Patient Demographics:    Jeffrey Ho, is a 85 y.o. male, DOB - Apr 20, 1935, XLK:[id number]  Admit date - 02/11/2020   Admitting Physician Karmen Bongo, MD  Outpatient Primary MD for the patient is Redmond School, MD  LOS - 5  Chief Complaint  Patient presents with  . Weakness       Brief Narrative - Jeffrey Ho is a 85 y.o. male with medical history significant of R eye blindness; carotid artery occlusion; HTN; and HLD.  He was admitted from 3/28-30 for CAP with demand ischemia; he was discharged on Augmentin.  During that admission, he had heme positive stools but had stable Hgb and no overt bleeding and so was planned for outpatient GI f/u.  He returned from 4/20-23 with symptomatic anemia and hyponatremia.  EGD/colonoscopy performed with reflux erosive esophagitis and diverticulosis/polyps, he was subsequently discharged home presented back to the hospital again on 02/11/2020 with generalized weakness, worsening anemia, hyponatremia and excessive fatigue and was admitted to the hospital for further work-up.   Subjective:   Patient in bed, appears comfortable, denies any headache, no fever, no chest pain or pressure, no shortness of breath , no abdominal pain. No focal weakness. Feels 50% better today.   Assessment  & Plan :     1.  Failure to thrive, excessive, persistent fatigue and deconditioning with symptomatic recurrent anemia of chronic disease, high inflammatory markers including CRP, ESR and leukocytosis. he now appears to be hemodynamically stable, seen by hematology oncology, CT chest abdomen pelvis nonacute with no suspicion for malignancy.  His decline seems to be age-related although there could be a small possibility of a lingering infection  for which we will challenge him with 7 days of azithromycin and Flagyl since he has history of pneumonia and diverticulosis.  He has no abdominal pain or productive cough.  Of note he had recent EGD and colonoscopy in [place] by Dr. Gala Romney which was nonacute as well.  He received 1 unit of packed RBC by hematology on 02/13/2020 after which his H&H appears to be stable.  No signs of ongoing bleeding.  After detailed discussions with wife it appears that his main symptoms are of excessive fatigue, question if he has underlying PMR, will check ANA,  rheumatoid factor, ANCA, CK levels , he has no headache or visual problems.  He was started on a therapeutic trial of oral prednisone on 02/15/2020 and remarkably feels a whole lot better the next day, continue steroids and await results for antibody work-up for vasculitis, trend CRP.   2.  Deconditioning.  PT OT, may require placement.  MRI brain nonacute but does show atrophy.  3.  Hyponatremia with fluid overload and edema.  Likely due to third spacing of fluids due to severe protein calorie malnutrition, recent echo noted with preserved EF and mild diastolic dysfunction, no pulmonary edema hence I do not think this is CHF. Low-dose oral Lasix if renal function can tolerate.  Protein supplementations being provided.  4.  Severe PCM causing third spacing of fluid and edema.  Has been placed on protein supplements, LFTs appear stable with stable bilirubin, UA has mild proteinuria.  Outpatient nephrology follow-up would suffice.  5.  AKI.  This is likely due to contrast nephropathy he received IV contrast for his CT abdomen and pelvis, proved after gentle hydration on 02/13/2020, slightly worse again will hydrate gently on 02/14/2020.  Renal following appreciate their input.  6.  Dyslipidemia continue home dose statin.  7.  HX of pneumonia with lung scarring.  CT noncontrast stable see #1 above.  8.  Diverticulosis.  Stable.  Nonacute CT abdomen pelvis.  See  #1 above and monitor.  No pain or diarrhea.    Condition - Fair  Family Communication  :   Message left for wife [name] on [phone] on 02/12/2020 at 10:45 AM.   Wife updated bedside 02/13/20, 02/14/20, 02/15/20, 02/16/20   Code Status :  DNR  Consults  :  Onc, Renal  Disposition Plan  :    Status is: Inpatient  Remains inpatient appropriate because:Ongoing diagnostic testing needed not appropriate for outpatient work up   Dispo: The patient is from: Home              Anticipated d/c is to: SNF              Anticipated d/c date is: Likely 02/18/2020 if work-up is stable               Patient currently is not medically stable to d/c.  Had recurrent admissions for failure to thrive and fatigue, hematological work-up is being done to rule out underlying malignancy.   Procedures  :    CT Chest - Non acute  CT abdomen and pelvis with contrast -  1. Colonic diverticulosis, without radiographic evidence of diverticulitis or other acute findings. 2. Cholelithiasis. No radiographic evidence of cholecystitis. 3. Tiny bilateral pleural effusions. Aortic Atherosclerosis  MRI brain - Non acute   PUD Prophylaxis : PPI  DVT Prophylaxis  :  Lovenox   Lab Results  Component Value Date   PLT 399 02/16/2020    Diet :  Diet Order            Diet regular Room service appropriate? Yes; Fluid consistency: Thin  Diet effective now               Inpatient Medications Scheduled Meds: . sodium chloride   Intravenous Once  . atorvastatin  20 mg Oral Daily  . azithromycin  250 mg Oral Daily  . docusate sodium  100 mg Oral BID  . enoxaparin (LOVENOX) injection  40 mg Subcutaneous Q24H  . feeding supplement (ENSURE ENLIVE)  237 mL Oral TID BM  . feeding supplement (  PRO-STAT SUGAR FREE 64)  30 mL Oral TID WC  . ferrous sulfate  325 mg Oral BID WC  . folic acid  1 mg Oral Daily  . furosemide  40 mg Oral BID  . metroNIDAZOLE  500 mg Oral Q8H  . pantoprazole  40 mg Oral Daily  .  predniSONE  50 mg Oral Q breakfast   Continuous Infusions:  PRN Meds:.acetaminophen **OR** [DISCONTINUED] acetaminophen, bisacodyl, hydrALAZINE, [DISCONTINUED] ondansetron **OR** ondansetron (ZOFRAN) IV, phenol-menthol, polyethylene glycol, sodium chloride, zolpidem  Antibiotics  :   Anti-infectives (From admission, onward)   Start     Dose/Rate Route Frequency Ordered Stop   02/16/20 0600  metroNIDAZOLE (FLAGYL) tablet 500 mg     500 mg Oral Every 8 hours 02/15/20 1717 02/20/20 1359   02/13/20 1000  azithromycin (ZITHROMAX) tablet 250 mg     250 mg Oral Daily 02/13/20 0811 02/20/20 0959   02/13/20 0900  metroNIDAZOLE (FLAGYL) IVPB 500 mg  Status:  Discontinued     500 mg 100 mL/hr over 60 Minutes Intravenous Every 8 hours 02/13/20 0811 02/15/20 1718          Objective:   Vitals:   02/15/20 1607 02/15/20 2222 02/16/20 0532 02/16/20 0950  BP: 137/70 111/62 129/71 117/63  Pulse: 85 81 75 83  Resp: 18 12 20 18   Temp: 98.2 F (36.8 C) (!) 97.5 F (36.4 C)  (!) 97.5 F (36.4 C)  TempSrc: Oral Oral  Oral  SpO2: 98% 96% 98% 100%  Weight:  98.4 kg    Height:        SpO2: 100 %  Wt Readings from Last 3 Encounters:  02/15/20 98.4 kg  01/23/20 97.5 kg  01/01/20 102 kg     Intake/Output Summary (Last 24 hours) at 02/16/2020 1112 Last data filed at 02/16/2020 0953 Gross per 24 hour  Intake 1449 ml  Output 1515 ml  Net -66 ml     Physical Exam  Awake Alert, No new F.N deficits, bilateral hearing loss, 1+ edema with TED stockings in place, [place].AT,PERRAL Supple Neck,No JVD, No cervical lymphadenopathy appriciated.  Symmetrical Chest wall movement, Good air movement bilaterally, CTAB RRR,No Gallops, Rubs or new Murmurs, No Parasternal Heave +ve B.Sounds, Abd Soft, No tenderness, No organomegaly appriciated, No rebound - guarding or rigidity. No Cyanosis,      Data Review:    Recent Labs  Lab 02/11/20 1222 02/11/20 1222 02/12/20 0357 02/12/20 0357 02/13/20 0317  02/13/20 2227 02/14/20 0617 02/15/20 0540 02/16/20 0548  WBC 18.5*   < > 23.4*  --  21.2*  --  23.8* 24.3* 21.2*  HGB 8.0*   < > 8.4*   < > 7.7* 8.1* 9.5* 8.8* 7.9*  HCT 26.6*   < > 27.4*   < > 24.8* 25.2* 30.2* 27.9* 25.9*  PLT 421*   < > 493*  --  422*  --  428* 458* 399  MCV 88.4   < > 87.5  --  85.5  --  85.8 85.3 86.0  MCH 26.6   < > 26.8  --  26.6  --  27.0 26.9 26.2  MCHC 30.1   < > 30.7  --  31.0  --  31.5 31.5 30.5  RDW 15.2   < > 15.4  --  15.3  --  15.2 15.3 15.0  LYMPHSABS 2.4  --   --   --  2.5  --  2.8 3.1 2.8  MONOABS 1.2*  --   --   --  1.5*  --  1.4* 1.5* 1.2*  EOSABS 0.4  --   --   --  0.3  --  0.8* 0.8* 0.0  BASOSABS 0.1  --   --   --  0.1  --  0.1 0.1 0.0   < > = values in this interval not displayed.    Recent Labs  Lab 02/11/20 1222 02/11/20 1222 02/12/20 0357 02/12/20 0739 02/13/20 0317 02/14/20 0617 02/15/20 0540 02/16/20 0548  NA 129*   < > 128*  --  128* 127* 128* 129*  K 4.2   < > 3.9  --  3.6 4.0 3.6 3.8  CL 95*   < > 93*  --  94* 93* 93* 94*  CO2 26   < > 24  --  23 24 25 27   GLUCOSE 108*   < > 110*  --  114* 125* 115* 145*  BUN 20   < > 20  --  23 26* 35* 51*  CREATININE 1.23   < > 1.43*  --  1.28* 1.47* 1.42* 1.49*  CALCIUM 7.9*   < > 7.9*  --  7.5* 7.5* 7.4* 7.7*  AST 25  --   --   --  37 54* 38 26  ALT 24  --   --   --  25 38 34 27  ALKPHOS 190*  --   --   --  184* 174* 165* 122  BILITOT 0.6  --   --   --  0.8 0.7 0.8 0.5  ALBUMIN 1.4*  --   --   --  1.2* 1.2* 1.1* 1.2*  MG  --   --   --   --  1.7 1.8 1.8 1.9  CRP  --   --   --   --  19.9* 18.5* 16.9* 14.8*  PROCALCITON  --   --   --  0.53 0.58 0.54 0.73 0.53  TSH  --   --   --  2.638  --   --   --   --   BNP 191.8*  --   --   --  159.4* 198.6* 149.3* 182.6*   < > = values in this interval not displayed.    Recent Labs  Lab 02/11/20 1222 02/11/20 1601 02/12/20 0739 02/13/20 0317 02/14/20 0617 02/15/20 0540 02/16/20 0548  CRP  --   --   --  19.9* 18.5* 16.9* 14.8*  BNP  191.8*  --   --  159.4* 198.6* 149.3* 182.6*  PROCALCITON  --   --  0.53 0.58 0.54 0.73 0.53  SARSCOV2NAA  --  NEGATIVE  --   --   --   --   --     ------------------------------------------------------------------------------------------------------------------ No results for input(s): CHOL, HDL, LDLCALC, TRIG, CHOLHDL, LDLDIRECT in the last 72 hours.  Lab Results  Component Value Date   HGBA1C 6.2 (H) 01/01/2020   ------------------------------------------------------------------------------------------------------------------ No results for input(s): TSH, T4TOTAL, T3FREE, THYROIDAB in the last 72 hours.  Invalid input(s): FREET3 ------------------------------------------------------------------------------------------------------------------ No results for input(s): VITAMINB12, FOLATE, FERRITIN, TIBC, IRON, RETICCTPCT in the last 72 hours.  Coagulation profile No results for input(s): INR, PROTIME in the last 168 hours.  No results for input(s): DDIMER in the last 72 hours.  Cardiac Enzymes No results for input(s): CKMB, TROPONINI, MYOGLOBIN in the last 168 hours.  Invalid input(s): CK ------------------------------------------------------------------------------------------------------------------    Component Value Date/Time   BNP 182.6 (H) 02/16/2020 4128    Micro Results Recent Results (from  the past 240 hour(s))  Respiratory Panel by RT PCR (Flu A&B, Covid) - Nasopharyngeal Swab     Status: None   Collection Time: 02/11/20  4:01 PM   Specimen: Nasopharyngeal Swab  Result Value Ref Range Status   SARS Coronavirus 2 by RT PCR NEGATIVE NEGATIVE Final    Comment: (NOTE) SARS-CoV-2 target nucleic acids are NOT DETECTED. The SARS-CoV-2 RNA is generally detectable in upper respiratoy specimens during the acute phase of infection. The lowest concentration of SARS-CoV-2 viral copies this assay can detect is 131 copies/mL. A negative result does not preclude  SARS-Cov-2 infection and should not be used as the sole basis for treatment or other patient management decisions. A negative result may occur with  improper specimen collection/handling, submission of specimen other than nasopharyngeal swab, presence of viral mutation(s) within the areas targeted by this assay, and inadequate number of viral copies (<131 copies/mL). A negative result must be combined with clinical observations, patient history, and epidemiological information. The expected result is Negative. Fact Sheet for Patients:  PinkCheek.be Fact Sheet for Healthcare Providers:  GravelBags.it This test is not yet ap proved or cleared by the [place] FDA and  has been authorized for detection and/or diagnosis of SARS-CoV-2 by FDA under an Emergency Use Authorization (EUA). This EUA will remain  in effect (meaning this test can be used) for the duration of the COVID-19 declaration under Section 564(b)(1) of the Act, 21 U.S.C. section 360bbb-3(b)(1), unless the authorization is terminated or revoked sooner.    Influenza A by PCR NEGATIVE NEGATIVE Final   Influenza B by PCR NEGATIVE NEGATIVE Final    Comment: (NOTE) The Xpert Xpress SARS-CoV-2/FLU/RSV assay is intended as an aid in  the diagnosis of influenza from Nasopharyngeal swab specimens and  should not be used as a sole basis for treatment. Nasal washings and  aspirates are unacceptable for Xpert Xpress SARS-CoV-2/FLU/RSV  testing. Fact Sheet for Patients: PinkCheek.be Fact Sheet for Healthcare Providers: GravelBags.it This test is not yet approved or cleared by the [place] FDA and  has been authorized for detection and/or diagnosis of SARS-CoV-2 by  FDA under an Emergency Use Authorization (EUA). This EUA will remain  in effect (meaning this test can be used) for the duration of the  Covid-19  declaration under Section 564(b)(1) of the Act, 21  U.S.C. section 360bbb-3(b)(1), unless the authorization is  terminated or revoked. Performed at [facility], [place] [address], [place], [place] [address]   Culture, blood (routine x 2)     Status: None (Preliminary result)   Collection Time: 02/11/20  9:26 PM   Specimen: BLOOD  Result Value Ref Range Status   Specimen Description BLOOD LEFT ANTECUBITAL  Final   Special Requests   Final    BOTTLES DRAWN AEROBIC AND ANAEROBIC Blood Culture adequate volume   Culture   Final    NO GROWTH 4 DAYS Performed at [facility], [place] [address], [place], [place] [address]    Report Status PENDING  Incomplete  Culture, blood (routine x 2)     Status: None (Preliminary result)   Collection Time: 02/11/20  9:53 PM   Specimen: BLOOD RIGHT HAND  Result Value Ref Range Status   Specimen Description BLOOD RIGHT HAND  Final   Special Requests   Final    BOTTLES DRAWN AEROBIC ONLY Blood Culture adequate volume   Culture   Final    NO GROWTH 4 DAYS Performed at [facility],  [address], [place], [place] [address]    Report Status PENDING  Incomplete    Radiology Reports DG Chest 2 View  Result Date: 02/11/2020 CLINICAL DATA:  Shortness of breath EXAM: CHEST - 2 VIEW COMPARISON:  January 23, 2020 FINDINGS: There is either scarring in the lateral right base or small right pleural effusion. There is apparent scarring in the right mid lung as well. Lungs elsewhere are clear. Heart size and pulmonary vascularity are normal. There is aortic atherosclerosis. No adenopathy. There is degenerative change in the thoracic spine. IMPRESSION: Scarring right mid lung. Question scarring lateral right base versus small right pleural effusion. Lungs elsewhere clear. Stable cardiac silhouette.  Aortic Atherosclerosis (ICD10-I70.0). Electronically Signed   By: Lowella Grip III M.D.   On: 02/11/2020 11:40   DG Chest 2 View  Result Date:  01/21/2020 CLINICAL DATA:  Pneumonia 2 months ago. Dry cough. EXAM: CHEST - 2 VIEW COMPARISON:  Chest x-ray dated 12/25/2019. FINDINGS: Improved aeration at the RIGHT lung base/costophrenic angle. No new opacity. Lungs are hyperexpanded. Chronic bronchitic changes noted centrally. Heart size and mediastinal contours are within normal limits. No acute or suspicious osseous finding. IMPRESSION: 1. No active cardiopulmonary disease. No evidence of pneumonia or pulmonary edema on today's exam. 2. Improved aeration at the RIGHT lung base compared to the previous chest x-ray of 12/25/2019. 3. Hyperexpanded lungs indicating COPD. Associated chronic bronchitic changes centrally. Electronically Signed   By: Franki Cabot M.D.   On: 01/21/2020 17:09   CT CHEST WO CONTRAST  Result Date: 02/12/2020 CLINICAL DATA:  Cancer of unknown primary. EXAM: CT CHEST WITHOUT CONTRAST TECHNIQUE: Multidetector CT imaging of the chest was performed following the standard protocol without IV contrast. COMPARISON:  January 23, 2020. FINDINGS: Cardiovascular: Atherosclerosis of thoracic aorta is noted without aneurysm formation. Normal cardiac size. No pericardial effusion. Coronary artery calcifications are noted. Mediastinum/Nodes: No enlarged mediastinal or axillary lymph nodes. Thyroid gland, trachea, and esophagus demonstrate no significant findings. Lungs/Pleura: No pneumothorax is noted. Minimal right pleural effusion is noted. Minimal bilateral posterior basilar subsegmental atelectasis is noted. Stable left apical scarring is noted. Stable right middle lobe scarring is noted. Stable right posterior upper lobe opacity is noted concerning for pneumonia or possibly scarring. Upper Abdomen: No acute abnormality. Musculoskeletal: No chest wall mass or suspicious bone lesions identified. IMPRESSION: 1. Coronary artery calcifications are noted suggesting coronary artery disease. 2. Minimal right pleural effusion is noted. 3. Stable right  posterior upper lobe opacity is noted concerning for pneumonia or possibly scarring. Stable right middle lobe scarring is noted. 4. Stable left apical scarring is noted. 5. Minimal bilateral posterior basilar subsegmental atelectasis is noted. Aortic Atherosclerosis (ICD10-I70.0). Electronically Signed   By: Marijo Conception M.D.   On: 02/12/2020 11:46   CT Chest W Contrast  Result Date: 01/23/2020 CLINICAL DATA:  85 year old male with generalized weakness. EXAM: CT CHEST WITH CONTRAST TECHNIQUE: Multidetector CT imaging of the chest was performed during intravenous contrast administration. CONTRAST:  79m OMNIPAQUE IOHEXOL 300 MG/ML  SOLN COMPARISON:  Chest radiograph dated 01/23/2020. Chest CT dated 12/29/2019 FINDINGS: Cardiovascular: There is no cardiomegaly or pericardial effusion. Coronary vascular calcification primarily involving the LAD and left circumflex artery. There is moderate atherosclerotic calcification of the thoracic aorta. No aneurysmal dilatation or dissection. Evaluation of the pulmonary arteries is limited due to suboptimal opacification. No definite pulmonary artery embolus identified. Mediastinum/Nodes: There is no hilar or mediastinal adenopathy. Right hilar lymph node measures 7 mm in short axis. The  esophagus is grossly unremarkable. No mediastinal fluid collection. Lungs/Pleura: There is a small right pleural effusion, decreased in size since the prior CT. Right upper lobe streaky and nodular densities most consistent with pneumonia. Overall improvement of the right upper lobe densities since the prior CT. Clinical correlation and continued follow-up recommended. Additional nodular densities in the left apex and left lower lobe. There is no pneumothorax. The central airways are patent. There is mild bronchial wall thickening most consistent with bronchitis. Biapical scarring noted. Upper Abdomen: Small gallstones. Musculoskeletal: Degenerative changes of the spine. No acute osseous  pathology. IMPRESSION: 1. Right upper lobe pneumonia, improved since the prior CT. Clinical correlation and continued follow-up recommended. 2. Small right pleural effusion, decreased in size since the prior CT. 3. Mild bronchial wall thickening most consistent with bronchitis. 4. Cholelithiasis. 5. Aortic Atherosclerosis (ICD10-I70.0). Electronically Signed   By: Anner Crete M.D.   On: 01/23/2020 17:23   MR BRAIN WO CONTRAST  Result Date: 02/11/2020 CLINICAL DATA:  Subacute neuro deficit EXAM: MRI HEAD WITHOUT CONTRAST TECHNIQUE: Multiplanar, multiecho pulse sequences of the brain and surrounding structures were obtained without intravenous contrast. COMPARISON:  02/17/2009 FINDINGS: Brain: No acute infarction, hemorrhage, hydrocephalus, extra-axial collection or mass lesion. Cerebral volume loss and ventriculomegaly that is progressed from 2010. No significant ischemic changes. Vascular: Normal flow voids Skull and upper cervical spine: Normal marrow signal Sinuses/Orbits: Marked atrophy of the right optic nerve compared to the left, see coronal reformats. There is history of blindness on the right per the chart. Bilateral mastoid opacification. Left sphenoid sinus opacification. IMPRESSION: 1. No acute or reversible finding. 2. Brain atrophy that has progressed from 2010. 3. Left sphenoid sinus and bilateral mastoid opacification with negative nasopharynx. Electronically Signed   By: Monte Fantasia M.D.   On: 02/11/2020 19:17   CT Abdomen Pelvis W Contrast  Result Date: 02/11/2020 CLINICAL DATA:  Nausea and vomiting. Weakness. Recent pneumonia EXAM: CT ABDOMEN AND PELVIS WITH CONTRAST TECHNIQUE: Multidetector CT imaging of the abdomen and pelvis was performed using the standard protocol following bolus administration of intravenous contrast. CONTRAST:  136m OMNIPAQUE IOHEXOL 300 MG/ML  SOLN COMPARISON:  None. FINDINGS: Lower Chest: No acute findings. Tiny pleural effusions noted bilaterally.  Hepatobiliary: No hepatic masses identified. A few tiny sub-cm calcified gallstones are seen, however there is no evidence of cholecystitis or biliary dilatation. Pancreas:  No mass or inflammatory changes. Spleen: Within normal limits in size and appearance. Adrenals/Urinary Tract: No masses identified. No evidence of ureteral calculi or hydronephrosis. Stomach/Bowel: No evidence of obstruction, inflammatory process or abnormal fluid collections. Normal appendix visualized. Diverticulosis is seen mainly involving the sigmoid colon, however there is no evidence of diverticulitis. Vascular/Lymphatic: No pathologically enlarged lymph nodes. No abdominal aortic aneurysm. Aortic atherosclerosis noted. Reproductive:  No mass or other significant abnormality. Other:  None. Musculoskeletal:  No suspicious bone lesions identified. IMPRESSION: 1. Colonic diverticulosis, without radiographic evidence of diverticulitis or other acute findings. 2. Cholelithiasis. No radiographic evidence of cholecystitis. 3. Tiny bilateral pleural effusions. Aortic Atherosclerosis (ICD10-I70.0). Electronically Signed   By: JMarlaine HindM.D.   On: 02/11/2020 14:08   DG Chest Portable 1 View  Result Date: 01/23/2020 CLINICAL DATA:  Progressive weakness. Recent history of pneumonia. EXAM: PORTABLE CHEST 1 VIEW COMPARISON:  Chest x-ray dated 01/19/2020 and chest CT dated 12/29/2019 FINDINGS: The heart size and pulmonary vascularity are normal. Aortic atherosclerosis. Chronic blunting of the right costophrenic angle laterally. Chronic slight parenchymal scarring in the right midzone. Left lung is clear.  No acute bone abnormality. Moderate arthritic changes of both glenohumeral joints. IMPRESSION: 1. No acute abnormalities. 2.  Aortic Atherosclerosis (ICD10-I70.0). Electronically Signed   By: Lorriane Shire M.D.   On: 01/23/2020 13:45    Time Spent in minutes  30   Lala Lund M.D on 02/16/2020 at 11:12 AM  To page go to [email or web address] -  password [facility]

## 2020-02-16 NOTE — [place] FL2 (Signed)
[place] MEDICAID FL2 LEVEL OF CARE SCREENING TOOL     IDENTIFICATION  Patient Name: Jeffrey Ho Birthdate: January 09, 1935 Sex: male Admission Date (Current Location): 02/11/2020  [facility] and [place] Number:  [employer] and Address:  [facility] [place] [place] [address] [place], [place] [address]      Provider Number: [id number]  Attending Physician Name and Address:  Thurnell Lose, MD  Relative Name and Phone Number:  Laban Orourke - wife; [phone]    Current Level of Care: Hospital Recommended Level of Care: [place] Prior Approval Number:    Date Approved/Denied:   PASRR Number: [id number]  Discharge Plan: SNF    Current Diagnoses: Patient Active Problem List   Diagnosis Date Noted  . Failure to thrive in adult 02/11/2020  . Tachypnea 02/11/2020  . Physical deconditioning 02/11/2020  . Weakness 01/24/2020  . Symptomatic anemia 01/23/2020  . Prolonged QT interval 01/23/2020  . Hyponatremia 01/23/2020  . Hypoalbuminemia due to protein-calorie malnutrition ([place]) 01/23/2020  . Leukocytosis 01/23/2020  . HTN (hypertension) 12/31/2019  . Carotid artery disease without cerebral infarction ([place]) 12/31/2019  . Cardiac ischemia 12/31/2019  . CAP (community acquired pneumonia) 12/31/2019  . Hyperlipidemia 12/31/2019  . Heme positive stool 12/31/2019  . Occlusion and stenosis of carotid artery without mention of cerebral infarction 08/22/2012    Orientation RESPIRATION BLADDER Height & Weight     Self, Time, Situation, Place  Normal Incontinent, External catheter(catheter placed 02/12/20) Weight: 216 lb 14.9 oz (98.4 kg) Height:  6' (182.9 cm)  BEHAVIORAL SYMPTOMS/MOOD NEUROLOGICAL BOWEL NUTRITION STATUS      Continent Diet(Regular)  AMBULATORY STATUS COMMUNICATION OF NEEDS Skin   Limited Assist(Min guard per PT on 02/15/20) Verbally Normal                       Personal Care Assistance Level of Assistance   Bathing, Feeding, Dressing Bathing Assistance: Maximum assistance(Upper body min assist; Lower body max assist) Feeding assistance: Limited assistance(Assistance with set-up) Dressing Assistance: Maximum assistance(Upper body min assist; Lower body total assist)     Functional Limitations Info  Sight, Hearing, Speech Sight Info: Impaired(Wears glasses) Hearing Info: Impaired(Wears hearing aids) Speech Info: Adequate    SPECIAL CARE FACTORS FREQUENCY  PT (By licensed PT), OT (By licensed OT), Speech therapy     PT Frequency: Evalluated 5/10. PT at SNF Eval and Treat a minimum of 5 days per week OT Frequency: Evaluated 5/10. OT at [facility] Eval and Treat a minimum of 5 days per week     Speech Therapy Frequency: Speech eval 02/14/20. DYS 2 diet recommended (fine chop-thin liquid)      Contractures Contractures Info: Not present    Additional Factors Info  Code Status, Allergies Code Status Info: DNR Allergies Info: No known allergies           Current Medications (02/16/2020):  This is the current hospital active medication list Current Facility-Administered Medications  Medication Dose Route Frequency Provider Last Rate Last Admin  . 0.9 %  sodium chloride infusion (Manually program via Guardrails IV Fluids)   Intravenous Once Maryanna Shape, NP      . acetaminophen (TYLENOL) tablet 650 mg  650 mg Oral Q6H PRN Karmen Bongo, MD      . atorvastatin (LIPITOR) tablet 20 mg  20 mg Oral Daily Karmen Bongo, MD   20 mg at 02/16/20 0857  . azithromycin (ZITHROMAX) tablet 250 mg  250 mg Oral  Daily Leroy Sea, MD   250 mg at 02/16/20 0856  . bisacodyl (DULCOLAX) EC tablet 5 mg  5 mg Oral Daily PRN Jonah Blue, MD      . docusate sodium (COLACE) capsule 100 mg  100 mg Oral BID Jonah Blue, MD   100 mg at 02/16/20 0857  . enoxaparin (LOVENOX) injection 40 mg  40 mg Subcutaneous Q24H Jonah Blue, MD   40 mg at 02/15/20 1658  . feeding supplement (ENSURE ENLIVE)  (ENSURE ENLIVE) liquid 237 mL  237 mL Oral TID BM Leroy Sea, MD   237 mL at 02/16/20 0858  . feeding supplement (PRO-STAT SUGAR FREE 64) liquid 30 mL  30 mL Oral TID WC Leroy Sea, MD   30 mL at 02/16/20 0858  . ferrous sulfate tablet 325 mg  325 mg Oral BID WC Jonah Blue, MD   325 mg at 02/16/20 0857  . folic acid (FOLVITE) tablet 1 mg  1 mg Oral Daily Jonah Blue, MD   1 mg at 02/16/20 0858  . furosemide (LASIX) tablet 40 mg  40 mg Oral BID Peggyann Shoals C, DO   40 mg at 02/16/20 0857  . hydrALAZINE (APRESOLINE) injection 5 mg  5 mg Intravenous Q4H PRN Jonah Blue, MD      . metroNIDAZOLE (FLAGYL) tablet 500 mg  500 mg Oral Q8H Leroy Sea, MD   500 mg at 02/16/20 0523  . ondansetron (ZOFRAN) injection 4 mg  4 mg Intravenous Q6H PRN Jonah Blue, MD   4 mg at 02/15/20 0923  . pantoprazole (PROTONIX) EC tablet 40 mg  40 mg Oral Daily Leroy Sea, MD   40 mg at 02/16/20 0858  . phenol-menthol (CEPASTAT) lozenge 1 lozenge  1 lozenge Buccal PRN Leroy Sea, MD   1 lozenge at 02/14/20 2158  . polyethylene glycol (MIRALAX / GLYCOLAX) packet 17 g  17 g Oral Daily PRN Jonah Blue, MD      . predniSONE (DELTASONE) tablet 50 mg  50 mg Oral Q breakfast Leroy Sea, MD   50 mg at 02/16/20 0857  . sodium chloride (OCEAN) 0.65 % nasal spray 1 spray  1 spray Each Nare PRN Leroy Sea, MD   1 spray at 02/15/20 2125  . zolpidem (AMBIEN) tablet 2.5 mg  2.5 mg Oral QHS PRN Leroy Sea, MD   2.5 mg at 02/15/20 2123     Discharge Medications: Please see discharge summary for a list of discharge medications.  Relevant Imaging Results:  Relevant Lab Results:   Additional Information ss#[id number]  Cristobal Goldmann, LCSW

## 2020-02-16 NOTE — Progress Notes (Signed)
COURTESY NOTE: Discharge plans noted. -- patient has MPO antibodies, ANCA titer pending. Would discharge on current dose of prednisone but suggest patient be evaluated by rheumatology 5/17 at his new facility to decide whether steroids should be continued or other treatment started.

## 2020-02-16 NOTE — TOC Progression Note (Addendum)
Transition of Care (TOC) - Progression Note  *Patient will discharge to [facility] when medically stable. *Insurance auth received - see below. *COVID test will be needed prior to discharge   Patient Details  Name: Jeffrey Ho MRN: [id number] Date of Birth: 1935/02/26  Transition of Care [facility]) CM/SW Contact  Okey Dupre Lazaro Arms, LCSW Phone Number: 02/16/2020, 11:55 AM  Clinical Narrative: Checked most recent MD progress note and d/c likely 5/16 if work-up complete. Call made to Jeffrey Ho, admissions SW at [facility] regarding patient and they can take. CSW the received call from [place], admissions SW at [facility] regarding patient's discharge. CSW informed by Jeffrey Ho that she will contact wife. Jeffrey Ho advised that insurance Berkley Harvey will be initiated today with [employer].  *[employer] contacted and CSW given pending auth #[id number]. Clinicals then faxed to [employer].   3:52 pm: Contacted Jeffrey Ho at [facility] and approval information provided. 3:57 pm: Secure chat with MD regarding insurance authorization eff. 5/15 3:49 pm - Received call from [employer] representative and patient approved for ST rehab at [facility] eff. 5/15. Reference number [id number] for 4 days. [facility] contact for facility is Jeffrey Ho and the next review date is 4/18. Fax number is [phone]. 3:52 pm: Jeffrey Ho at [facility] and approval information provided. 3:57 pm: Secure chat with MD regarding insurance auth effective 5/15 4 pm: Visited patient's room and updated Jeffrey Ho and wife Jeffrey Ho.  CSW will continue to follow and facilitate discharge to [facility] when medically stable.    Expected Discharge Plan: Skilled Nursing Facility Barriers to Discharge: Continued Medical Work up  Expected Discharge Plan and Services Expected Discharge Plan: Skilled Nursing Facility       Living arrangements for the past 2 months: Single Family  Home                                     Social Determinants of Health (SDOH) Interventions  No SDOH intervention requested or needed at this time.   Readmission Risk Interventions No flowsheet data found.

## 2020-02-16 NOTE — Progress Notes (Signed)
Subjective:   UOP 2290-  Ended up just 600 neg.  Sitting up in bedside chair- says feels good-  Ate all of his breakfast !! (give first dose of prednisone 50 mg yest)    Objective Vital signs in last 24 hours: Vitals:   02/15/20 0524 02/15/20 1607 02/15/20 2222 02/16/20 0532  BP: 130/69 137/70 111/62 129/71  Pulse: (!) 104 85 81 75  Resp: 20 18 12 20   Temp: 99.8 F (37.7 C) 98.2 F (36.8 C) (!) 97.5 F (36.4 C)   TempSrc: Oral Oral Oral   SpO2: 98% 98% 96% 98%  Weight:   98.4 kg   Height:       Weight change:   Intake/Output Summary (Last 24 hours) at 02/16/2020 0915 Last data filed at 02/16/2020 0827 Gross per 24 hour  Intake 1449 ml  Output 2290 ml  Net -841 ml    Assessment/Plan: 1. Hyponatremia:  Is hypervolemic hyponatremia- improved a little this AM 2. AKI: Baseline creatinine 0.99, elevated to the 1.4's.  Initially felt after contrasted study ; inflammatory markers are elevated.    Labs have been ordered to evaluate for vasculitis-  ANCA, rheumatoid factor, ANA, sed rate, ANCA titers, myeloperoxidase pending, C3, and C4 are WNL             - protein/creatinine ratio 1.5- not nephrotic, UPEP, SPEP, Kappa/Lambda light chains are pending  3. Hypoalbuminemia: Patient's albumin this admission 1.1, however low albumin precedes protein in UA and his proteinuria is not nephrotic  4. Lower extremity swelling: New onset in February 2021, patient's recent echo in April 2021 negative for systolic dysfunction, BNP 149. Edema stable at 1-2+ bilaterally. Have started lasix PO 40 BID 5. Hypertension: controlled on low dose hydralazine 6. Anemia: Low and falling. Invasive eval for source of GIB negative.  His iron sat is low but ferritin 1800.  Heme- onc involved 7. Recent community-acquired pneumonia  leukocytosis: Patient with persistently elevated leukocytosis at 21K+. Patient started on Steroids prednisone 50mg  daily today 02/15/2020. Elevated procalcitonin 0.7, patient continues to  take antibiotics metronidazole and azithromycin. 8. Overall FTT- had been drastic over last 3 months.   Unifying diagnosis still missing, many labs pending-  Currently on a trial of high dose steroids to see if clinical improvement - so far so good  Over the weekend will have kidney team look at labs and UOP- may not see.  If there is something for [place] to do we will do.  Call with questions over the weekend-  Will revisit on Monday      [place]: Basic Metabolic Panel: Recent Labs  Lab 02/14/20 0617 02/15/20 0540 02/16/20 0548  NA 127* 128* 129*  K 4.0 3.6 3.8  CL 93* 93* 94*  CO2 24 25 27   GLUCOSE 125* 115* 145*  BUN 26* 35* 51*  CREATININE 1.47* 1.42* 1.49*  CALCIUM 7.5* 7.4* 7.7*   Liver Function Tests: Recent Labs  Lab 02/14/20 0617 02/15/20 0540 02/16/20 0548  AST 54* 38 26  ALT 38 34 27  ALKPHOS 174* 165* 122  BILITOT 0.7 0.8 0.5  PROT 6.3* 6.2* 5.8*  ALBUMIN 1.2* 1.1* 1.2*   No results for input(s): LIPASE, AMYLASE in the last 168 hours. No results for input(s): AMMONIA in the last 168 hours. CBC: Recent Labs  Lab 02/12/20 0357 02/12/20 0357 02/13/20 0317 02/13/20 2227 02/14/20 0617 02/15/20 0540 02/16/20 0548  WBC 23.4*   < > 21.2*  --  23.8* 24.3* 21.2*  NEUTROABS  --   --  16.6*  --  18.4* 18.5* 16.9*  HGB 8.4*   < > 7.7*   < > 9.5* 8.8* 7.9*  HCT 27.4*   < > 24.8*   < > 30.2* 27.9* 25.9*  MCV 87.5  --  85.5  --  85.8 85.3 86.0  PLT 493*   < > 422*  --  428* 458* 399   < > = values in this interval not displayed.   Cardiac Enzymes: Recent Labs  Lab 02/15/20 1054  CKTOTAL 11*   CBG: No results for input(s): GLUCAP in the last 168 hours.  Iron Studies: No results for input(s): IRON, TIBC, TRANSFERRIN, FERRITIN in the last 72 hours. Studies/Results: No results found. Medications: Infusions:   Scheduled Medications: . sodium chloride   Intravenous Once  . atorvastatin  20 mg Oral Daily  . azithromycin  250 mg  Oral Daily  . docusate sodium  100 mg Oral BID  . enoxaparin (LOVENOX) injection  40 mg Subcutaneous Q24H  . feeding supplement (ENSURE ENLIVE)  237 mL Oral TID BM  . feeding supplement (PRO-STAT SUGAR FREE 64)  30 mL Oral TID WC  . ferrous sulfate  325 mg Oral BID WC  . folic acid  1 mg Oral Daily  . furosemide  40 mg Oral BID  . metroNIDAZOLE  500 mg Oral Q8H  . pantoprazole  40 mg Oral Daily  . predniSONE  50 mg Oral Q breakfast    have reviewed scheduled and prn medications.  Physical Exam: General: sitting up in bedside chair  Heart: RRR Lungs: mostly clear Abdomen: soft, non tender Extremities: doughy edema throughout LE's     02/16/2020,9:15 AM  LOS: 5 days

## 2020-02-17 LAB — CBC WITH DIFFERENTIAL/PLATELET
Abs Immature Granulocytes: 0.57 10*3/uL — ABNORMAL HIGH (ref 0.00–0.07)
Basophils Absolute: 0 10*3/uL (ref 0.0–0.1)
Basophils Relative: 0 %
Eosinophils Absolute: 0 10*3/uL (ref 0.0–0.5)
Eosinophils Relative: 0 %
HCT: 26.4 % — ABNORMAL LOW (ref 39.0–52.0)
Hemoglobin: 8.2 g/dL — ABNORMAL LOW (ref 13.0–17.0)
Immature Granulocytes: 3 %
Lymphocytes Relative: 13 %
Lymphs Abs: 2.6 10*3/uL (ref 0.7–4.0)
MCH: 26.8 pg (ref 26.0–34.0)
MCHC: 31.1 g/dL (ref 30.0–36.0)
MCV: 86.3 fL (ref 80.0–100.0)
Monocytes Absolute: 1.4 10*3/uL — ABNORMAL HIGH (ref 0.1–1.0)
Monocytes Relative: 7 %
Neutro Abs: 15.7 10*3/uL — ABNORMAL HIGH (ref 1.7–7.7)
Neutrophils Relative %: 77 %
Platelets: 474 10*3/uL — ABNORMAL HIGH (ref 150–400)
RBC: 3.06 MIL/uL — ABNORMAL LOW (ref 4.22–5.81)
RDW: 15.1 % (ref 11.5–15.5)
WBC: 20.3 10*3/uL — ABNORMAL HIGH (ref 4.0–10.5)
nRBC: 0 % (ref 0.0–0.2)

## 2020-02-17 LAB — BASIC METABOLIC PANEL
Anion gap: 10 (ref 5–15)
BUN: 61 mg/dL — ABNORMAL HIGH (ref 8–23)
CO2: 26 mmol/L (ref 22–32)
Calcium: 7.7 mg/dL — ABNORMAL LOW (ref 8.9–10.3)
Chloride: 96 mmol/L — ABNORMAL LOW (ref 98–111)
Creatinine, Ser: 1.4 mg/dL — ABNORMAL HIGH (ref 0.61–1.24)
GFR calc Af Amer: 54 mL/min — ABNORMAL LOW (ref >60)
GFR calc non Af Amer: 46 mL/min — ABNORMAL LOW (ref >60)
Glucose, Bld: 174 mg/dL — ABNORMAL HIGH (ref 70–99)
Potassium: 3.4 mmol/L — ABNORMAL LOW (ref 3.5–5.1)
Sodium: 132 mmol/L — ABNORMAL LOW (ref 135–145)

## 2020-02-17 LAB — MAGNESIUM: Magnesium: 2 mg/dL (ref 1.7–2.4)

## 2020-02-17 LAB — C-REACTIVE PROTEIN: CRP: 7.3 mg/dL — ABNORMAL HIGH (ref <1.0)

## 2020-02-17 LAB — BRAIN NATRIURETIC PEPTIDE: B Natriuretic Peptide: 211.3 pg/mL — ABNORMAL HIGH (ref 0.0–100.0)

## 2020-02-17 LAB — PROCALCITONIN: Procalcitonin: 0.33 ng/mL

## 2020-02-17 LAB — RHEUMATOID FACTOR: Rheumatoid fact SerPl-aCnc: 27 IU/mL — ABNORMAL HIGH (ref 0.0–13.9)

## 2020-02-17 MED ORDER — POTASSIUM CHLORIDE CRYS ER 20 MEQ PO TBCR
40.0000 meq | EXTENDED_RELEASE_TABLET | Freq: Every day | ORAL | Status: DC
  Administered 2020-02-18 – 2020-02-19 (×2): 40 meq via ORAL
  Filled 2020-02-17 (×2): qty 2

## 2020-02-17 MED ORDER — POTASSIUM CHLORIDE CRYS ER 20 MEQ PO TBCR
40.0000 meq | EXTENDED_RELEASE_TABLET | Freq: Once | ORAL | Status: AC
  Administered 2020-02-17: 40 meq via ORAL
  Filled 2020-02-17: qty 2

## 2020-02-17 MED ORDER — PREDNISONE 20 MG PO TABS
20.0000 mg | ORAL_TABLET | Freq: Every day | ORAL | Status: DC
  Administered 2020-02-17 – 2020-02-18 (×2): 20 mg via ORAL
  Filled 2020-02-17 (×2): qty 1

## 2020-02-17 MED ORDER — MENTHOL 3 MG MT LOZG
1.0000 | LOZENGE | OROMUCOSAL | Status: DC | PRN
  Filled 2020-02-17: qty 9

## 2020-02-17 NOTE — Plan of Care (Signed)
  Problem: Education: Goal: Knowledge of General Education information will improve Description Including pain rating scale, medication(s)/side effects and non-pharmacologic comfort measures Outcome: Progressing   

## 2020-02-17 NOTE — Progress Notes (Signed)
PROGRESS NOTE                                                                                                                                                                                                             Patient Demographics:    Jeffrey Ho, is a 85 y.o. male, DOB - 1934-10-29, XEN:[id number]  Admit date - 02/11/2020   Admitting Physician Karmen Bongo, MD  Outpatient Primary MD for the patient is Redmond School, MD  LOS - 6  Chief Complaint  Patient presents with  . Weakness       Brief Narrative - Jeffrey Ho is a 85 y.o. male with medical history significant of R eye blindness; carotid artery occlusion; HTN; and HLD.  He was admitted from 3/28-30 for CAP with demand ischemia; he was discharged on Augmentin.  During that admission, he had heme positive stools but had stable Hgb and no overt bleeding and so was planned for outpatient GI f/u.  He returned from 4/20-23 with symptomatic anemia and hyponatremia.  EGD/colonoscopy performed with reflux erosive esophagitis and diverticulosis/polyps, he was subsequently discharged home presented back to the hospital again on 02/11/2020 with generalized weakness, worsening anemia, hyponatremia and excessive fatigue and was admitted to the hospital for further work-up.   Subjective:   Patient in bed, appears comfortable, denies any headache, no fever, no chest pain or pressure, no shortness of breath , no abdominal pain. No focal weakness.    Assessment  & Plan :     1.  Failure to thrive, excessive, persistent fatigue and deconditioning with symptomatic recurrent anemia of chronic disease, high inflammatory markers including CRP, ESR and leukocytosis. he now appears to be hemodynamically stable, seen by hematology oncology, CT chest abdomen pelvis nonacute with no suspicion for malignancy.  His decline seems to be age-related although there could be a small possibility of a lingering infection for which we will  challenge him with 7 days of azithromycin and Flagyl since he has history of pneumonia and diverticulosis.  He has no abdominal pain or productive cough.  Of note he had recent EGD and colonoscopy in [place] by Dr. Gala Romney which was nonacute as well.  He received 1 unit of packed RBC by hematology on 02/13/2020 after which his H&H appears to be stable.  No signs of ongoing bleeding.  After detailed discussions with wife it appears that his main symptoms are of excessive fatigue, question if he has underlying PMR, -ve ANA, stable CK 3 C4  levels, high myeloperoxidase antibody, high rheumatoid factor, pending ANCA, SPEP and UPEP levels are not normal however UPEP shows high Kappa and lambda light chains, SPEP shows high alpha-1 globulin and gammaglobulin, low albumin will defer to nephrology to interpret, he has no headache or visual problems.  He was started on a therapeutic trial of oral prednisone on 02/15/2020 and remarkably feels a whole lot better the next day, continue steroids and await results for antibody work-up for vasculitis, trend CRP.  Definitely require outpatient nephrology and vascular surgery follow-up.  For now he is much improved on steroids dose tapered to 20 mg, still running diagnosis is PMR.   2.  Deconditioning.  PT OT, may require placement.  MRI brain nonacute but does show atrophy.  3.  Hyponatremia with fluid overload and edema.  Likely due to third spacing of fluids due to severe protein calorie malnutrition, recent echo noted with preserved EF and mild diastolic dysfunction, no pulmonary edema hence I do not think this is CHF. Low-dose oral Lasix to continue if renal function can tolerate.  Protein supplementations being provided.  4.  Severe PCM causing third spacing of fluid and edema.  Has been placed on protein supplements, LFTs appear stable with stable bilirubin, UA has mild proteinuria.  Outpatient nephrology follow-up would suffice.  5.  AKI.  This is likely due to  contrast nephropathy he received IV contrast for his CT abdomen and pelvis, proved after gentle hydration on 02/13/2020, slightly worse again will hydrate gently on 02/14/2020.  Renal following appreciate their input.  He has increased light chains    6.  Dyslipidemia continue home dose statin.  7.  HX of pneumonia with lung scarring.  CT noncontrast stable see #1 above.  8.  Diverticulosis.  Stable.  Nonacute CT abdomen pelvis.  See #1 above and monitor.  No pain or diarrhea.    Condition - Fair  Family Communication  :   Message left for wife Jeffrey Ho on [phone] on 02/12/2020 at 10:45 AM.  Message left 02/17/2020 at 10:10 AM  Wife updated bedside 02/13/20, 02/14/20, 02/15/20, 02/16/20   Code Status :  DNR  Consults  :  Onc, Renal  Disposition Plan  :    Status is: Inpatient  Remains inpatient appropriate because:Ongoing diagnostic testing needed not appropriate for outpatient work up   Dispo: The patient is from: Home              Anticipated d/c is to: SNF              Anticipated d/c date is: Likely 02/18/2020 if work-up is stable               Patient currently is not medically stable to d/c.  Had recurrent admissions for failure to thrive and fatigue, hematological work-up is being done to rule out underlying malignancy.   Procedures  :    CT Chest - Non acute  CT abdomen and pelvis with contrast -  1. Colonic diverticulosis, without radiographic evidence of diverticulitis or other acute findings. 2. Cholelithiasis. No radiographic evidence of cholecystitis. 3. Tiny bilateral pleural effusions. Aortic Atherosclerosis  MRI brain - Non acute   PUD Prophylaxis : PPI  DVT Prophylaxis  :  Lovenox   Lab Results  Component Value Date   PLT 474 (H) 02/17/2020    Diet :  Diet Order            Diet regular Room service appropriate? Yes; Fluid consistency: Thin  Diet effective now               Inpatient Medications Scheduled Meds: . sodium chloride    Intravenous Once  . atorvastatin  20 mg Oral Daily  . azithromycin  250 mg Oral Daily  . docusate sodium  100 mg Oral BID  . enoxaparin (LOVENOX) injection  40 mg Subcutaneous Q24H  . feeding supplement (ENSURE ENLIVE)  237 mL Oral TID BM  . feeding supplement (PRO-STAT SUGAR FREE 64)  30 mL Oral TID WC  . ferrous sulfate  325 mg Oral BID WC  . folic acid  1 mg Oral Daily  . furosemide  40 mg Oral BID  . metroNIDAZOLE  500 mg Oral Q8H  . pantoprazole  40 mg Oral Daily  . [START ON 02/18/2020] potassium chloride  40 mEq Oral Daily  . predniSONE  20 mg Oral Q breakfast   Continuous Infusions:  PRN Meds:.acetaminophen **OR** [DISCONTINUED] acetaminophen, bisacodyl, hydrALAZINE, [DISCONTINUED] ondansetron **OR** ondansetron (ZOFRAN) IV, phenol-menthol, polyethylene glycol, sodium chloride, zolpidem  Antibiotics  :   Anti-infectives (From admission, onward)   Start     Dose/Rate Route Frequency Ordered Stop   02/16/20 0600  metroNIDAZOLE (FLAGYL) tablet 500 mg     500 mg Oral Every 8 hours 02/15/20 1717 02/20/20 1359   02/13/20 1000  azithromycin (ZITHROMAX) tablet 250 mg     250 mg Oral Daily 02/13/20 0811 02/20/20 0959   02/13/20 0900  metroNIDAZOLE (FLAGYL) IVPB 500 mg  Status:  Discontinued     500 mg 100 mL/hr over 60 Minutes Intravenous Every 8 hours 02/13/20 0811 02/15/20 1718          Objective:   Vitals:   02/16/20 0950 02/16/20 2029 02/17/20 0406 02/17/20 0956  BP: 117/63 (!) 148/69 130/68 130/72  Pulse: 83 85 83 85  Resp: 18 18 15 18   Temp: (!) 97.5 F (36.4 C) 97.6 F (36.4 C) 98.6 F (37 C) 98.4 F (36.9 C)  TempSrc: Oral Oral Oral Oral  SpO2: 100% 99% 98% 100%  Weight:  98 kg    Height:        SpO2: 100 %  Wt Readings from Last 3 Encounters:  02/16/20 98 kg  01/23/20 97.5 kg  01/01/20 102 kg     Intake/Output Summary (Last 24 hours) at 02/17/2020 1003 Last data filed at 02/17/2020 0600 Gross per 24 hour  Intake 467 ml  Output 300 ml  Net 167 ml       Physical Exam  Awake Alert, No new F.N deficits, bilateral hearing loss, 1+ edema with TED stockings in place, [place].AT,PERRAL Supple Neck,No JVD, No cervical lymphadenopathy appriciated.  Symmetrical Chest wall movement, Good air movement bilaterally, CTAB RRR,No Gallops, Rubs or new Murmurs, No Parasternal Heave +ve B.Sounds, Abd Soft, No tenderness, No organomegaly appriciated, No rebound - guarding or rigidity. No Cyanosis, Clubbing or edema, No new Rash or bruise    Data Review:    Recent Labs  Lab 02/13/20 0317 02/13/20 0317 02/13/20 2227 02/14/20 0617 02/15/20 0540 02/16/20 0548 02/17/20 0353  WBC 21.2*  --   --  23.8* 24.3* 21.2* 20.3*  HGB 7.7*   < > 8.1* 9.5* 8.8* 7.9* 8.2*  HCT 24.8*   < > 25.2* 30.2* 27.9* 25.9* 26.4*  PLT 422*  --   --  428* 458* 399 474*  MCV 85.5  --   --  85.8 85.3 86.0 86.3  MCH 26.6  --   --  27.0  26.9 26.2 26.8  MCHC 31.0  --   --  31.5 31.5 30.5 31.1  RDW 15.3  --   --  15.2 15.3 15.0 15.1  LYMPHSABS 2.5  --   --  2.8 3.1 2.8 2.6  MONOABS 1.5*  --   --  1.4* 1.5* 1.2* 1.4*  EOSABS 0.3  --   --  0.8* 0.8* 0.0 0.0  BASOSABS 0.1  --   --  0.1 0.1 0.0 0.0   < > = values in this interval not displayed.    Recent Labs  Lab 02/11/20 1222 02/11/20 1222 02/12/20 0357 02/12/20 0739 02/13/20 0317 02/14/20 0617 02/15/20 0540 02/16/20 0548 02/17/20 0353  NA 129*   < >   < >  --  128* 127* 128* 129* 132*  K 4.2   < >   < >  --  3.6 4.0 3.6 3.8 3.4*  CL 95*   < >   < >  --  94* 93* 93* 94* 96*  CO2 26   < >   < >  --  23 24 25 27 26   GLUCOSE 108*   < >   < >  --  114* 125* 115* 145* 174*  BUN 20   < >   < >  --  23 26* 35* 51* 61*  CREATININE 1.23   < >   < >  --  1.28* 1.47* 1.42* 1.49* 1.40*  CALCIUM 7.9*   < >   < >  --  7.5* 7.5* 7.4* 7.7* 7.7*  AST 25  --   --   --  37 54* 38 26  --   ALT 24  --   --   --  25 38 34 27  --   ALKPHOS 190*  --   --   --  184* 174* 165* 122  --   BILITOT 0.6  --   --   --  0.8 0.7 0.8 0.5  --    ALBUMIN 1.4*  --   --   --  1.2* 1.2* 1.1* 1.2*  --   MG  --   --   --   --  1.7 1.8 1.8 1.9 2.0  CRP  --   --   --   --  19.9* 18.5* 16.9* 14.8* 7.3*  PROCALCITON  --    < >  --  0.53 0.58 0.54 0.73 0.53 0.33  TSH  --   --   --  2.638  --   --   --   --   --   BNP 191.8*   < >  --   --  159.4* 198.6* 149.3* 182.6* 211.3*   < > = values in this interval not displayed.    Recent Labs  Lab 02/11/20 1601 02/12/20 0739 02/13/20 0317 02/14/20 0617 02/15/20 0540 02/16/20 0548 02/17/20 0353  CRP  --   --  19.9* 18.5* 16.9* 14.8* 7.3*  BNP  --   --  159.4* 198.6* 149.3* 182.6* 211.3*  PROCALCITON  --    < > 0.58 0.54 0.73 0.53 0.33  SARSCOV2NAA NEGATIVE  --   --   --   --   --   --    < > = values in this interval not displayed.    ------------------------------------------------------------------------------------------------------------------ No results for input(s): CHOL, HDL, LDLCALC, TRIG, CHOLHDL, LDLDIRECT in the last 72 hours.  Lab Results  Component Value Date   HGBA1C 6.2 (  H) 01/01/2020   ------------------------------------------------------------------------------------------------------------------ No results for input(s): TSH, T4TOTAL, T3FREE, THYROIDAB in the last 72 hours.  Invalid input(s): FREET3 ------------------------------------------------------------------------------------------------------------------ No results for input(s): VITAMINB12, FOLATE, FERRITIN, TIBC, IRON, RETICCTPCT in the last 72 hours.  Coagulation profile No results for input(s): INR, PROTIME in the last 168 hours.  No results for input(s): DDIMER in the last 72 hours.  Cardiac Enzymes No results for input(s): CKMB, TROPONINI, MYOGLOBIN in the last 168 hours.  Invalid input(s): CK ------------------------------------------------------------------------------------------------------------------    Component Value Date/Time   BNP 211.3 (H) 02/17/2020 3748    Micro Results Recent  Results (from the past 240 hour(s))  Respiratory Panel by RT PCR (Flu A&B, Covid) - Nasopharyngeal Swab     Status: None   Collection Time: 02/11/20  4:01 PM   Specimen: Nasopharyngeal Swab  Result Value Ref Range Status   SARS Coronavirus 2 by RT PCR NEGATIVE NEGATIVE Final    Comment: (NOTE) SARS-CoV-2 target nucleic acids are NOT DETECTED. The SARS-CoV-2 RNA is generally detectable in upper respiratoy specimens during the acute phase of infection. The lowest concentration of SARS-CoV-2 viral copies this assay can detect is 131 copies/mL. A negative result does not preclude SARS-Cov-2 infection and should not be used as the sole basis for treatment or other patient management decisions. A negative result may occur with  improper specimen collection/handling, submission of specimen other than nasopharyngeal swab, presence of viral mutation(s) within the areas targeted by this assay, and inadequate number of viral copies (<131 copies/mL). A negative result must be combined with clinical observations, patient history, and epidemiological information. The expected result is Negative. Fact Sheet for Patients:  PinkCheek.be Fact Sheet for Healthcare Providers:  GravelBags.it This test is not yet ap proved or cleared by the [place] FDA and  has been authorized for detection and/or diagnosis of SARS-CoV-2 by FDA under an Emergency Use Authorization (EUA). This EUA will remain  in effect (meaning this test can be used) for the duration of the COVID-19 declaration under Section 564(b)(1) of the Act, 21 U.S.C. section 360bbb-3(b)(1), unless the authorization is terminated or revoked sooner.    Influenza A by PCR NEGATIVE NEGATIVE Final   Influenza B by PCR NEGATIVE NEGATIVE Final    Comment: (NOTE) The Xpert Xpress SARS-CoV-2/FLU/RSV assay is intended as an aid in  the diagnosis of influenza from Nasopharyngeal swab specimens  and  should not be used as a sole basis for treatment. Nasal washings and  aspirates are unacceptable for Xpert Xpress SARS-CoV-2/FLU/RSV  testing. Fact Sheet for Patients: PinkCheek.be Fact Sheet for Healthcare Providers: GravelBags.it This test is not yet approved or cleared by the [place] FDA and  has been authorized for detection and/or diagnosis of SARS-CoV-2 by  FDA under an Emergency Use Authorization (EUA). This EUA will remain  in effect (meaning this test can be used) for the duration of the  Covid-19 declaration under Section 564(b)(1) of the Act, 21  U.S.C. section 360bbb-3(b)(1), unless the authorization is  terminated or revoked. Performed at [facility], [place] [address], [place], [place] [address]   Culture, blood (routine x 2)     Status: None   Collection Time: 02/11/20  9:26 PM   Specimen: BLOOD  Result Value Ref Range Status   Specimen Description BLOOD LEFT ANTECUBITAL  Final   Special Requests   Final    BOTTLES DRAWN AEROBIC AND ANAEROBIC Blood Culture adequate volume   Culture   Final    NO GROWTH 5  DAYS Performed at [facility], [place] [address], [place], [place] [address]    Report Status 02/16/2020 FINAL  Final  Culture, blood (routine x 2)     Status: None   Collection Time: 02/11/20  9:53 PM   Specimen: BLOOD RIGHT HAND  Result Value Ref Range Status   Specimen Description BLOOD RIGHT HAND  Final   Special Requests   Final    BOTTLES DRAWN AEROBIC ONLY Blood Culture adequate volume   Culture   Final    NO GROWTH 5 DAYS Performed at [facility], [place] [address] [place], [place] [address]    Report Status 02/16/2020 FINAL  Final    Radiology Reports DG Chest 2 View  Result Date: 02/11/2020 CLINICAL DATA:  Shortness of breath EXAM: CHEST - 2 VIEW COMPARISON:  January 23, 2020 FINDINGS: There is either scarring in the lateral right base or small right pleural effusion.  There is apparent scarring in the right mid lung as well. Lungs elsewhere are clear. Heart size and pulmonary vascularity are normal. There is aortic atherosclerosis. No adenopathy. There is degenerative change in the thoracic spine. IMPRESSION: Scarring right mid lung. Question scarring lateral right base versus small right pleural effusion. Lungs elsewhere clear. Stable cardiac silhouette.  Aortic Atherosclerosis (ICD10-I70.0). Electronically Signed   By: Lowella Grip III M.D.   On: 02/11/2020 11:40   DG Chest 2 View  Result Date: 01/21/2020 CLINICAL DATA:  Pneumonia 2 months ago. Dry cough. EXAM: CHEST - 2 VIEW COMPARISON:  Chest x-ray dated 12/25/2019. FINDINGS: Improved aeration at the RIGHT lung base/costophrenic angle. No new opacity. Lungs are hyperexpanded. Chronic bronchitic changes noted centrally. Heart size and mediastinal contours are within normal limits. No acute or suspicious osseous finding. IMPRESSION: 1. No active cardiopulmonary disease. No evidence of pneumonia or pulmonary edema on today's exam. 2. Improved aeration at the RIGHT lung base compared to the previous chest x-ray of 12/25/2019. 3. Hyperexpanded lungs indicating COPD. Associated chronic bronchitic changes centrally. Electronically Signed   By: Franki Cabot M.D.   On: 01/21/2020 17:09   CT CHEST WO CONTRAST  Result Date: 02/12/2020 CLINICAL DATA:  Cancer of unknown primary. EXAM: CT CHEST WITHOUT CONTRAST TECHNIQUE: Multidetector CT imaging of the chest was performed following the standard protocol without IV contrast. COMPARISON:  January 23, 2020. FINDINGS: Cardiovascular: Atherosclerosis of thoracic aorta is noted without aneurysm formation. Normal cardiac size. No pericardial effusion. Coronary artery calcifications are noted. Mediastinum/Nodes: No enlarged mediastinal or axillary lymph nodes. Thyroid gland, trachea, and esophagus demonstrate no significant findings. Lungs/Pleura: No pneumothorax is noted. Minimal  right pleural effusion is noted. Minimal bilateral posterior basilar subsegmental atelectasis is noted. Stable left apical scarring is noted. Stable right middle lobe scarring is noted. Stable right posterior upper lobe opacity is noted concerning for pneumonia or possibly scarring. Upper Abdomen: No acute abnormality. Musculoskeletal: No chest wall mass or suspicious bone lesions identified. IMPRESSION: 1. Coronary artery calcifications are noted suggesting coronary artery disease. 2. Minimal right pleural effusion is noted. 3. Stable right posterior upper lobe opacity is noted concerning for pneumonia or possibly scarring. Stable right middle lobe scarring is noted. 4. Stable left apical scarring is noted. 5. Minimal bilateral posterior basilar subsegmental atelectasis is noted. Aortic Atherosclerosis (ICD10-I70.0). Electronically Signed   By: Marijo Conception M.D.   On: 02/12/2020 11:46   CT Chest W Contrast  Result Date: 01/23/2020 CLINICAL DATA:  85 year old male with generalized weakness. EXAM: CT CHEST WITH CONTRAST  TECHNIQUE: Multidetector CT imaging of the chest was performed during intravenous contrast administration. CONTRAST:  78m OMNIPAQUE IOHEXOL 300 MG/ML  SOLN COMPARISON:  Chest radiograph dated 01/23/2020. Chest CT dated 12/29/2019 FINDINGS: Cardiovascular: There is no cardiomegaly or pericardial effusion. Coronary vascular calcification primarily involving the LAD and left circumflex artery. There is moderate atherosclerotic calcification of the thoracic aorta. No aneurysmal dilatation or dissection. Evaluation of the pulmonary arteries is limited due to suboptimal opacification. No definite pulmonary artery embolus identified. Mediastinum/Nodes: There is no hilar or mediastinal adenopathy. Right hilar lymph node measures 7 mm in short axis. The esophagus is grossly unremarkable. No mediastinal fluid collection. Lungs/Pleura: There is a small right pleural effusion, decreased in size since the  prior CT. Right upper lobe streaky and nodular densities most consistent with pneumonia. Overall improvement of the right upper lobe densities since the prior CT. Clinical correlation and continued follow-up recommended. Additional nodular densities in the left apex and left lower lobe. There is no pneumothorax. The central airways are patent. There is mild bronchial wall thickening most consistent with bronchitis. Biapical scarring noted. Upper Abdomen: Small gallstones. Musculoskeletal: Degenerative changes of the spine. No acute osseous pathology. IMPRESSION: 1. Right upper lobe pneumonia, improved since the prior CT. Clinical correlation and continued follow-up recommended. 2. Small right pleural effusion, decreased in size since the prior CT. 3. Mild bronchial wall thickening most consistent with bronchitis. 4. Cholelithiasis. 5. Aortic Atherosclerosis (ICD10-I70.0). Electronically Signed   By: AAnner CreteM.D.   On: 01/23/2020 17:23   MR BRAIN WO CONTRAST  Result Date: 02/11/2020 CLINICAL DATA:  Subacute neuro deficit EXAM: MRI HEAD WITHOUT CONTRAST TECHNIQUE: Multiplanar, multiecho pulse sequences of the brain and surrounding structures were obtained without intravenous contrast. COMPARISON:  02/17/2009 FINDINGS: Brain: No acute infarction, hemorrhage, hydrocephalus, extra-axial collection or mass lesion. Cerebral volume loss and ventriculomegaly that is progressed from 2010. No significant ischemic changes. Vascular: Normal flow voids Skull and upper cervical spine: Normal marrow signal Sinuses/Orbits: Marked atrophy of the right optic nerve compared to the left, see coronal reformats. There is history of blindness on the right per the chart. Bilateral mastoid opacification. Left sphenoid sinus opacification. IMPRESSION: 1. No acute or reversible finding. 2. Brain atrophy that has progressed from 2010. 3. Left sphenoid sinus and bilateral mastoid opacification with negative nasopharynx.  Electronically Signed   By: JMonte FantasiaM.D.   On: 02/11/2020 19:17   CT Abdomen Pelvis W Contrast  Result Date: 02/11/2020 CLINICAL DATA:  Nausea and vomiting. Weakness. Recent pneumonia EXAM: CT ABDOMEN AND PELVIS WITH CONTRAST TECHNIQUE: Multidetector CT imaging of the abdomen and pelvis was performed using the standard protocol following bolus administration of intravenous contrast. CONTRAST:  1027mOMNIPAQUE IOHEXOL 300 MG/ML  SOLN COMPARISON:  None. FINDINGS: Lower Chest: No acute findings. Tiny pleural effusions noted bilaterally. Hepatobiliary: No hepatic masses identified. A few tiny sub-cm calcified gallstones are seen, however there is no evidence of cholecystitis or biliary dilatation. Pancreas:  No mass or inflammatory changes. Spleen: Within normal limits in size and appearance. Adrenals/Urinary Tract: No masses identified. No evidence of ureteral calculi or hydronephrosis. Stomach/Bowel: No evidence of obstruction, inflammatory process or abnormal fluid collections. Normal appendix visualized. Diverticulosis is seen mainly involving the sigmoid colon, however there is no evidence of diverticulitis. Vascular/Lymphatic: No pathologically enlarged lymph nodes. No abdominal aortic aneurysm. Aortic atherosclerosis noted. Reproductive:  No mass or other significant abnormality. Other:  None. Musculoskeletal:  No suspicious bone lesions identified. IMPRESSION: 1. Colonic diverticulosis, without radiographic evidence  of diverticulitis or other acute findings. 2. Cholelithiasis. No radiographic evidence of cholecystitis. 3. Tiny bilateral pleural effusions. Aortic Atherosclerosis (ICD10-I70.0). Electronically Signed   By: Marlaine Hind M.D.   On: 02/11/2020 14:08   DG Chest Portable 1 View  Result Date: 01/23/2020 CLINICAL DATA:  Progressive weakness. Recent history of pneumonia. EXAM: PORTABLE CHEST 1 VIEW COMPARISON:  Chest x-ray dated 01/19/2020 and chest CT dated 12/29/2019 FINDINGS: The heart  size and pulmonary vascularity are normal. Aortic atherosclerosis. Chronic blunting of the right costophrenic angle laterally. Chronic slight parenchymal scarring in the right midzone. Left lung is clear. No acute bone abnormality. Moderate arthritic changes of both glenohumeral joints. IMPRESSION: 1. No acute abnormalities. 2.  Aortic Atherosclerosis (ICD10-I70.0). Electronically Signed   By: Lorriane Shire M.D.   On: 01/23/2020 13:45    Time Spent in minutes  30   Lala Lund M.D on 02/17/2020 at 10:03 AM  To page go to [email or web address] - password [facility]

## 2020-02-17 NOTE — Discharge Instructions (Signed)
Follow with Primary MD [name], MD in 7 days   Get CBC, CMP, checked next visit within 1 week by Primary MD or SNF MD    Activity: As tolerated with Full fall precautions use walker/cane & assistance as needed  Disposition SNF  Diet: Heart Healthy    Special Instructions: If you have smoked or chewed Tobacco  in the last 2 yrs please stop smoking, stop any regular Alcohol  and or any Recreational drug use.  On your next visit with your primary care physician please Get Medicines reviewed and adjusted.  Please request your Prim.MD to go over all Hospital Tests and Procedure/Radiological results at the follow up, please get all Hospital records sent to your Prim MD by signing hospital release before you go home.  If you experience worsening of your admission symptoms, develop shortness of breath, life threatening emergency, suicidal or homicidal thoughts you must seek medical attention immediately by calling 911 or calling your MD immediately  if symptoms less severe.  You Must read complete instructions/literature along with all the possible adverse reactions/side effects for all the Medicines you take and that have been prescribed to you. Take any new Medicines after you have completely understood and accpet all the possible adverse reactions/side effects.

## 2020-02-18 LAB — BASIC METABOLIC PANEL
Anion gap: 10 (ref 5–15)
BUN: 59 mg/dL — ABNORMAL HIGH (ref 8–23)
CO2: 28 mmol/L (ref 22–32)
Calcium: 7.9 mg/dL — ABNORMAL LOW (ref 8.9–10.3)
Chloride: 95 mmol/L — ABNORMAL LOW (ref 98–111)
Creatinine, Ser: 1.44 mg/dL — ABNORMAL HIGH (ref 0.61–1.24)
GFR calc Af Amer: 52 mL/min — ABNORMAL LOW (ref >60)
GFR calc non Af Amer: 45 mL/min — ABNORMAL LOW (ref >60)
Glucose, Bld: 131 mg/dL — ABNORMAL HIGH (ref 70–99)
Potassium: 3.7 mmol/L (ref 3.5–5.1)
Sodium: 133 mmol/L — ABNORMAL LOW (ref 135–145)

## 2020-02-18 LAB — CBC WITH DIFFERENTIAL/PLATELET
Abs Immature Granulocytes: 0.8 10*3/uL — ABNORMAL HIGH (ref 0.00–0.07)
Basophils Absolute: 0.1 10*3/uL (ref 0.0–0.1)
Basophils Relative: 0 %
Eosinophils Absolute: 0.2 10*3/uL (ref 0.0–0.5)
Eosinophils Relative: 1 %
HCT: 32.2 % — ABNORMAL LOW (ref 39.0–52.0)
Hemoglobin: 9.9 g/dL — ABNORMAL LOW (ref 13.0–17.0)
Immature Granulocytes: 3 %
Lymphocytes Relative: 13 %
Lymphs Abs: 3 10*3/uL (ref 0.7–4.0)
MCH: 26.5 pg (ref 26.0–34.0)
MCHC: 30.7 g/dL (ref 30.0–36.0)
MCV: 86.1 fL (ref 80.0–100.0)
Monocytes Absolute: 2.1 10*3/uL — ABNORMAL HIGH (ref 0.1–1.0)
Monocytes Relative: 9 %
Neutro Abs: 18 10*3/uL — ABNORMAL HIGH (ref 1.7–7.7)
Neutrophils Relative %: 74 %
Platelets: 612 10*3/uL — ABNORMAL HIGH (ref 150–400)
RBC: 3.74 MIL/uL — ABNORMAL LOW (ref 4.22–5.81)
RDW: 15.4 % (ref 11.5–15.5)
WBC: 24.1 10*3/uL — ABNORMAL HIGH (ref 4.0–10.5)
nRBC: 0 % (ref 0.0–0.2)

## 2020-02-18 LAB — MAGNESIUM: Magnesium: 1.8 mg/dL (ref 1.7–2.4)

## 2020-02-18 LAB — PROCALCITONIN: Procalcitonin: 0.31 ng/mL

## 2020-02-18 LAB — C-REACTIVE PROTEIN: CRP: 4.9 mg/dL — ABNORMAL HIGH (ref <1.0)

## 2020-02-18 LAB — SARS CORONAVIRUS 2 (TAT 6-24 HRS): SARS Coronavirus 2: NEGATIVE

## 2020-02-18 LAB — BRAIN NATRIURETIC PEPTIDE: B Natriuretic Peptide: 151.9 pg/mL — ABNORMAL HIGH (ref 0.0–100.0)

## 2020-02-18 MED ORDER — DOCUSATE SODIUM 100 MG PO CAPS
200.0000 mg | ORAL_CAPSULE | Freq: Two times a day (BID) | ORAL | Status: DC
  Administered 2020-02-18 – 2020-02-19 (×3): 200 mg via ORAL
  Filled 2020-02-18 (×3): qty 2

## 2020-02-18 MED ORDER — ZOLPIDEM TARTRATE 5 MG PO TABS
5.0000 mg | ORAL_TABLET | Freq: Every evening | ORAL | Status: DC | PRN
  Administered 2020-02-18: 5 mg via ORAL
  Filled 2020-02-18: qty 1

## 2020-02-18 MED ORDER — PREDNISONE 5 MG PO TABS: 15.0000 mg | ORAL_TABLET | Freq: Every day | ORAL | Status: DC

## 2020-02-18 MED ORDER — BISACODYL 5 MG PO TBEC
10.0000 mg | DELAYED_RELEASE_TABLET | Freq: Once | ORAL | Status: AC
  Administered 2020-02-18: 10 mg via ORAL
  Filled 2020-02-18: qty 2

## 2020-02-18 MED ORDER — MAGNESIUM HYDROXIDE 400 MG/5ML PO SUSP
30.0000 mL | Freq: Two times a day (BID) | ORAL | Status: AC
  Administered 2020-02-18 (×2): 30 mL via ORAL
  Filled 2020-02-18 (×2): qty 30

## 2020-02-18 MED ORDER — METHYLPREDNISOLONE SODIUM SUCC 40 MG IJ SOLR
25.0000 mg | Freq: Two times a day (BID) | INTRAMUSCULAR | Status: DC
  Administered 2020-02-18 (×2): 25 mg via INTRAVENOUS
  Filled 2020-02-18 (×2): qty 1

## 2020-02-18 MED ORDER — PREDNISONE 5 MG PO TABS: 25.0000 mg | ORAL_TABLET | Freq: Every day | ORAL | Status: DC

## 2020-02-18 NOTE — Progress Notes (Signed)
PROGRESS NOTE                                                                                                                                                                                                             Patient Demographics:    Jeffrey Ho, is a 85 y.o. male, DOB - [birth year], MNO:[id number]  Admit date - 02/11/2020   Admitting Physician Karmen Bongo, MD  Outpatient Primary MD for the patient is Redmond School, MD  LOS - 7  Chief Complaint  Patient presents with  . Weakness       Brief Narrative - CAYTON CUEVAS is a 85 y.o. male with medical history significant of R eye blindness; carotid artery occlusion; HTN; and HLD.  He was admitted from 3/28-30 for CAP with demand ischemia; he was discharged on Augmentin.  During that admission, he had heme positive stools but had stable Hgb and no overt bleeding and so was planned for outpatient GI f/u.  He returned from 4/20-23 with symptomatic anemia and hyponatremia.  EGD/colonoscopy performed with reflux erosive esophagitis and diverticulosis/polyps, he was subsequently discharged home presented back to the hospital again on 02/11/2020 with generalized weakness, worsening anemia, hyponatremia and excessive fatigue and was admitted to the hospital for further work-up.   Subjective:   Patient in bed, appears comfortable, denies any headache, no fever, no chest pain or pressure, no shortness of breath , no abdominal pain but mild nausea and says he has had no BM in the last 2 days. No focal weakness.    Assessment  & Plan :     1.  Failure to thrive, excessive, persistent fatigue and deconditioning with symptomatic recurrent anemia of chronic disease, high inflammatory markers including CRP, ESR and leukocytosis. he now appears to be hemodynamically stable, seen by hematology oncology, CT chest abdomen pelvis nonacute with no suspicion for malignancy.  His decline seems to be age-related although there could be a  small possibility of a lingering infection for which we will challenge him with 7 days of azithromycin and Flagyl since he has history of pneumonia and diverticulosis.  He has no abdominal pain or productive cough.  Of note he had recent EGD and colonoscopy in [place] by Dr. Gala Romney which was nonacute as well.  He received 1 unit of packed RBC by hematology on 02/13/2020 after which his H&H appears to be stable.  No signs of ongoing bleeding.  After detailed discussions with wife it appears that his main symptoms are  of excessive fatigue, question if he has underlying PMR, -ve ANA, stable CK 3 C4 levels, high myeloperoxidase antibody, high rheumatoid factor, pending ANCA, SPEP and UPEP levels are not normal however UPEP shows high Kappa and lambda light chains, SPEP shows high alpha-1 globulin and gammaglobulin, low albumin will defer to nephrology to interpret, he has no headache or visual problems.  He was started on a therapeutic trial of oral prednisone on 02/15/2020 and remarkably feels a whole lot better the next day, continue steroids and await results for antibody work-up for vasculitis, trend CRP ( improving).  Definitely require outpatient nephrology and vascular surgery follow-up.  For now he is much improved on steroids dose tapered to 20 mg, still running diagnosis is PMR.   2.  Deconditioning.  PT OT, may require placement.  MRI brain nonacute but does show atrophy.  3.  Hyponatremia with fluid overload and edema.  Likely due to third spacing of fluids due to severe protein calorie malnutrition, recent echo noted with preserved EF and mild diastolic dysfunction, no pulmonary edema hence I do not think this is CHF. Low-dose oral Lasix to continue if renal function can tolerate.  Protein supplementations being provided.  4.  Severe PCM causing third spacing of fluid and edema.  Has been placed on protein supplements, LFTs appear stable with stable bilirubin, UA has mild proteinuria.  Outpatient  nephrology follow-up would suffice.  5.  AKI.  This is likely due to contrast nephropathy he received IV contrast for his CT abdomen and pelvis, proved after gentle hydration on 02/13/2020, slightly worse again will hydrate gently on 02/14/2020.  Renal following appreciate their input.  He has increased light chains    6.  Dyslipidemia continue home dose statin.  7.  HX of pneumonia with lung scarring.  CT noncontrast stable see #1 above.  8.  Diverticulosis.  Stable.  Nonacute CT abdomen pelvis.  See #1 above and monitor.  No pain or diarrhea.  9.  Nausea and constipation.  Likely due to oral iron supplementation, placed on bowel regimen will monitor.  Abdominal exam is benign    Condition - Fair  Family Communication  :   Message left for wife Jeffrey Ho on [phone] on 02/12/2020 at 10:45 AM.  Message left 02/17/2020 at 10:10 AM  Wife updated bedside 02/13/20, 02/14/20, 02/15/20, 02/16/20, 02/18/20   Code Status :  DNR  Consults  :  Onc, Renal  Disposition Plan  :    Status is: Inpatient  Remains inpatient appropriate because:Ongoing diagnostic testing needed not appropriate for outpatient work up   Dispo: The patient is from: Home              Anticipated d/c is to: SNF              Anticipated d/c date is: Likely 02/18/2020 if work-up is stable               Patient currently is not medically stable to d/c.  Had recurrent admissions for failure to thrive and fatigue, hematological work-up is being done to rule out underlying malignancy.   Procedures  :    CT Chest - Non acute  CT abdomen and pelvis with contrast -  1. Colonic diverticulosis, without radiographic evidence of diverticulitis or other acute findings. 2. Cholelithiasis. No radiographic evidence of cholecystitis. 3. Tiny bilateral pleural effusions. Aortic Atherosclerosis  MRI brain - Non acute   PUD Prophylaxis : PPI  DVT Prophylaxis  :  Lovenox  Lab Results  Component Value Date   PLT 612 (H) 02/18/2020      Diet :  Diet Order            Diet regular Room service appropriate? Yes; Fluid consistency: Thin  Diet effective now               Inpatient Medications Scheduled Meds: . sodium chloride   Intravenous Once  . atorvastatin  20 mg Oral Daily  . azithromycin  250 mg Oral Daily  . docusate sodium  200 mg Oral BID  . enoxaparin (LOVENOX) injection  40 mg Subcutaneous Q24H  . feeding supplement (ENSURE ENLIVE)  237 mL Oral TID BM  . feeding supplement (PRO-STAT SUGAR FREE 64)  30 mL Oral TID WC  . ferrous sulfate  325 mg Oral BID WC  . folic acid  1 mg Oral Daily  . furosemide  40 mg Oral BID  . magnesium hydroxide  30 mL Oral BID  . metroNIDAZOLE  500 mg Oral Q8H  . pantoprazole  40 mg Oral Daily  . potassium chloride  40 mEq Oral Daily  . [START ON 02/19/2020] predniSONE  15 mg Oral Q breakfast   Continuous Infusions:  PRN Meds:.acetaminophen **OR** [DISCONTINUED] acetaminophen, hydrALAZINE, menthol-cetylpyridinium, [DISCONTINUED] ondansetron **OR** ondansetron (ZOFRAN) IV, polyethylene glycol, sodium chloride, zolpidem  Antibiotics  :   Anti-infectives (From admission, onward)   Start     Dose/Rate Route Frequency Ordered Stop   02/16/20 0600  metroNIDAZOLE (FLAGYL) tablet 500 mg     500 mg Oral Every 8 hours 02/15/20 1717 02/20/20 1359   02/13/20 1000  azithromycin (ZITHROMAX) tablet 250 mg     250 mg Oral Daily 02/13/20 0811 02/20/20 0959   02/13/20 0900  metroNIDAZOLE (FLAGYL) IVPB 500 mg  Status:  Discontinued     500 mg 100 mL/hr over 60 Minutes Intravenous Every 8 hours 02/13/20 0811 02/15/20 1718          Objective:   Vitals:   02/18/20 0100 02/18/20 0551 02/18/20 0652 02/18/20 1041  BP: (!) 151/78 (!) 123/102 127/64 121/60  Pulse: 92 (!) 111 95 89  Resp: (!) 24 (!) _0 Temp: 98.7 F (37.1 C) 98.4 F (36.9 C) 99.2 F (37.3 C) (!) 97.5 F (36.4 C)  TempSrc: Oral Oral Oral Oral  SpO2: 100% 100% 99% 97%  Weight:      Height:        SpO2:  97 %  Wt Readings from Last 3 Encounters:  02/17/20 97.2 kg  01/23/20 97.5 kg  01/01/20 102 kg     Intake/Output Summary (Last 24 hours) at 02/18/2020 1056 Last data filed at 02/18/2020 0615 Gross per 24 hour  Intake 640 ml  Output 4075 ml  Net -3435 ml     Physical Exam  Awake Alert, No new F.N deficits, bilateral hearing loss, 1+ edema with TED stockings in place, Jeffrey Ho,PERRAL Supple Neck,No JVD, No cervical lymphadenopathy appriciated.  Symmetrical Chest wall movement, Good air movement bilaterally, CTAB RRR,No Gallops, Rubs or new Murmurs, No Parasternal Heave +ve B.Sounds, Abd Soft, No tenderness, No organomegaly appriciated, No rebound - guarding or rigidity. No Cyanosis, Clubbing or edema, No new Rash or bruise     Data Review:    Recent Labs  Lab 02/14/20 0617 02/15/20 0540 02/16/20 0548 02/17/20 0353 02/18/20 0420  WBC 23.8* 24.3* 21.2* 20.3* 24.1*  HGB 9.5* 8.8* 7.9* 8.2* 9.9*  HCT 30.2* 27.9* 25.9* 26.4* 32.2*  PLT 428*  458* 399 474* 612*  MCV 85.8 85.3 86.0 86.3 86.1  MCH 27.0 26.9 26.2 26.8 26.5  MCHC 31.5 31.5 30.5 31.1 30.7  RDW 15.2 15.3 15.0 15.1 15.4  LYMPHSABS 2.8 3.1 2.8 2.6 3.0  MONOABS 1.4* 1.5* 1.2* 1.4* 2.1*  EOSABS 0.8* 0.8* 0.0 0.0 0.2  BASOSABS 0.1 0.1 0.0 0.0 0.1    Recent Labs  Lab 02/11/20 1222 02/11/20 1222 02/12/20 0357 02/12/20 0739 02/13/20 0317 02/13/20 0317 02/14/20 0617 02/15/20 0540 02/16/20 0548 02/17/20 0353 02/18/20 0420  NA 129*   < >   < >  --  128*   < > 127* 128* 129* 132* 133*  K 4.2   < >   < >  --  3.6   < > 4.0 3.6 3.8 3.4* 3.7  CL 95*   < >   < >  --  94*   < > 93* 93* 94* 96* 95*  CO2 26   < >   < >  --  23   < > _0 GLUCOSE 108*   < >   < >  --  114*   < > 125* 115* 145* 174* 131*  BUN 20   < >   < >  --  23   < > 26* 35* 51* 61* 59*  CREATININE 1.23   < >   < >  --  1.28*   < > 1.47* 1.42* 1.49* 1.40* 1.44*  CALCIUM 7.9*   < >   < >  --  7.5*   < > 7.5* 7.4* 7.7* 7.7* 7.9*  AST 25   --   --   --  37  --  54* 38 26  --   --   ALT 24  --   --   --  25  --  38 34 27  --   --   ALKPHOS 190*  --   --   --  184*  --  174* 165* 122  --   --   BILITOT 0.6  --   --   --  0.8  --  0.7 0.8 0.5  --   --   ALBUMIN 1.4*  --   --   --  1.2*  --  1.2* 1.1* 1.2*  --   --   MG  --   --   --   --  1.7   < > 1.8 1.8 1.9 2.0 1.8  CRP  --   --   --   --  19.9*   < > 18.5* 16.9* 14.8* 7.3* 4.9*  PROCALCITON  --    < >  --  0.53 0.58   < > 0.54 0.73 0.53 0.33 0.31  TSH  --   --   --  2.638  --   --   --   --   --   --   --   BNP 191.8*   < >  --   --  159.4*   < > 198.6* 149.3* 182.6* 211.3* 151.9*   < > = values in this interval not displayed.    Recent Labs  Lab 02/11/20 1601 02/12/20 0739 02/14/20 0617 02/15/20 0540 02/16/20 0548 02/17/20 0353 02/18/20 0420  CRP  --    < > 18.5* 16.9* 14.8* 7.3* 4.9*  BNP  --    < > 198.6* 149.3* 182.6* 211.3* 151.9*  PROCALCITON  --    < >  0.54 0.73 0.53 0.33 0.31  SARSCOV2NAA NEGATIVE  --   --   --   --   --   --    < > = values in this interval not displayed.    ------------------------------------------------------------------------------------------------------------------ No results for input(s): CHOL, HDL, LDLCALC, TRIG, CHOLHDL, LDLDIRECT in the last 72 hours.  Lab Results  Component Value Date   HGBA1C 6.2 (H) 01/01/2020   ------------------------------------------------------------------------------------------------------------------ No results for input(s): TSH, T4TOTAL, T3FREE, THYROIDAB in the last 72 hours.  Invalid input(s): FREET3 ------------------------------------------------------------------------------------------------------------------ No results for input(s): VITAMINB12, FOLATE, FERRITIN, TIBC, IRON, RETICCTPCT in the last 72 hours.  Coagulation profile No results for input(s): INR, PROTIME in the last 168 hours.  No results for input(s): DDIMER in the last 72 hours.  Cardiac Enzymes No results for input(s):  CKMB, TROPONINI, MYOGLOBIN in the last 168 hours.  Invalid input(s): CK ------------------------------------------------------------------------------------------------------------------    Component Value Date/Time   BNP 151.9 (H) 02/18/2020 0420    Micro Results Recent Results (from the past 240 hour(s))  Respiratory Panel by RT PCR (Flu A&B, Covid) - Nasopharyngeal Swab     Status: None   Collection Time: 02/11/20  4:01 PM   Specimen: Nasopharyngeal Swab  Result Value Ref Range Status   SARS Coronavirus 2 by RT PCR NEGATIVE NEGATIVE Final    Comment: (NOTE) SARS-CoV-2 target nucleic acids are NOT DETECTED. The SARS-CoV-2 RNA is generally detectable in upper respiratoy specimens during the acute phase of infection. The lowest concentration of SARS-CoV-2 viral copies this assay can detect is 131 copies/mL. A negative result does not preclude SARS-Cov-2 infection and should not be used as the sole basis for treatment or other patient management decisions. A negative result may occur with  improper specimen collection/handling, submission of specimen other than nasopharyngeal swab, presence of viral mutation(s) within the areas targeted by this assay, and inadequate number of viral copies (<131 copies/mL). A negative result must be combined with clinical observations, patient history, and epidemiological information. The expected result is Negative. Fact Sheet for Patients:  PinkCheek.be Fact Sheet for Healthcare Providers:  GravelBags.it This test is not yet ap proved or cleared by the [place] FDA and  has been authorized for detection and/or diagnosis of SARS-CoV-2 by FDA under an Emergency Use Authorization (EUA). This EUA will remain  in effect (meaning this test can be used) for the duration of the COVID-19 declaration under Section 564(b)(1) of the Act, 21 U.S.C. section 360bbb-3(b)(1), unless the authorization  is terminated or revoked sooner.    Influenza A by PCR NEGATIVE NEGATIVE Final   Influenza B by PCR NEGATIVE NEGATIVE Final    Comment: (NOTE) The Xpert Xpress SARS-CoV-2/FLU/RSV assay is intended as an aid in  the diagnosis of influenza from Nasopharyngeal swab specimens and  should not be used as a sole basis for treatment. Nasal washings and  aspirates are unacceptable for Xpert Xpress SARS-CoV-2/FLU/RSV  testing. Fact Sheet for Patients: PinkCheek.be Fact Sheet for Healthcare Providers: GravelBags.it This test is not yet approved or cleared by the [place] FDA and  has been authorized for detection and/or diagnosis of SARS-CoV-2 by  FDA under an Emergency Use Authorization (EUA). This EUA will remain  in effect (meaning this test can be used) for the duration of the  Covid-19 declaration under Section 564(b)(1) of the Act, 21  U.S.C. section 360bbb-3(b)(1), unless the authorization is  terminated or revoked. Performed at [facility], [place] [address], [place], [place] [address]   Culture, blood (  routine x 2)     Status: None   Collection Time: 02/11/20  9:26 PM   Specimen: BLOOD  Result Value Ref Range Status   Specimen Description BLOOD LEFT ANTECUBITAL  Final   Special Requests   Final    BOTTLES DRAWN AEROBIC AND ANAEROBIC Blood Culture adequate volume   Culture   Final    NO GROWTH 5 DAYS Performed at [facility], [address] [place], [place] [address]    Report Status 02/16/2020 FINAL  Final  Culture, blood (routine x 2)     Status: None   Collection Time: 02/11/20  9:53 PM   Specimen: BLOOD RIGHT HAND  Result Value Ref Range Status   Specimen Description BLOOD RIGHT HAND  Final   Special Requests   Final    BOTTLES DRAWN AEROBIC ONLY Blood Culture adequate volume   Culture   Final    NO GROWTH 5 DAYS Performed at [facility], [place] [address], [place], [place] [address]     Report Status 02/16/2020 FINAL  Final    Radiology Reports DG Chest 2 View  Result Date: 02/11/2020 CLINICAL DATA:  Shortness of breath EXAM: CHEST - 2 VIEW COMPARISON:  January 23, 2020 FINDINGS: There is either scarring in the lateral right base or small right pleural effusion. There is apparent scarring in the right mid lung as well. Lungs elsewhere are clear. Heart size and pulmonary vascularity are normal. There is aortic atherosclerosis. No adenopathy. There is degenerative change in the thoracic spine. IMPRESSION: Scarring right mid lung. Question scarring lateral right base versus small right pleural effusion. Lungs elsewhere clear. Stable cardiac silhouette.  Aortic Atherosclerosis (ICD10-I70.0). Electronically Signed   By: Lowella Grip III M.D.   On: 02/11/2020 11:40   DG Chest 2 View  Result Date: 01/21/2020 CLINICAL DATA:  Pneumonia 2 months ago. Dry cough. EXAM: CHEST - 2 VIEW COMPARISON:  Chest x-ray dated 12/25/2019. FINDINGS: Improved aeration at the RIGHT lung base/costophrenic angle. No new opacity. Lungs are hyperexpanded. Chronic bronchitic changes noted centrally. Heart size and mediastinal contours are within normal limits. No acute or suspicious osseous finding. IMPRESSION: 1. No active cardiopulmonary disease. No evidence of pneumonia or pulmonary edema on today's exam. 2. Improved aeration at the RIGHT lung base compared to the previous chest x-ray of 12/25/2019. 3. Hyperexpanded lungs indicating COPD. Associated chronic bronchitic changes centrally. Electronically Signed   By: Franki [place] M.D.   On: 01/21/2020 17:09   CT CHEST WO CONTRAST  Result Date: 02/12/2020 CLINICAL DATA:  Cancer of unknown primary. EXAM: CT CHEST WITHOUT CONTRAST TECHNIQUE: Multidetector CT imaging of the chest was performed following the standard protocol without IV contrast. COMPARISON:  January 23, 2020. FINDINGS: Cardiovascular: Atherosclerosis of thoracic aorta is noted without aneurysm  formation. Normal cardiac size. No pericardial effusion. Coronary artery calcifications are noted. Mediastinum/Nodes: No enlarged mediastinal or axillary lymph nodes. Thyroid gland, trachea, and esophagus demonstrate no significant findings. Lungs/Pleura: No pneumothorax is noted. Minimal right pleural effusion is noted. Minimal bilateral posterior basilar subsegmental atelectasis is noted. Stable left apical scarring is noted. Stable right middle lobe scarring is noted. Stable right posterior upper lobe opacity is noted concerning for pneumonia or possibly scarring. Upper Abdomen: No acute abnormality. Musculoskeletal: No chest wall mass or suspicious bone lesions identified. IMPRESSION: 1. Coronary artery calcifications are noted suggesting coronary artery disease. 2. Minimal right pleural effusion is noted. 3. Stable right posterior upper lobe opacity is noted concerning for pneumonia  or possibly scarring. Stable right middle lobe scarring is noted. 4. Stable left apical scarring is noted. 5. Minimal bilateral posterior basilar subsegmental atelectasis is noted. Aortic Atherosclerosis (ICD10-I70.0). Electronically Signed   By: Marijo Conception M.D.   On: 02/12/2020 11:46   CT Chest W Contrast  Result Date: 01/23/2020 CLINICAL DATA:  85 year old male with generalized weakness. EXAM: CT CHEST WITH CONTRAST TECHNIQUE: Multidetector CT imaging of the chest was performed during intravenous contrast administration. CONTRAST:  79m OMNIPAQUE IOHEXOL 300 MG/ML  SOLN COMPARISON:  Chest radiograph dated 01/23/2020. Chest CT dated 12/29/2019 FINDINGS: Cardiovascular: There is no cardiomegaly or pericardial effusion. Coronary vascular calcification primarily involving the LAD and left circumflex artery. There is moderate atherosclerotic calcification of the thoracic aorta. No aneurysmal dilatation or dissection. Evaluation of the pulmonary arteries is limited due to suboptimal opacification. No definite pulmonary artery  embolus identified. Mediastinum/Nodes: There is no hilar or mediastinal adenopathy. Right hilar lymph node measures 7 mm in short axis. The esophagus is grossly unremarkable. No mediastinal fluid collection. Lungs/Pleura: There is a small right pleural effusion, decreased in size since the prior CT. Right upper lobe streaky and nodular densities most consistent with pneumonia. Overall improvement of the right upper lobe densities since the prior CT. Clinical correlation and continued follow-up recommended. Additional nodular densities in the left apex and left lower lobe. There is no pneumothorax. The central airways are patent. There is mild bronchial wall thickening most consistent with bronchitis. Biapical scarring noted. Upper Abdomen: Small gallstones. Musculoskeletal: Degenerative changes of the spine. No acute osseous pathology. IMPRESSION: 1. Right upper lobe pneumonia, improved since the prior CT. Clinical correlation and continued follow-up recommended. 2. Small right pleural effusion, decreased in size since the prior CT. 3. Mild bronchial wall thickening most consistent with bronchitis. 4. Cholelithiasis. 5. Aortic Atherosclerosis (ICD10-I70.0). Electronically Signed   By: AAnner CreteM.D.   On: 01/23/2020 17:23   MR BRAIN WO CONTRAST  Result Date: 02/11/2020 CLINICAL DATA:  Subacute neuro deficit EXAM: MRI HEAD WITHOUT CONTRAST TECHNIQUE: Multiplanar, multiecho pulse sequences of the brain and surrounding structures were obtained without intravenous contrast. COMPARISON:  02/17/2009 FINDINGS: Brain: No acute infarction, hemorrhage, hydrocephalus, extra-axial collection or mass lesion. Cerebral volume loss and ventriculomegaly that is progressed from 2010. No significant ischemic changes. Vascular: Normal flow voids Skull and upper cervical spine: Normal marrow signal Sinuses/Orbits: Marked atrophy of the right optic nerve compared to the left, see coronal reformats. There is history of blindness  on the right per the chart. Bilateral mastoid opacification. Left sphenoid sinus opacification. IMPRESSION: 1. No acute or reversible finding. 2. Brain atrophy that has progressed from 2010. 3. Left sphenoid sinus and bilateral mastoid opacification with negative nasopharynx. Electronically Signed   By: JMonte FantasiaM.D.   On: 02/11/2020 19:17   CT Abdomen Pelvis W Contrast  Result Date: 02/11/2020 CLINICAL DATA:  Nausea and vomiting. Weakness. Recent pneumonia EXAM: CT ABDOMEN AND PELVIS WITH CONTRAST TECHNIQUE: Multidetector CT imaging of the abdomen and pelvis was performed using the standard protocol following bolus administration of intravenous contrast. CONTRAST:  1018mOMNIPAQUE IOHEXOL 300 MG/ML  SOLN COMPARISON:  None. FINDINGS: Lower Chest: No acute findings. Tiny pleural effusions noted bilaterally. Hepatobiliary: No hepatic masses identified. A few tiny sub-cm calcified gallstones are seen, however there is no evidence of cholecystitis or biliary dilatation. Pancreas:  No mass or inflammatory changes. Spleen: Within normal limits in size and appearance. Adrenals/Urinary Tract: No masses identified. No evidence of ureteral calculi or hydronephrosis.  Stomach/Bowel: No evidence of obstruction, inflammatory process or abnormal fluid collections. Normal appendix visualized. Diverticulosis is seen mainly involving the sigmoid colon, however there is no evidence of diverticulitis. Vascular/Lymphatic: No pathologically enlarged lymph nodes. No abdominal aortic aneurysm. Aortic atherosclerosis noted. Reproductive:  No mass or other significant abnormality. Other:  None. Musculoskeletal:  No suspicious bone lesions identified. IMPRESSION: 1. Colonic diverticulosis, without radiographic evidence of diverticulitis or other acute findings. 2. Cholelithiasis. No radiographic evidence of cholecystitis. 3. Tiny bilateral pleural effusions. Aortic Atherosclerosis (ICD10-I70.0). Electronically Signed   By: Marlaine Hind M.D.   On: 02/11/2020 14:08   DG Chest Portable 1 View  Result Date: 01/23/2020 CLINICAL DATA:  Progressive weakness. Recent history of pneumonia. EXAM: PORTABLE CHEST 1 VIEW COMPARISON:  Chest x-ray dated 01/19/2020 and chest CT dated 12/29/2019 FINDINGS: The heart size and pulmonary vascularity are normal. Aortic atherosclerosis. Chronic blunting of the right costophrenic angle laterally. Chronic slight parenchymal scarring in the right midzone. Left lung is clear. No acute bone abnormality. Moderate arthritic changes of both glenohumeral joints. IMPRESSION: 1. No acute abnormalities. 2.  Aortic Atherosclerosis (ICD10-I70.0). Electronically Signed   By: Lorriane Shire M.D.   On: 01/23/2020 13:45    Time Spent in minutes  30   Lala Lund M.D on 02/18/2020 at 10:56 AM  To page go to [email or web address] - password [email or web address]

## 2020-02-18 NOTE — Plan of Care (Signed)
  Problem: Education: Goal: Knowledge of General Education information will improve Description Including pain rating scale, medication(s)/side effects and non-pharmacologic comfort measures Outcome: Progressing   

## 2020-02-18 NOTE — TOC Progression Note (Addendum)
Transition of Care [facility]) - Progression Note    Patient Details  Name: Jeffrey Ho MRN: [id number] Date of Birth: 1934-10-10  Transition of Care [facility]) CM/SW Contact  Nonda Lou, [place] Phone Number: 02/18/2020, 9:45 AM  Clinical Narrative:    CSW made call to Tammy with [facility] to inquire on patient discharging to facility today, left a message & awaiting a call back.   CSW contacted the facility directly and was unable to make contact with anyone, will continue to follow-up.   Expected Discharge Plan: Skilled Nursing Facility Barriers to Discharge: Continued Medical Work up  Expected Discharge Plan and Services Expected Discharge Plan: Skilled Nursing Facility       Living arrangements for the past 2 months: Single Family Home                                       Social Determinants of Health (SDOH) Interventions    Readmission Risk Interventions No flowsheet data found.

## 2020-02-19 ENCOUNTER — Inpatient Hospital Stay
Admission: RE | Admit: 2020-02-19 | Discharge: 2020-02-27 | Disposition: A | Discharge status: Skilled Nursing Facility | Payer: Medicare Other | Source: Ambulatory Visit | Attending: Internal Medicine | Admitting: Internal Medicine

## 2020-02-19 LAB — BASIC METABOLIC PANEL
Anion gap: 7 (ref 5–15)
BUN: 63 mg/dL — ABNORMAL HIGH (ref 8–23)
CO2: 29 mmol/L (ref 22–32)
Calcium: 7.9 mg/dL — ABNORMAL LOW (ref 8.9–10.3)
Chloride: 96 mmol/L — ABNORMAL LOW (ref 98–111)
Creatinine, Ser: 1.47 mg/dL — ABNORMAL HIGH (ref 0.61–1.24)
GFR calc Af Amer: 51 mL/min — ABNORMAL LOW (ref >60)
GFR calc non Af Amer: 44 mL/min — ABNORMAL LOW (ref >60)
Glucose, Bld: 137 mg/dL — ABNORMAL HIGH (ref 70–99)
Potassium: 4.1 mmol/L (ref 3.5–5.1)
Sodium: 132 mmol/L — ABNORMAL LOW (ref 135–145)

## 2020-02-19 LAB — CBC WITH DIFFERENTIAL/PLATELET
Abs Immature Granulocytes: 0.78 10*3/uL — ABNORMAL HIGH (ref 0.00–0.07)
Basophils Absolute: 0 10*3/uL (ref 0.0–0.1)
Basophils Relative: 0 %
Eosinophils Absolute: 0 10*3/uL (ref 0.0–0.5)
Eosinophils Relative: 0 %
HCT: 28.3 % — ABNORMAL LOW (ref 39.0–52.0)
Hemoglobin: 8.8 g/dL — ABNORMAL LOW (ref 13.0–17.0)
Immature Granulocytes: 3 %
Lymphocytes Relative: 15 %
Lymphs Abs: 3.6 10*3/uL (ref 0.7–4.0)
MCH: 26.7 pg (ref 26.0–34.0)
MCHC: 31.1 g/dL (ref 30.0–36.0)
MCV: 85.8 fL (ref 80.0–100.0)
Monocytes Absolute: 1.8 10*3/uL — ABNORMAL HIGH (ref 0.1–1.0)
Monocytes Relative: 7 %
Neutro Abs: 17.8 10*3/uL — ABNORMAL HIGH (ref 1.7–7.7)
Neutrophils Relative %: 75 %
Platelets: 485 10*3/uL — ABNORMAL HIGH (ref 150–400)
RBC: 3.3 MIL/uL — ABNORMAL LOW (ref 4.22–5.81)
RDW: 15.4 % (ref 11.5–15.5)
WBC: 24 10*3/uL — ABNORMAL HIGH (ref 4.0–10.5)
nRBC: 0 % (ref 0.0–0.2)

## 2020-02-19 LAB — UPEP/UIFE/LIGHT CHAINS/TP, 24-HR UR
% BETA, Urine: 30.2 %
ALPHA 1 URINE: 3 %
Albumin, U: 7.3 %
Alpha 2, Urine: 11.3 %
Free Kappa Lt Chains,Ur: 1228.33 mg/L — ABNORMAL HIGH (ref 0.63–113.79)
Free Kappa/Lambda Ratio: 6.31 (ref 1.03–31.76)
Free Lambda Lt Chains,Ur: 194.62 mg/L — ABNORMAL HIGH (ref 0.47–11.77)
GAMMA GLOBULIN URINE: 48.2 %
Total Protein, Urine-Ur/day: 684 mg/24 hr — ABNORMAL HIGH (ref 30–150)
Total Protein, Urine: 45.6 mg/dL
Total Volume: 1500

## 2020-02-19 LAB — BRAIN NATRIURETIC PEPTIDE: B Natriuretic Peptide: 119.9 pg/mL — ABNORMAL HIGH (ref 0.0–100.0)

## 2020-02-19 LAB — ANCA TITERS
Atypical P-ANCA titer: <1:20 {titer}
Atypical P-ANCA titer: <1:20 {titer}
C-ANCA: 1:160 {titer} — ABNORMAL HIGH
C-ANCA: 1:40 {titer} — ABNORMAL HIGH
P-ANCA: <1:20 {titer}
P-ANCA: <1:20 {titer}

## 2020-02-19 LAB — MAGNESIUM: Magnesium: 2.1 mg/dL (ref 1.7–2.4)

## 2020-02-19 LAB — C-REACTIVE PROTEIN: CRP: 10.1 mg/dL — ABNORMAL HIGH (ref <1.0)

## 2020-02-19 LAB — PROCALCITONIN: Procalcitonin: 0.28 ng/mL

## 2020-02-19 MED ORDER — POLYETHYLENE GLYCOL 3350 17 G PO PACK
17.0000 g | PACK | Freq: Every day | ORAL | 0 refills | Status: DC
Start: 2020-02-19 — End: 2020-02-28

## 2020-02-19 MED ORDER — METRONIDAZOLE 500 MG PO TABS: 500.0000 mg | ORAL_TABLET | Freq: Three times a day (TID) | ORAL | Status: AC

## 2020-02-19 MED ORDER — PREDNISONE 50 MG PO TABS: 60.0000 mg | ORAL_TABLET | Freq: Every day | ORAL | Status: DC

## 2020-02-19 MED ORDER — CYCLOPHOSPHAMIDE 25 MG PO CAPS
100.0000 mg | ORAL_CAPSULE | Freq: Every day | ORAL | Status: DC
  Administered 2020-02-19: 100 mg via ORAL
  Filled 2020-02-19: qty 2
  Filled 2020-02-19: qty 4

## 2020-02-19 MED ORDER — PREDNISONE 20 MG PO TABS
30.0000 mg | ORAL_TABLET | Freq: Every day | ORAL | Status: DC
  Administered 2020-02-19: 30 mg via ORAL
  Filled 2020-02-19: qty 1

## 2020-02-19 MED ORDER — AZITHROMYCIN 250 MG PO TABS: 250.0000 mg | ORAL_TABLET | Freq: Every day | ORAL | 0 refills | Status: AC

## 2020-02-19 MED ORDER — PREDNISONE 10 MG PO TABS: 30.0000 mg | ORAL_TABLET | Freq: Every day | ORAL | Status: DC

## 2020-02-19 MED ORDER — ENSURE ENLIVE PO LIQD: 237.0000 mL | Freq: Three times a day (TID) | ORAL | 12 refills | Status: DC

## 2020-02-19 MED ORDER — FUROSEMIDE 40 MG PO TABS: 40.0000 mg | ORAL_TABLET | Freq: Every day | ORAL | Status: DC

## 2020-02-19 MED ORDER — POTASSIUM CHLORIDE CRYS ER 10 MEQ PO TBCR: 10.0000 meq | EXTENDED_RELEASE_TABLET | Freq: Every day | ORAL | Status: DC

## 2020-02-19 MED ORDER — DOCUSATE SODIUM 100 MG PO CAPS
200.0000 mg | ORAL_CAPSULE | Freq: Two times a day (BID) | ORAL | 2 refills | Status: DC
Start: 2020-02-19 — End: 2020-02-27

## 2020-02-19 MED ORDER — FERROUS SULFATE 325 (65 FE) MG PO TABS: 325.0000 mg | ORAL_TABLET | Freq: Two times a day (BID) | ORAL | Status: DC

## 2020-02-19 MED ORDER — PREDNISONE 50 MG PO TABS: 50.0000 mg | ORAL_TABLET | Freq: Every day | ORAL | Status: DC

## 2020-02-19 MED ORDER — CYCLOPHOSPHAMIDE 50 MG PO CAPS: 100.0000 mg | ORAL_CAPSULE | Freq: Every day | ORAL | 3 refills | Status: DC

## 2020-02-19 MED ORDER — PREDNISONE 20 MG PO TABS: 60.0000 mg | ORAL_TABLET | Freq: Every day | ORAL | 1 refills | Status: DC

## 2020-02-19 NOTE — Discharge Summary (Addendum)
Jeffrey Ho RWC:[id number] DOB: 29-Jun-1935 DOA: 02/11/2020  PCP: Redmond School, MD  Admit date: 02/11/2020  Discharge date: 02/19/2020  Admitted From: Home   Disposition:  SNF   Recommendations for Outpatient Follow-up:   Follow up with PCP in 1-2 weeks  PCP Please obtain BMP/CBC, 2 view CXR in 1week,  (see Discharge instructions)   PCP Please follow up on the following pending results: Monitor CBC, BMP in 3 to 5 days, needs outpatient nephrology and rheumatology follow-up for possible underlying vasculitis and CKD.   Home Health: None   Equipment/Devices: None  Consultations: Renal. Onc Discharge Condition: Fair   CODE STATUS: DNR   Diet Recommendation: Heart Healthy   Diet Order            Diet - low sodium heart healthy        Diet regular Room service appropriate? Yes; Fluid consistency: Thin  Diet effective now               Chief Complaint  Patient presents with  . Weakness     Brief history of present illness from the day of admission and additional interim summary    Jeffrey Ho a 85 y.o.malewith medical history significant ofR eye blindness; carotid artery occlusion; HTN; and HLD. He was admitted from 3/28-30 for CAP with demand ischemia; he was discharged on Augmentin. During that admission, he had heme positive stools but had stable Hgb and no overt bleeding and so was planned for outpatient GI f/u. He returned from 4/20-23 with symptomatic anemia and hyponatremia. EGD/colonoscopy performed with reflux erosive esophagitis and diverticulosis/polyps, he was subsequently discharged home presented back to the hospital again on 02/11/2020 with generalized weakness, worsening anemia, hyponatremia and excessive fatigue and was admitted to the hospital for further work-up.                                                            Hospital Course    1.  Failure to thrive, excessive, persistent fatigue and deconditioning with symptomatic recurrent anemia of chronic disease, high inflammatory markers including CRP, ESR and leukocytosis. he now appears to be hemodynamically stable, seen by hematology oncology, CT chest abdomen pelvis nonacute with no suspicion for malignancy.  His decline seems to be age-related although there could be a small possibility of a lingering infection for which we will challenge him with 7 days of azithromycin and Flagyl since he has history of pneumonia and diverticulosis with possible occult lumbar: Infection, stop date of antibiotics 02/22/2020.  He has no abdominal pain or productive cough.  Of note he had recent EGD and colonoscopy in [place] by Dr. Gala Romney which was nonacute as well.  He received 1 unit of packed RBC by hematology on 02/13/2020 after which his H&H appears to be stable.  No signs of ongoing  bleeding.  After detailed discussions with wife it appears that his main symptoms are of excessive fatigue, question if he has underlying PMR, -ve ANA, stable CK 3 C4 levels, high myeloperoxidase antibody, high rheumatoid factor, pending ANCA, SPEP and UPEP levels are not normal however UPEP shows high Kappa and lambda light chains, SPEP shows high alpha-1 globulin and gammaglobulin, low albumin will defer to nephrology to interpret, he has no headache or visual problems.    He was started on a therapeutic trial of oral prednisone on 02/15/2020 and remarkably feels a whole lot better the next day, continue steroids at present dose, when I tried to taper to a lower dose he felt worse, his CRP has trended down nicely on prednisone on which she will be discharged with outpatient rheumatology and nephrology follow-up.  Question if he has underlying PMR or nonspecific vasculitis.  Addendum.  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  Dr. Otelia Santee will arrange for outpatient follow-up.   2.  Deconditioning.  PT OT, require SNF placement.  MRI brain nonacute but does show atrophy.  3.  Hyponatremia with fluid overload and edema.  Likely due to third spacing of fluids due to severe protein calorie malnutrition, recent echo noted with preserved EF and mild diastolic dysfunction, no pulmonary edema hence I do not think this is CHF. Low-dose oral Lasix to continue if renal function can tolerate.  Protein supplementations being provided.  4.  Severe PCM causing third spacing of fluid and edema.  Has been placed on protein supplements, LFTs appear stable with stable bilirubin, UA has mild proteinuria.  Outpatient nephrology follow-up would suffice.  5.  AKI.  This is likely due to contrast nephropathy he received IV contrast for his CT abdomen and pelvis, He has increased light chains, was seen by nephrology and placed on low-dose Lasix which he is tolerating well, renal function is stable although creatinine is higher than baseline, request SNF MD to monitor BMP closely and arrange for outpatient nephrology follow-up, he is a poor dialysis candidate.  Addendum.  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  Dr. Otelia Santee will arrange for outpatient follow-up.   6.  Dyslipidemia continue home dose statin.  7.  HX of pneumonia with lung scarring.  CT noncontrast stable see #1 above.  8.  Diverticulosis.  Stable.  Nonacute CT abdomen pelvis.  See #1 above and monitor.  No pain or diarrhea.     Discharge diagnosis     Principal Problem:   Failure to thrive in adult Active Problems:   HTN (hypertension)   Hyperlipidemia   Symptomatic anemia   Hyponatremia   Hypoalbuminemia due to protein-calorie malnutrition (HCC)   Leukocytosis   Tachypnea    Physical deconditioning    Discharge instructions    Discharge Instructions    Diet - low sodium heart healthy   Complete by: As directed    Discharge instructions   Complete by: As directed    Follow with Primary MD Redmond School, MD in 7 days   Get CBC, CMP, checked next visit within 1 week by Primary MD or SNF MD    Activity: As tolerated with Full fall precautions use walker/cane & assistance as needed  Disposition SNF  Diet: Heart Healthy    Special Instructions: If you have smoked or chewed Tobacco  in the last 2 yrs  please stop smoking, stop any regular Alcohol  and or any Recreational drug use.  On your next visit with your primary care physician please Get Medicines reviewed and adjusted.  Please request your Prim.MD to go over all Hospital Tests and Procedure/Radiological results at the follow up, please get all Hospita   Increase activity slowly   Complete by: As directed       Discharge Medications   Allergies as of 02/19/2020   No Known Allergies     Medication List    TAKE these medications   aspirin 81 MG tablet Take 81 mg by mouth daily.   atorvastatin 20 MG tablet Commonly known as: LIPITOR 1 tablet daily.   azithromycin 250 MG tablet Commonly known as: ZITHROMAX Take 1 tablet (250 mg total) by mouth daily for 3 days.   benzonatate 100 MG capsule Commonly known as: TESSALON Take 100 mg by mouth 4 (four) times daily as needed for cough.   clotrimazole-betamethasone cream Commonly known as: LOTRISONE Apply 1 application topically See admin instructions. Apply cream to the affected and surround area(s) of skin twice daily in the morning and evening.   cyclophosphamide 50 MG capsule Commonly known as: CYTOXAN Take 2 capsules (100 mg total) by mouth daily. Take with food to minimize GI upset. Take early in the day and maintain hydration.   docusate sodium 100 MG capsule Commonly known as: Colace Take 2 capsules (200 mg total) by mouth 2  (two) times daily. What changed: how much to take   feeding supplement (ENSURE ENLIVE) Liqd Take 237 mLs by mouth 3 (three) times daily between meals.   ferrous sulfate 325 (65 FE) MG tablet Take 1 tablet (325 mg total) by mouth 2 (two) times daily with a meal.   fish oil-omega-3 fatty acids 1000 MG capsule Take 1 g by mouth daily.   folic acid 1 MG tablet Commonly known as: FOLVITE Take 1 mg by mouth daily.   furosemide 40 MG tablet Commonly known as: LASIX Take 1 tablet (40 mg total) by mouth daily.   metoprolol tartrate 25 MG tablet Commonly known as: LOPRESSOR Take 0.5 tablets (12.5 mg total) by mouth 2 (two) times daily.   metroNIDAZOLE 500 MG tablet Commonly known as: FLAGYL Take 1 tablet (500 mg total) by mouth every 8 (eight) hours for 3 days.   niacin 500 MG tablet Take 500 mg by mouth daily with breakfast.   ondansetron 4 MG tablet Commonly known as: ZOFRAN Take 4 mg by mouth every 8 (eight) hours as needed for nausea/vomiting.   polyethylene glycol 17 g packet Commonly known as: MiraLax Take 17 g by mouth daily.   potassium chloride 10 MEQ tablet Commonly known as: KLOR-CON Take 1 tablet (10 mEq total) by mouth daily.   predniSONE 50 MG tablet Commonly known as: DELTASONE Take 1 tablet (50 mg total) by mouth daily with breakfast. Start taking on: Feb 20, 2020        Contact information for follow-up providers    Redmond School, MD. Schedule an appointment as soon as possible for a visit in 1 week(s).   Specialty: Internal Medicine Contact information: [address] [place] [address] [phone]        Bo Merino, MD. Schedule an appointment as soon as possible for a visit in 1 week(s).   Specialty: Rheumatology Why: vasculitis workup Contact information: [address] [place] [address] [phone]        Corliss Parish, MD. Schedule an  appointment as soon as possible for a visit in 1  week(s).   Specialty: Nephrology Why: AKI, proteinuria  Contact information: [place] [address] [phone]            Contact information for after-discharge care    [facility] .   Service: Skilled Nursing Contact information: [address] [place] [phone]                  Major procedures and Radiology Reports - PLEASE review detailed and final reports thoroughly  -        DG Chest 2 View  Result Date: 02/11/2020 CLINICAL DATA:  Shortness of breath EXAM: CHEST - 2 VIEW COMPARISON:  January 23, 2020 FINDINGS: There is either scarring in the lateral right base or small right pleural effusion. There is apparent scarring in the right mid lung as well. Lungs elsewhere are clear. Heart size and pulmonary vascularity are normal. There is aortic atherosclerosis. No adenopathy. There is degenerative change in the thoracic spine. IMPRESSION: Scarring right mid lung. Question scarring lateral right base versus small right pleural effusion. Lungs elsewhere clear. Stable cardiac silhouette.  Aortic Atherosclerosis (ICD10-I70.0). Electronically Signed   By: Lowella Grip III M.D.   On: 02/11/2020 11:40   CT CHEST WO CONTRAST  Result Date: 02/12/2020 CLINICAL DATA:  Cancer of unknown primary. EXAM: CT CHEST WITHOUT CONTRAST TECHNIQUE: Multidetector CT imaging of the chest was performed following the standard protocol without IV contrast. COMPARISON:  January 23, 2020. FINDINGS: Cardiovascular: Atherosclerosis of thoracic aorta is noted without aneurysm formation. Normal cardiac size. No pericardial effusion. Coronary artery calcifications are noted. Mediastinum/Nodes: No enlarged mediastinal or axillary lymph nodes. Thyroid gland, trachea, and esophagus demonstrate no significant findings. Lungs/Pleura: No pneumothorax is noted. Minimal right pleural effusion is noted. Minimal bilateral posterior  basilar subsegmental atelectasis is noted. Stable left apical scarring is noted. Stable right middle lobe scarring is noted. Stable right posterior upper lobe opacity is noted concerning for pneumonia or possibly scarring. Upper Abdomen: No acute abnormality. Musculoskeletal: No chest wall mass or suspicious bone lesions identified. IMPRESSION: 1. Coronary artery calcifications are noted suggesting coronary artery disease. 2. Minimal right pleural effusion is noted. 3. Stable right posterior upper lobe opacity is noted concerning for pneumonia or possibly scarring. Stable right middle lobe scarring is noted. 4. Stable left apical scarring is noted. 5. Minimal bilateral posterior basilar subsegmental atelectasis is noted. Aortic Atherosclerosis (ICD10-I70.0). Electronically Signed   By: Marijo Conception M.D.   On: 02/12/2020 11:46   CT Chest W Contrast  Result Date: 01/23/2020 CLINICAL DATA:  85 year old male with generalized weakness. EXAM: CT CHEST WITH CONTRAST TECHNIQUE: Multidetector CT imaging of the chest was performed during intravenous contrast administration. CONTRAST:  80m OMNIPAQUE IOHEXOL 300 MG/ML  SOLN COMPARISON:  Chest radiograph dated 01/23/2020. Chest CT dated 12/29/2019 FINDINGS: Cardiovascular: There is no cardiomegaly or pericardial effusion. Coronary vascular calcification primarily involving the LAD and left circumflex artery. There is moderate atherosclerotic calcification of the thoracic aorta. No aneurysmal dilatation or dissection. Evaluation of the pulmonary arteries is limited due to suboptimal opacification. No definite pulmonary artery embolus identified. Mediastinum/Nodes: There is no hilar or mediastinal adenopathy. Right hilar lymph node measures 7 mm in short axis. The esophagus is grossly unremarkable. No mediastinal fluid collection. Lungs/Pleura: There is a small right pleural effusion, decreased in size since the prior CT. Right upper lobe streaky and nodular  densities most  consistent with pneumonia. Overall improvement of the right upper lobe densities since the prior CT. Clinical correlation and continued follow-up recommended. Additional nodular densities in the left apex and left lower lobe. There is no pneumothorax. The central airways are patent. There is mild bronchial wall thickening most consistent with bronchitis. Biapical scarring noted. Upper Abdomen: Small gallstones. Musculoskeletal: Degenerative changes of the spine. No acute osseous pathology. IMPRESSION: 1. Right upper lobe pneumonia, improved since the prior CT. Clinical correlation and continued follow-up recommended. 2. Small right pleural effusion, decreased in size since the prior CT. 3. Mild bronchial wall thickening most consistent with bronchitis. 4. Cholelithiasis. 5. Aortic Atherosclerosis (ICD10-I70.0). Electronically Signed   By: Anner Crete M.D.   On: 01/23/2020 17:23   MR BRAIN WO CONTRAST  Result Date: 02/11/2020 CLINICAL DATA:  Subacute neuro deficit EXAM: MRI HEAD WITHOUT CONTRAST TECHNIQUE: Multiplanar, multiecho pulse sequences of the brain and surrounding structures were obtained without intravenous contrast. COMPARISON:  02/17/2009 FINDINGS: Brain: No acute infarction, hemorrhage, hydrocephalus, extra-axial collection or mass lesion. Cerebral volume loss and ventriculomegaly that is progressed from 2010. No significant ischemic changes. Vascular: Normal flow voids Skull and upper cervical spine: Normal marrow signal Sinuses/Orbits: Marked atrophy of the right optic nerve compared to the left, see coronal reformats. There is history of blindness on the right per the chart. Bilateral mastoid opacification. Left sphenoid sinus opacification. IMPRESSION: 1. No acute or reversible finding. 2. Brain atrophy that has progressed from 2010. 3. Left sphenoid sinus and bilateral mastoid opacification with negative nasopharynx. Electronically Signed   By: Monte Fantasia M.D.   On: 02/11/2020 19:17    CT Abdomen Pelvis W Contrast  Result Date: 02/11/2020 CLINICAL DATA:  Nausea and vomiting. Weakness. Recent pneumonia EXAM: CT ABDOMEN AND PELVIS WITH CONTRAST TECHNIQUE: Multidetector CT imaging of the abdomen and pelvis was performed using the standard protocol following bolus administration of intravenous contrast. CONTRAST:  165m OMNIPAQUE IOHEXOL 300 MG/ML  SOLN COMPARISON:  None. FINDINGS: Lower Chest: No acute findings. Tiny pleural effusions noted bilaterally. Hepatobiliary: No hepatic masses identified. A few tiny sub-cm calcified gallstones are seen, however there is no evidence of cholecystitis or biliary dilatation. Pancreas:  No mass or inflammatory changes. Spleen: Within normal limits in size and appearance. Adrenals/Urinary Tract: No masses identified. No evidence of ureteral calculi or hydronephrosis. Stomach/Bowel: No evidence of obstruction, inflammatory process or abnormal fluid collections. Normal appendix visualized. Diverticulosis is seen mainly involving the sigmoid colon, however there is no evidence of diverticulitis. Vascular/Lymphatic: No pathologically enlarged lymph nodes. No abdominal aortic aneurysm. Aortic atherosclerosis noted. Reproductive:  No mass or other significant abnormality. Other:  None. Musculoskeletal:  No suspicious bone lesions identified. IMPRESSION: 1. Colonic diverticulosis, without radiographic evidence of diverticulitis or other acute findings. 2. Cholelithiasis. No radiographic evidence of cholecystitis. 3. Tiny bilateral pleural effusions. Aortic Atherosclerosis (ICD10-I70.0). Electronically Signed   By: JMarlaine HindM.D.   On: 02/11/2020 14:08   DG Chest Portable 1 View  Result Date: 01/23/2020 CLINICAL DATA:  Progressive weakness. Recent history of pneumonia. EXAM: PORTABLE CHEST 1 VIEW COMPARISON:  Chest x-ray dated 01/19/2020 and chest CT dated 12/29/2019 FINDINGS: The heart size and pulmonary vascularity are normal. Aortic atherosclerosis. Chronic  blunting of the right costophrenic angle laterally. Chronic slight parenchymal scarring in the right midzone. Left lung is clear. No acute bone abnormality. Moderate arthritic changes of both glenohumeral joints. IMPRESSION: 1. No acute abnormalities. 2.  Aortic Atherosclerosis (ICD10-I70.0). Electronically Signed   By: JJeneen Rinks  Maxwell M.D.   On: 01/23/2020 13:45    Micro Results    Recent Results (from the past 240 hour(s))  Respiratory Panel by RT PCR (Flu A&B, Covid) - Nasopharyngeal Swab     Status: None   Collection Time: 02/11/20  4:01 PM   Specimen: Nasopharyngeal Swab  Result Value Ref Range Status   SARS Coronavirus 2 by RT PCR NEGATIVE NEGATIVE Final    Comment: (NOTE) SARS-CoV-2 target nucleic acids are NOT DETECTED. The SARS-CoV-2 RNA is generally detectable in upper respiratoy specimens during the acute phase of infection. The lowest concentration of SARS-CoV-2 viral copies this assay can detect is 131 copies/mL. A negative result does not preclude SARS-Cov-2 infection and should not be used as the sole basis for treatment or other patient management decisions. A negative result may occur with  improper specimen collection/handling, submission of specimen other than nasopharyngeal swab, presence of viral mutation(s) within the areas targeted by this assay, and inadequate number of viral copies (<131 copies/mL). A negative result must be combined with clinical observations, patient history, and epidemiological information. The expected result is Negative. Fact Sheet for Patients:  PinkCheek.be Fact Sheet for Healthcare Providers:  GravelBags.it This test is not yet ap proved or cleared by the [place] FDA and  has been authorized for detection and/or diagnosis of SARS-CoV-2 by FDA under an Emergency Use Authorization (EUA). This EUA will remain  in effect (meaning this test can be used) for the duration of  the COVID-19 declaration under Section 564(b)(1) of the Act, 21 U.S.C. section 360bbb-3(b)(1), unless the authorization is terminated or revoked sooner.    Influenza A by PCR NEGATIVE NEGATIVE Final   Influenza B by PCR NEGATIVE NEGATIVE Final    Comment: (NOTE) The Xpert Xpress SARS-CoV-2/FLU/RSV assay is intended as an aid in  the diagnosis of influenza from Nasopharyngeal swab specimens and  should not be used as a sole basis for treatment. Nasal washings and  aspirates are unacceptable for Xpert Xpress SARS-CoV-2/FLU/RSV  testing. Fact Sheet for Patients: PinkCheek.be Fact Sheet for Healthcare Providers: GravelBags.it This test is not yet approved or cleared by the [place] FDA and  has been authorized for detection and/or diagnosis of SARS-CoV-2 by  FDA under an Emergency Use Authorization (EUA). This EUA will remain  in effect (meaning this test can be used) for the duration of the  Covid-19 declaration under Section 564(b)(1) of the Act, 21  U.S.C. section 360bbb-3(b)(1), unless the authorization is  terminated or revoked. Performed at [facility], [place] [address], [place], [place] [address]   Culture, blood (routine x 2)     Status: None   Collection Time: 02/11/20  9:26 PM   Specimen: BLOOD  Result Value Ref Range Status   Specimen Description BLOOD LEFT ANTECUBITAL  Final   Special Requests   Final    BOTTLES DRAWN AEROBIC AND ANAEROBIC Blood Culture adequate volume   Culture   Final    NO GROWTH 5 DAYS Performed at [facility], [place] [address] [place], [place] [address]    Report Status 02/16/2020 FINAL  Final  Culture, blood (routine x 2)     Status: None   Collection Time: 02/11/20  9:53 PM   Specimen: BLOOD RIGHT HAND  Result Value Ref Range Status   Specimen Description BLOOD RIGHT HAND  Final   Special Requests   Final    BOTTLES DRAWN AEROBIC ONLY Blood Culture adequate volume    Culture  Final    NO GROWTH 5 DAYS Performed at [facility], [place] [address], [place], [place] [address]    Report Status 02/16/2020 FINAL  Final  SARS CORONAVIRUS 2 (TAT 6-24 HRS) Nasopharyngeal Nasopharyngeal Swab     Status: None   Collection Time: 02/18/20  7:52 AM   Specimen: Nasopharyngeal Swab  Result Value Ref Range Status   SARS Coronavirus 2 NEGATIVE NEGATIVE Final    Comment: (NOTE) SARS-CoV-2 target nucleic acids are NOT DETECTED. The SARS-CoV-2 RNA is generally detectable in upper and lower respiratory specimens during the acute phase of infection. Negative results do not preclude SARS-CoV-2 infection, do not rule out co-infections with other pathogens, and should not be used as the sole basis for treatment or other patient management decisions. Negative results must be combined with clinical observations, patient history, and epidemiological information. The expected result is Negative. Fact Sheet for Patients: SugarRoll.be Fact Sheet for Healthcare Providers: https://www.woods-mathews.com/ This test is not yet approved or cleared by the [place] FDA and  has been authorized for detection and/or diagnosis of SARS-CoV-2 by FDA under an Emergency Use Authorization (EUA). This EUA will remain  in effect (meaning this test can be used) for the duration of the COVID-19 declaration under Section 56 4(b)(1) of the Act, 21 U.S.C. section 360bbb-3(b)(1), unless the authorization is terminated or revoked sooner. Performed at [facility], [place] [address] [place], [place] [address]     Today   Subjective    Kin Galbraith today has no headache,no chest abdominal pain,no new weakness tingling or numbness, feels much better    Objective   Blood pressure 135/71, pulse 83, temperature 97.6 F (36.4 C), temperature source Oral, resp. rate 18, height 6' (1.829 m), weight 93.1 kg, SpO2 100 %.   Intake/Output Summary  (Last 24 hours) at 02/19/2020 1416 Last data filed at 02/19/2020 1304 Gross per 24 hour  Intake 400 ml  Output 2625 ml  Net -2225 ml    Exam  Awake Alert, No new F.N deficits, Normal affect [place].AT,PERRAL Supple Neck,No JVD, No cervical lymphadenopathy appriciated.  Symmetrical Chest wall movement, Good air movement bilaterally, CTAB RRR,No Gallops,Rubs or new Murmurs, No Parasternal Heave +ve B.Sounds, Abd Soft, Non tender, No organomegaly appriciated, No rebound -guarding or rigidity. No Cyanosis, Clubbing or edema, No new Rash or bruise   Data Review   CBC w Diff:  Lab Results  Component Value Date   WBC 24.0 (H) 02/19/2020   HGB 8.8 (L) 02/19/2020   HCT 28.3 (L) 02/19/2020   PLT 485 (H) 02/19/2020   LYMPHOPCT 15 02/19/2020   MONOPCT 7 02/19/2020   EOSPCT 0 02/19/2020   BASOPCT 0 02/19/2020    CMP:  Lab Results  Component Value Date   NA 132 (L) 02/19/2020   K 4.1 02/19/2020   CL 96 (L) 02/19/2020   CO2 29 02/19/2020   BUN 63 (H) 02/19/2020   CREATININE 1.47 (H) 02/19/2020   PROT 5.8 (L) 02/16/2020   ALBUMIN 1.2 (L) 02/16/2020   BILITOT 0.5 02/16/2020   ALKPHOS 122 02/16/2020   AST 26 02/16/2020   ALT 27 02/16/2020  .   Total Time in preparing paper work, data evaluation and todays exam - 73 minutes  Lala Lund M.D on 02/19/2020 at 2:16 PM  [facility]   Office  [phone]

## 2020-02-19 NOTE — TOC Transition Note (Signed)
Transition of Care (TOC) - CM/SW Discharge Note *Discharged to [facility] via non-emergency transport *Room 134 *Number for Report: [phone]   Patient Details  Name: Jeffrey Ho MRN: [id number] Date of Birth: 1935/08/18  Transition of Care [facility]) CM/SW Contact:  Cristobal Goldmann, LCSW Phone Number: 02/19/2020, 12:57 PM   Clinical Narrative: Patient medically stable for discharge and insurance authorization received. Facility admissions SW Tammy contacted regarding discharge and discharge clinicals transmitted to facility. Visited patient's room and informed Mr. Oregel and wife of discharge and ambulance transport.     Final next level of care: Skilled Nursing Facility Barriers to Discharge: Barriers Resolved   Patient Goals and CMS Choice Patient states their goals for this hospitalization and ongoing recovery are:: To return home after rehab CMS Medicare.gov Compare Post Acute Care list provided to:: Patient(Talked with patient and wife regardiong SNF and [email or web address] Choice offered to / list presented to : Patient, Spouse  Discharge Placement PASRR number recieved: 02/13/20            Patient chooses bed at: [facility] Patient to be transferred to facility by: Non-emergency transport Name of family member notified: Wife at the bedside Patient and family notified of of transfer: 02/19/20  Discharge Plan and Services                                     Social Determinants of Health (SDOH) Interventions  No SDOH interventions requested or needed at discharge.   Readmission Risk Interventions No flowsheet data found.

## 2020-02-19 NOTE — Progress Notes (Signed)
[facility] Progress Note     Assessment/ Plan:   1. Hyponatremia:Is hypervolemic hyponatremia- improved a little this AM 2. HUT:MLYYTKPT creatinine 0.99, elevated to the 1.4's.  Initially felt after contrasted study ;inflammatory markers are elevated.  Labs have been ordered to evaluate for vasculitis-  ANCA, rheumatoid factor, ANA neg, PR3 neg, myeloperoxidase + (40), C3, and C4 are WNL - protein/creatinine ratio 1.5- not nephrotic, UPEP, SPEP neg  Kappa/Lambda light chains are incr K > lambda   Pt will to follow up with Dr. Kathrene Bongo in 2 weeks with labs in 1 week.  Start on CTX orally 100mg  daily (renally dosed) INcrease Prednisone to 60mg  daily  3. Hypoalbuminemia: Patient's albumin this admission 1.1, however low albumin precedes protein in UA and his proteinuria is not nephrotic 4. Lower extremity swelling: New onset in February 2021, patient's recent echo in April 2021negativefor systolic dysfunction, BNP 149. Edema stable at 1-2+ bilaterally. Have started lasix PO 40 BID 5. Hypertension: controlled on low dose hydralazine 6. Anemia: Low and falling. Invasive eval for source of GIB negative.  His iron sat is low but ferritin 1800.  Heme- onc involved 7. Recent community-acquired pneumonialeukocytosis:Patient with persistently elevated leukocytosis at 21K+. Patient started on Steroids prednisone 50mg  daily today 02/15/2020. Elevated procalcitonin 0.7, patient continues to take antibiotics metronidazole and azithromycin. 8. Overall FTT- had been drastic over last 3 months.   Unifying diagnosis still missing, many labs pending-  Currently on a trial of high dose steroids to see if clinical improvement - so far so good  Subjective:   Spouse bedside; doing better today. Eating much better.   Objective:   BP 135/71 (BP Location: Left Arm)   Pulse 83   Temp 97.6 F (36.4 C) (Oral)   Resp 18   Ht 6' (1.829 m)   Wt 93.1 kg   SpO2 100%    BMI 27.84 kg/m   Intake/Output Summary (Last 24 hours) at 02/19/2020 1335 Last data filed at 02/19/2020 1304 Gross per 24 hour  Intake 400 ml  Output 2625 ml  Net -2225 ml   Weight change: -4.1 kg  Physical Exam: General: supine in bed Heart: RRR Lungs: mostly clear Abdomen: soft, non tender Extremities: doughy edema throughout LE's   Imaging: No results found.  Labs: BMET Recent Labs  Lab 02/13/20 0317 02/14/20 0617 02/15/20 0540 02/16/20 0548 02/17/20 0353 02/18/20 0420 02/19/20 0428  NA 128* 127* 128* 129* 132* 133* 132*  K 3.6 4.0 3.6 3.8 3.4* 3.7 4.1  CL 94* 93* 93* 94* 96* 95* 96*  CO2 23 24 25 27 26 28 29   GLUCOSE 114* 125* 115* 145* 174* 131* 137*  BUN 23 26* 35* 51* 61* 59* 63*  CREATININE 1.28* 1.47* 1.42* 1.49* 1.40* 1.44* 1.47*  CALCIUM 7.5* 7.5* 7.4* 7.7* 7.7* 7.9* 7.9*   CBC Recent Labs  Lab 02/16/20 0548 02/17/20 0353 02/18/20 0420 02/19/20 0428  WBC 21.2* 20.3* 24.1* 24.0*  NEUTROABS 16.9* 15.7* 18.0* 17.8*  HGB 7.9* 8.2* 9.9* 8.8*  HCT 25.9* 26.4* 32.2* 28.3*  MCV 86.0 86.3 86.1 85.8  PLT 399 474* 612* 485*    Medications:    . sodium chloride   Intravenous Once  . atorvastatin  20 mg Oral Daily  . docusate sodium  200 mg Oral BID  . enoxaparin (LOVENOX) injection  40 mg Subcutaneous Q24H  . feeding supplement (ENSURE ENLIVE)  237 mL Oral TID BM  . feeding supplement (PRO-STAT SUGAR FREE 64)  30 mL Oral  TID WC  . ferrous sulfate  325 mg Oral BID WC  . folic acid  1 mg Oral Daily  . furosemide  40 mg Oral BID  . metroNIDAZOLE  500 mg Oral Q8H  . pantoprazole  40 mg Oral Daily  . potassium chloride  40 mEq Oral Daily  . predniSONE  30 mg Oral Q breakfast      Otelia Santee, MD 02/19/2020, 1:35 PM

## 2020-02-19 NOTE — Progress Notes (Signed)
Given report to nurse Renae at [facility] and answered all her questions.

## 2020-02-19 NOTE — Progress Notes (Signed)
Jeffrey Ho to be discharged Skilled nursing facility [facility] per MD order. Patient and wife at bedside verbalized understanding.  Skin clean, dry and intact without evidence of skin break down, no evidence of skin tears noted. IV catheter discontinued intact. Site without signs and symptoms of complications. Dressing and pressure applied. Pt denies pain at the site currently. No complaints noted.  Patient free of lines, drains, and wounds.   Discharge packet assembled. An After Visit Summary (AVS) was printed and given to the EMS personnel. Patient escorted via stretcher and discharged to [facility] via ambulance. Report called to accepting facility; all questions and concerns addressed.   Arvilla Meres, RN

## 2020-02-20 ENCOUNTER — Encounter: Payer: Self-pay | Admitting: Adult Health

## 2020-02-20 ENCOUNTER — Non-Acute Institutional Stay (SKILLED_NURSING_FACILITY): Payer: Medicare Other | Admitting: Adult Health

## 2020-02-20 DIAGNOSIS — R600 Localized edema: Secondary | ICD-10-CM

## 2020-02-20 DIAGNOSIS — M353 Polymyalgia rheumatica: Secondary | ICD-10-CM

## 2020-02-20 DIAGNOSIS — E785 Hyperlipidemia, unspecified: Secondary | ICD-10-CM | POA: Diagnosis not present

## 2020-02-20 DIAGNOSIS — I10 Essential (primary) hypertension: Secondary | ICD-10-CM

## 2020-02-20 DIAGNOSIS — E46 Unspecified protein-calorie malnutrition: Secondary | ICD-10-CM

## 2020-02-20 DIAGNOSIS — D649 Anemia, unspecified: Secondary | ICD-10-CM

## 2020-02-20 DIAGNOSIS — R627 Adult failure to thrive: Secondary | ICD-10-CM | POA: Diagnosis not present

## 2020-02-20 DIAGNOSIS — E8809 Other disorders of plasma-protein metabolism, not elsewhere classified: Secondary | ICD-10-CM

## 2020-02-20 DIAGNOSIS — R5381 Other malaise: Secondary | ICD-10-CM

## 2020-02-20 DIAGNOSIS — I779 Disorder of arteries and arterioles, unspecified: Secondary | ICD-10-CM | POA: Diagnosis not present

## 2020-02-20 DIAGNOSIS — D72829 Elevated white blood cell count, unspecified: Secondary | ICD-10-CM

## 2020-02-20 DIAGNOSIS — K5909 Other constipation: Secondary | ICD-10-CM

## 2020-02-20 NOTE — Progress Notes (Signed)
Location:    [facility] Room Number: 134/P Place of Service:  SNF (31)   CODE STATUS: DNR  No Known Allergies  Chief Complaint  Patient presents with  . Hospitalization Follow-up    Hospitalization Follow Up    HPI: He is a 85 year old man who has been hospitalized from 02-11-20; through 02-19-20. In March he was hospitalized for pneumonia with demand ischemia. He was then hospitalized in April for anemia was found to have erosive esophagitis. He has been hospitalized for anemia  failure to thrive; excessive persistent fatigue and deconditioning. He has high inflammatory markers including crp esr leukocytosis. It was felt that he has questionable PMR He was started on prednisone on 02-15-20. He was treated for hyponatremia and fluid overload. He is here for short term rehab with his goal to return back home. He states that he has less fatigue today; no pain; no constipation; no heart burn. He will continue to be followed for his chronic illnesses including: hypertension; hyperlipidemia; malnutrition     Past Medical History:  Diagnosis Date  . Blindness of right eye   . Carotid artery occlusion   . HOH (hard of hearing)   . Hyperlipidemia   . Hypertension     Past Surgical History:  Procedure Laterality Date  . BIOPSY  01/26/2020   Procedure: BIOPSY;  Surgeon: Daneil Dolin, MD;  [facility]: [facility];  Service: Endoscopy;;  gastric biopsies for H. Pylori  . CAROTID ENDARTERECTOMY     left CEA  06/24.2010  . CAROTID ENDARTERECTOMY     right CEA  02/20/2009  . COLONOSCOPY WITH PROPOFOL N/A 01/26/2020   Procedure: COLONOSCOPY WITH PROPOFOL;  Surgeon: Daneil Dolin, MD;  Location: [facility];  Service: Endoscopy;  Laterality: N/A;  . ESOPHAGOGASTRODUODENOSCOPY (EGD) WITH PROPOFOL N/A 01/26/2020   Procedure: ESOPHAGOGASTRODUODENOSCOPY (EGD) WITH PROPOFOL;  Surgeon: Daneil Dolin, MD;  Location: [facility];  Service: Endoscopy;  Laterality: N/A;  .  EYE SURGERY Left Nov. 2015   Cataract  . POLYPECTOMY  01/26/2020   Procedure: POLYPECTOMY;  Surgeon: Daneil Dolin, MD;  Location: [facility];  Service: Endoscopy;;  Splenic Flexure polyp     Social History   Socioeconomic History  . Marital status: Married    Spouse name: Not on file  . Number of children: Not on file  . Years of education: Not on file  . Highest education level: Not on file  Occupational History  . Not on file  Tobacco Use  . Smoking status: Former Smoker    Quit date: 08/10/1969    Years since quitting: 50.5  . Smokeless tobacco: Never Used  Substance and Sexual Activity  . Alcohol use: No  . Drug use: No  . Sexual activity: Not on file  Other Topics Concern  . Not on file  Social History Narrative  . Not on file   Social Determinants of Health   Financial Resource Strain:   . Difficulty of Paying Living Expenses:   Food Insecurity:   . Worried About Charity fundraiser in the Last Year:   . Arboriculturist in the Last Year:   Transportation Needs:   . Film/video editor (Medical):   Marland Kitchen Lack of Transportation (Non-Medical):   Physical Activity:   . Days of Exercise per Week:   . Minutes of Exercise per Session:   Stress:   . Feeling of Stress :   Social Connections:   .  Frequency of Communication with Friends and Family:   . Frequency of Social Gatherings with Friends and Family:   . Attends Religious Services:   . Active Member of Clubs or Organizations:   . Attends Archivist Meetings:   Marland Kitchen Marital Status:   Intimate Partner Violence:   . Fear of Current or Ex-Partner:   . Emotionally Abused:   Marland Kitchen Physically Abused:   . Sexually Abused:    Family History  Problem Relation Age of Onset  . Heart disease Father        Before age 53      VITAL SIGNS BP 105/72   Pulse 83   Temp 98.1 F (36.7 C) (Oral)   Resp 20   Ht 6' (1.829 m)   Wt 210 lb 9.6 oz (95.5 kg)   BMI 28.56 kg/m   Outpatient Encounter Medications  as of 02/20/2020  Medication Sig  . aspirin 81 MG tablet Take 81 mg by mouth daily.  Marland Kitchen atorvastatin (LIPITOR) 20 MG tablet 1 tablet daily.  Marland Kitchen azithromycin (ZITHROMAX) 250 MG tablet Take 1 tablet (250 mg total) by mouth daily for 3 days.  . benzonatate (TESSALON) 100 MG capsule Take 100 mg by mouth 4 (four) times daily as needed for cough.   . clotrimazole-betamethasone (LOTRISONE) cream Apply topically. Apply to skin bid prn fungal erythema of skin folds.  . docusate sodium (COLACE) 100 MG capsule Take 2 capsules (200 mg total) by mouth 2 (two) times daily.  . feeding supplement, ENSURE ENLIVE, (ENSURE ENLIVE) LIQD Take 237 mLs by mouth 3 (three) times daily between meals.  . ferrous sulfate 325 (65 FE) MG tablet Take 1 tablet (325 mg total) by mouth 2 (two) times daily with a meal.  . fish oil-omega-3 fatty acids 1000 MG capsule Take 1 g by mouth daily.   . folic acid (FOLVITE) 1 MG tablet Take 1 mg by mouth daily.  . furosemide (LASIX) 40 MG tablet Take 1 tablet (40 mg total) by mouth daily.  . metoprolol tartrate (LOPRESSOR) 25 MG tablet Take 0.5 tablets (12.5 mg total) by mouth 2 (two) times daily.  . metroNIDAZOLE (FLAGYL) 500 MG tablet Take 1 tablet (500 mg total) by mouth every 8 (eight) hours for 3 days.  . niacin 500 MG tablet Take 500 mg by mouth daily with breakfast.  . NON FORMULARY Diet: _____ Regular, ___x___ NAS, _______Consistent Carbohydrate, _______NPO _____Other  . ondansetron (ZOFRAN) 4 MG tablet Take 4 mg by mouth every 8 (eight) hours as needed for nausea/vomiting.  . polyethylene glycol (MIRALAX) 17 g packet Take 17 g by mouth daily.  . potassium chloride SA (KLOR-CON) 10 MEQ tablet Take 1 tablet (10 mEq total) by mouth daily.  . predniSONE (DELTASONE) 10 MG tablet Take 30 mg by mouth daily with breakfast.  . [DISCONTINUED] cyclophosphamide (CYTOXAN) 50 MG capsule Take 2 capsules (100 mg total) by mouth daily. Take with food to minimize GI upset. Take early in the day  and maintain hydration.  . [DISCONTINUED] predniSONE (DELTASONE) 50 MG tablet Take 1 tablet (50 mg total) by mouth daily with breakfast.   No facility-administered encounter medications on file as of 02/20/2020.     SIGNIFICANT DIAGNOSTIC EXAMS  TODAY  02-11-20: chest x-ray;  Scarring right mid lung. Question scarring lateral right base versus small right pleural effusion. Lungs elsewhere clear. Stable cardiac silhouette.  Aortic Atherosclerosis   02-11-20: ct of abdomen and pelvis:  1. Colonic diverticulosis, without radiographic evidence of diverticulitis or  other acute findings. 2. Cholelithiasis. No radiographic evidence of cholecystitis. 3. Tiny bilateral pleural effusions. Aortic Atherosclerosis   02-11-20: MRI of brain:  1. No acute or reversible finding. 2. Brain atrophy that has progressed from 2010. 3. Left sphenoid sinus and bilateral mastoid opacification with negative nasopharynx.  02-12-20: ct of chest:  1. Coronary artery calcifications are noted suggesting coronary artery disease. 2. Minimal right pleural effusion is noted. 3. Stable right posterior upper lobe opacity is noted concerning for pneumonia or possibly scarring. Stable right middle lobe scarring is noted. 4. Stable left apical scarring is noted. 5. Minimal bilateral posterior basilar subsegmental atelectasis is noted. Aortic Atherosclerosis   LABS REVIEWED TODAY  02-11-20: wbc 18.5; hgb 8.0; hct 26.6; mcv 88.4 plt 421; glucose 108; bun 20; creat 1.23; k+ 4.2; na++ 129; ca 7.9; alk phos 190; albumin 1.4 vit B 12: 436 folate 14.2; iron 18 tibc 133; ferritin 1812 blood culture: no growth 02-12-20: uric acid 3.8 02-13-20: wbc 21.1; hgb 7.7; hct 24.8; mcv 85.5 plt 422; glucose 114; bun 23; creat 1.28; k+ 3.6; na++ 128; ca 7.5 alk phos 184; albumin 1.2 PSA 0.08 02-15-20: CK 11; ANCA titer 1: 160; RA 27.0 ANA: neg; sed rate 110 02-18-20: wbc 24.1; hgb 9.9; hct 32.2; mcv 86.1 plt 612; glucose 131; bun 59; creat 1.44; k+  3.7; na++ 133; ca 7.9  02-19-20: wbc 24.0; hgb 8.8; hct 28,3 mcv 85.8 plt 485; glucose 137; bun 63; creat 1.47; k+ 4.1; na++ 132; ca 7.9    Review of Systems  Constitutional: Negative for malaise/fatigue.  Respiratory: Negative for cough and shortness of breath.   Cardiovascular: Negative for chest pain, palpitations and leg swelling.  Gastrointestinal: Negative for abdominal pain, constipation and heartburn.  Musculoskeletal: Negative for back pain, joint pain and myalgias.  Skin: Negative.   Neurological: Negative for dizziness.  Psychiatric/Behavioral: The patient is not nervous/anxious.     Physical Exam Constitutional:      General: He is not in acute distress.    Appearance: He is well-developed. He is not diaphoretic.  Eyes:     Comments: Right eye blindness  Neck:     Thyroid: No thyromegaly.  Cardiovascular:     Rate and Rhythm: Normal rate and regular rhythm.     Pulses: Normal pulses.     Heart sounds: Murmur present.  Pulmonary:     Effort: Pulmonary effort is normal. No respiratory distress.     Breath sounds: Normal breath sounds.  Abdominal:     General: Bowel sounds are normal. There is no distension.     Palpations: Abdomen is soft.     Tenderness: There is no abdominal tenderness.  Musculoskeletal:        General: Normal range of motion.     Cervical back: Neck supple.     Right lower leg: Edema present.     Left lower leg: Edema present.     Comments: 1+ bilateral lower extremity edema   Lymphadenopathy:     Cervical: No cervical adenopathy.  Skin:    General: Skin is warm and dry.  Neurological:     Mental Status: He is alert and oriented to person, place, and time.  Psychiatric:        Mood and Affect: Mood normal.      ASSESSMENT/ PLAN:  TODAY  1. Carotid artery disease without cerebral infarction: is stable will continue asa 81 mg daily lipitor 20 mg daily   2. Failure to thrive in adult/hypoalbuminemia due  to protein calorie  malnutrition: weight is 210 pounds albumin 1.2 will continue supplements as directed.   3. Hyperlipidemia unspecified hyperlipidemia type: is stable will continue lipitor 20 mg daily fish oil daily and niacin 500 mg daily   4. Physical deconditioning: is stable will continue therapy as directed to improve upon his level of independence with his adls.   5. Leukocytosis unspecified type: is stable will complete zithromax and flagyl has recently been treated for pneumonia; thought to have continued infection   6. Symptomatic anemia: is stable hgb 8.8 will continue iron twice daily  7. Chronic constipation: is stable will continue colace 200 mg twice daily and miralax daily   8. Question underlying PMR (polymyalgia rheumatica): is stable with prednisone 30 mg daily   9. Bilateral lower extremity edema:has third spacing due to extremely low albumin  is stable will continue lasix 40 mg daily with k+ 10 meq daily   10. Essential hypertension: is stable b/p 105/72 will continue lopressor 12.5 mg twice daily          MD is aware of resident's narcotic use and is in agreement with current plan of care. We will attempt to wean resident as appropriate.  Ok Edwards NP [facility]  Contact [phone] Monday through Friday 8am- 5pm  After hours call [phone]

## 2020-02-21 ENCOUNTER — Encounter: Payer: Self-pay | Admitting: Internal Medicine

## 2020-02-21 ENCOUNTER — Non-Acute Institutional Stay (SKILLED_NURSING_FACILITY): Payer: Medicare Other | Admitting: Internal Medicine

## 2020-02-21 DIAGNOSIS — R627 Adult failure to thrive: Secondary | ICD-10-CM

## 2020-02-21 DIAGNOSIS — N179 Acute kidney failure, unspecified: Secondary | ICD-10-CM | POA: Diagnosis not present

## 2020-02-21 NOTE — Assessment & Plan Note (Signed)
Monitor BMET. 

## 2020-02-21 NOTE — Patient Instructions (Signed)
See assessment and plan under each diagnosis in the problem list and acutely for this visit 

## 2020-02-21 NOTE — Progress Notes (Signed)
NURSING HOME LOCATION:  [facility] ROOM NUMBER:  [id number]  CODE STATUS:  DNR  PCP:  Redmond School MD  This is a comprehensive admission note to [facility] performed on this date less than 30 days from date of admission. Included are preadmission medical/surgical history; reconciled medication list; family history; social history and comprehensive review of systems.  Corrections and additions to the records were documented. Comprehensive physical exam was also performed. Additionally a clinical summary was entered for each active diagnosis pertinent to this admission in the Problem List to enhance continuity of care.  HPI: Patient was hospitalized 5/9-5/17/2021, admitted from home with generalized weakness in the context progressive anemia and hyponatremia. This most recent admission actually relates to an admission 3/28-3/30 for CAP with demand ischemia.  He was discharged on Augmentin.  During that admission heme positive stools were noted but hemoglobin was stable and there was no overt bleeding.  Outpatient GI follow-up was to be pursued. He was readmitted 4/20-4/23 with symptomatic anemia and hyponatremia.  EGD/colonoscopy revealed erosive esophagitis and diverticulosis/polyps.  He was subsequently discharged only to return on 5/9 as noted. In addition to the weakness there was felt to be clinical failure to thrive with excessive, persistent fatigue in the context of deconditioning. CT of chest/abdomen/pelvis revealed no definite abnormalities.  His decline was felt to be multifactorial especially impacted by advanced age.   Because of the possibility of any lingering infection he received 7 days of azithromycin and Flagyl.  The antibiotics were to be continued post discharge until 5/20. He did receive 1 unit of packed cells on 5/11; following this H/H appeared to be stable. Inflammatory markers including CRP and sed rate were elevated. In the differential diagnosis was  possible underlying PMR.  Myeloperoxidase antibody & RA factor were also elevated.  Urine protein electrophoresis revealed high kappa and lambda light chains.  Serum protein electrophoresis revealed high alpha-1 globulin and gammaglobulin.   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  Biopsy was not pursued due to his multiple advanced comorbidities.  He also was placed on therapeutic trial of oral prednisone as of 5/13.  This was associated with dramatic subjective improvement.  Attempts to taper to low-dose resulted in exacerbation of the symptoms.  The C-reactive protein did trend down on prednisone.  Outpatient rheumatology follow-up was to be pursued. Hyponatremia was attributed to fluid overload with third spacing due to severe protein/caloric malnutrition.  Protein supplements were initiated.  Sufficiently current echo had revealed preserved EF and mild diastolic dysfunction. AKI was attributed to contrast nephropathy related to the CT scans of the abdomen/pelvis.  Creatinine was slightly higher than his baseline but he was able to tolerate low-dose Lasix prescribed by Nephrology.   Past medical and surgical history: Includes dyslipidemia, history of diverticulosis, blindness of the right eye, and history of carotid artery occlusion. Surgeries include carotid endarterectomy and colon polypectomy.  Social history: Nondrinker; former smoker  Family history: Reviewed   Review of systems: He states that his weakness is dramatically better.  He denies any significant joint symptoms.  His only swelling is peripheral edema. His only concern is persistent constipation.  He took milk of magnesia last night His wife says that he does snore but there is no history of apnea.  Constitutional: No fever, significant weight change Eyes: No redness, discharge, pain, vision change ENT/mouth: No nasal congestion, purulent discharge, earache,  change in hearing, sore throat  Cardiovascular: No chest pain,  palpitations, paroxysmal nocturnal dyspnea, claudication, edema  Respiratory: No cough, sputum production, hemoptysis Gastrointestinal: No heartburn, dysphagia, abdominal pain, nausea /vomiting, rectal bleeding, melena Genitourinary: No dysuria, hematuria, pyuria, incontinence, nocturia Musculoskeletal: No joint stiffness, joint swelling,  pain Dermatologic: No rash, pruritus, change in appearance of skin Neurologic: No dizziness, headache, syncope, seizures, numbness, tingling Psychiatric: No significant anxiety, depression, insomnia, anorexia Endocrine: No change in hair/skin/nails, excessive thirst, excessive hunger, excessive urination  Hematologic/lymphatic: No significant bruising, lymphadenopathy, abnormal bleeding Allergy/immunology: No itchy/watery eyes, significant sneezing, urticaria, angioedema  Physical exam:  Pertinent or positive findings: He has pattern alopecia.  When I entered the room he was lying flat but used the grab bar to pull himself up.   Clinically he was still weak although strength to opposition was fair-good.  He has bilateral hearing aids.  Exotropia is present on the right.  Grade 1/2 systolic murmur is present; slight tachycardia is noted.  There is a suggestion of a cervical rib on the left.  Abdomen is protuberant.  He has 1.5+ pitting edema of the extremities.  He has minor DIP changes of the fingers and scattered distribution.  There is minimal crepitus of the left knee without effusion.  He has a circular scar over the posterior scalp.  General appearance: Adequately nourished; no acute distress, increased work of breathing is present.   Lymphatic: No lymphadenopathy about the head, neck, axilla. Eyes: No conjunctival inflammation or lid edema is present. There is no scleral icterus. Ears:  External ear exam shows no significant lesions or deformities.   Nose:  External nasal examination shows no  deformity or inflammation. Nasal mucosa are pink and moist without lesions, exudates Oral exam: Lips and gums are healthy appearing.There is no oropharyngeal erythema or exudate. Neck:  No thyromegaly, masses, tenderness noted.    Heart:  No click, rub.  Lungs: Chest clear to auscultation without wheezes, rhonchi, rales, rubs. Abdomen: Bowel sounds are normal.  Abdomen is soft and nontender with no organomegaly, hernias, masses. GU: Deferred  Extremities:  No cyanosis, clubbing Neurologic exam:  Balance, Rhomberg, finger to nose testing could not be completed due to clinical state Skin: Warm & dry w/o tenting. No significant  rash.  See clinical summary under each active problem in the Problem List with associated updated therapeutic plan

## 2020-02-22 NOTE — Assessment & Plan Note (Signed)
PT/NF OP Rheumatology evaluation

## 2020-02-23 DIAGNOSIS — K5909 Other constipation: Secondary | ICD-10-CM | POA: Insufficient documentation

## 2020-02-23 DIAGNOSIS — M353 Polymyalgia rheumatica: Secondary | ICD-10-CM | POA: Insufficient documentation

## 2020-02-23 DIAGNOSIS — R600 Localized edema: Secondary | ICD-10-CM | POA: Insufficient documentation

## 2020-02-26 ENCOUNTER — Other Ambulatory Visit (HOSPITAL_COMMUNITY)
Admission: RE | Admit: 2020-02-26 | Discharge: 2020-02-26 | Disposition: A | Discharge status: Home / Self Care | Payer: Medicare Other | Source: Skilled Nursing Facility | Attending: *Deleted | Admitting: *Deleted

## 2020-02-26 DIAGNOSIS — K6289 Other specified diseases of anus and rectum: Secondary | ICD-10-CM | POA: Diagnosis not present

## 2020-02-26 DIAGNOSIS — E876 Hypokalemia: Secondary | ICD-10-CM | POA: Diagnosis not present

## 2020-02-26 LAB — CBC
HCT: 31.6 % — ABNORMAL LOW (ref 39.0–52.0)
Hemoglobin: 9.7 g/dL — ABNORMAL LOW (ref 13.0–17.0)
MCH: 26.4 pg (ref 26.0–34.0)
MCHC: 30.7 g/dL (ref 30.0–36.0)
MCV: 86.1 fL (ref 80.0–100.0)
Platelets: 429 10*3/uL — ABNORMAL HIGH (ref 150–400)
RBC: 3.67 MIL/uL — ABNORMAL LOW (ref 4.22–5.81)
RDW: 16.5 % — ABNORMAL HIGH (ref 11.5–15.5)
WBC: 19.2 10*3/uL — ABNORMAL HIGH (ref 4.0–10.5)
nRBC: 0 % (ref 0.0–0.2)

## 2020-02-27 ENCOUNTER — Other Ambulatory Visit: Payer: Self-pay

## 2020-02-27 ENCOUNTER — Emergency Department (HOSPITAL_COMMUNITY): Payer: Medicare Other

## 2020-02-27 ENCOUNTER — Encounter: Payer: Self-pay | Admitting: Adult Health

## 2020-02-27 ENCOUNTER — Other Ambulatory Visit (HOSPITAL_COMMUNITY)
Admission: RE | Admit: 2020-02-27 | Discharge: 2020-02-27 | Disposition: A | Discharge status: Home / Self Care | Payer: Medicare Other | Source: Skilled Nursing Facility | Attending: Adult Health | Admitting: Adult Health

## 2020-02-27 ENCOUNTER — Encounter (HOSPITAL_COMMUNITY): Payer: Self-pay | Admitting: Emergency Medicine

## 2020-02-27 ENCOUNTER — Inpatient Hospital Stay (HOSPITAL_COMMUNITY)
Admission: EM | Admit: 2020-02-27 | Discharge: 2020-03-01 | DRG: 394 | Disposition: A | Discharge status: Home Health | Payer: Medicare Other | Source: Skilled Nursing Facility | Attending: Internal Medicine | Admitting: Internal Medicine

## 2020-02-27 ENCOUNTER — Non-Acute Institutional Stay (SKILLED_NURSING_FACILITY): Payer: Medicare Other | Admitting: Adult Health

## 2020-02-27 DIAGNOSIS — K529 Noninfective gastroenteritis and colitis, unspecified: Secondary | ICD-10-CM | POA: Diagnosis present

## 2020-02-27 DIAGNOSIS — K5900 Constipation, unspecified: Secondary | ICD-10-CM

## 2020-02-27 DIAGNOSIS — E876 Hypokalemia: Secondary | ICD-10-CM | POA: Diagnosis present

## 2020-02-27 DIAGNOSIS — Z87891 Personal history of nicotine dependence: Secondary | ICD-10-CM

## 2020-02-27 DIAGNOSIS — K5909 Other constipation: Secondary | ICD-10-CM

## 2020-02-27 DIAGNOSIS — Z79899 Other long term (current) drug therapy: Secondary | ICD-10-CM

## 2020-02-27 DIAGNOSIS — Z7952 Long term (current) use of systemic steroids: Secondary | ICD-10-CM

## 2020-02-27 DIAGNOSIS — E785 Hyperlipidemia, unspecified: Secondary | ICD-10-CM | POA: Diagnosis present

## 2020-02-27 DIAGNOSIS — K8020 Calculus of gallbladder without cholecystitis without obstruction: Secondary | ICD-10-CM | POA: Diagnosis present

## 2020-02-27 DIAGNOSIS — Z7982 Long term (current) use of aspirin: Secondary | ICD-10-CM

## 2020-02-27 DIAGNOSIS — R627 Adult failure to thrive: Secondary | ICD-10-CM | POA: Diagnosis present

## 2020-02-27 DIAGNOSIS — D72829 Elevated white blood cell count, unspecified: Secondary | ICD-10-CM | POA: Diagnosis present

## 2020-02-27 DIAGNOSIS — E871 Hypo-osmolality and hyponatremia: Secondary | ICD-10-CM

## 2020-02-27 DIAGNOSIS — Z6827 Body mass index (BMI) 27.0-27.9, adult: Secondary | ICD-10-CM

## 2020-02-27 DIAGNOSIS — Z66 Do not resuscitate: Secondary | ICD-10-CM | POA: Diagnosis present

## 2020-02-27 DIAGNOSIS — K5641 Fecal impaction: Secondary | ICD-10-CM | POA: Diagnosis present

## 2020-02-27 DIAGNOSIS — M353 Polymyalgia rheumatica: Secondary | ICD-10-CM | POA: Diagnosis present

## 2020-02-27 DIAGNOSIS — H5461 Unqualified visual loss, right eye, normal vision left eye: Secondary | ICD-10-CM | POA: Diagnosis present

## 2020-02-27 DIAGNOSIS — Z8249 Family history of ischemic heart disease and other diseases of the circulatory system: Secondary | ICD-10-CM

## 2020-02-27 DIAGNOSIS — I10 Essential (primary) hypertension: Secondary | ICD-10-CM | POA: Diagnosis present

## 2020-02-27 DIAGNOSIS — E43 Unspecified severe protein-calorie malnutrition: Secondary | ICD-10-CM | POA: Insufficient documentation

## 2020-02-27 DIAGNOSIS — K6289 Other specified diseases of anus and rectum: Principal | ICD-10-CM | POA: Diagnosis present

## 2020-02-27 LAB — COMPREHENSIVE METABOLIC PANEL
ALT: 50 U/L — ABNORMAL HIGH (ref 0–44)
AST: 52 U/L — ABNORMAL HIGH (ref 15–41)
Albumin: 2.3 g/dL — ABNORMAL LOW (ref 3.5–5.0)
Alkaline Phosphatase: 119 U/L (ref 38–126)
Anion gap: 11 (ref 5–15)
BUN: 41 mg/dL — ABNORMAL HIGH (ref 8–23)
CO2: 27 mmol/L (ref 22–32)
Calcium: 8 mg/dL — ABNORMAL LOW (ref 8.9–10.3)
Chloride: 89 mmol/L — ABNORMAL LOW (ref 98–111)
Creatinine, Ser: 1.56 mg/dL — ABNORMAL HIGH (ref 0.61–1.24)
GFR calc Af Amer: 47 mL/min — ABNORMAL LOW (ref >60)
GFR calc non Af Amer: 41 mL/min — ABNORMAL LOW (ref >60)
Glucose, Bld: 145 mg/dL — ABNORMAL HIGH (ref 70–99)
Potassium: 3 mmol/L — ABNORMAL LOW (ref 3.5–5.1)
Sodium: 127 mmol/L — ABNORMAL LOW (ref 135–145)
Total Bilirubin: 0.6 mg/dL (ref 0.3–1.2)
Total Protein: 6.8 g/dL (ref 6.5–8.1)

## 2020-02-27 LAB — BASIC METABOLIC PANEL
Anion gap: 13 (ref 5–15)
BUN: 38 mg/dL — ABNORMAL HIGH (ref 8–23)
CO2: 26 mmol/L (ref 22–32)
Calcium: 7.9 mg/dL — ABNORMAL LOW (ref 8.9–10.3)
Chloride: 92 mmol/L — ABNORMAL LOW (ref 98–111)
Creatinine, Ser: 1.44 mg/dL — ABNORMAL HIGH (ref 0.61–1.24)
GFR calc Af Amer: 52 mL/min — ABNORMAL LOW (ref >60)
GFR calc non Af Amer: 45 mL/min — ABNORMAL LOW (ref >60)
Glucose, Bld: 117 mg/dL — ABNORMAL HIGH (ref 70–99)
Potassium: 3 mmol/L — ABNORMAL LOW (ref 3.5–5.1)
Sodium: 131 mmol/L — ABNORMAL LOW (ref 135–145)

## 2020-02-27 LAB — CBC WITH DIFFERENTIAL/PLATELET
Abs Immature Granulocytes: 0.08 10*3/uL — ABNORMAL HIGH (ref 0.00–0.07)
Basophils Absolute: 0 10*3/uL (ref 0.0–0.1)
Basophils Relative: 0 %
Eosinophils Absolute: 0 10*3/uL (ref 0.0–0.5)
Eosinophils Relative: 0 %
HCT: 32.4 % — ABNORMAL LOW (ref 39.0–52.0)
Hemoglobin: 10 g/dL — ABNORMAL LOW (ref 13.0–17.0)
Immature Granulocytes: 1 %
Lymphocytes Relative: 12 %
Lymphs Abs: 1.8 10*3/uL (ref 0.7–4.0)
MCH: 26.8 pg (ref 26.0–34.0)
MCHC: 30.9 g/dL (ref 30.0–36.0)
MCV: 86.9 fL (ref 80.0–100.0)
Monocytes Absolute: 1.1 10*3/uL — ABNORMAL HIGH (ref 0.1–1.0)
Monocytes Relative: 7 %
Neutro Abs: 11.9 10*3/uL — ABNORMAL HIGH (ref 1.7–7.7)
Neutrophils Relative %: 80 %
Platelets: 413 10*3/uL — ABNORMAL HIGH (ref 150–400)
RBC: 3.73 MIL/uL — ABNORMAL LOW (ref 4.22–5.81)
RDW: 16.6 % — ABNORMAL HIGH (ref 11.5–15.5)
WBC: 14.9 10*3/uL — ABNORMAL HIGH (ref 4.0–10.5)
nRBC: 0 % (ref 0.0–0.2)

## 2020-02-27 LAB — MAGNESIUM: Magnesium: 2.6 mg/dL — ABNORMAL HIGH (ref 1.7–2.4)

## 2020-02-27 LAB — LIPASE, BLOOD: Lipase: 19 U/L (ref 11–51)

## 2020-02-27 MED ORDER — FLEET ENEMA 7-19 GM/118ML RE ENEM
1.0000 | ENEMA | Freq: Once | RECTAL | Status: AC
  Administered 2020-02-28: 1 via RECTAL

## 2020-02-27 MED ORDER — SODIUM CHLORIDE 0.9 % IV BOLUS
1000.0000 mL | Freq: Once | INTRAVENOUS | Status: AC
  Administered 2020-02-28: 1000 mL via INTRAVENOUS

## 2020-02-27 MED ORDER — IOHEXOL 300 MG/ML  SOLN
75.0000 mL | Freq: Once | INTRAMUSCULAR | Status: AC | PRN
  Administered 2020-02-27: 75 mL via INTRAVENOUS

## 2020-02-27 MED ORDER — PIPERACILLIN-TAZOBACTAM 3.375 G IVPB 30 MIN
3.3750 g | Freq: Once | INTRAVENOUS | Status: AC
  Administered 2020-02-28: 3.375 g via INTRAVENOUS
  Filled 2020-02-27: qty 50

## 2020-02-27 NOTE — ED Triage Notes (Signed)
Pt here from [facility]. Pt has been having abd pain and constipation for several days. Today abd xray this afternoon showing possible ileus. Pt sent over for eval.

## 2020-02-27 NOTE — ED Provider Notes (Signed)
[facility] EMERGENCY DEPARTMENT Provider Note   CSN: [id number] Arrival date & time: 02/27/20  2039     History Chief Complaint  Patient presents with  . Abdominal Pain    Jeffrey Ho is a 85 y.o. male past medical history of chronic constipation, PMR, prolonged QT interval, hyponatremia, pneumonia who presents for evaluation of abdominal pain, constipation.  Patient recently admitted to [facility] for pneumonia and was discharged on 02/19/2020 to the [facility].  He comes in today for evaluation of constipation and abdominal pain.  Per wife who provides most the history, patient has been having some generalized abdominal pain and constipation for last several days.  She states that yesterday, they started noticing abdominal distention.  He reports that he had a liquid bowel movement earlier today but otherwise has not been having any other bowel movements.  He does not have any history of surgery on his abdomen.  They did a KUB earlier today at the nursing center which showed evidence of ileus and was sent to the emergency department for further evaluation.  Patient has had some intermittent episodes of vomiting.  No blood noted in stools or emesis.  He has not had any fevers, chest pain, difficulty breathing, urinary complaints.  The history is provided by the patient and the spouse.       Past Medical History:  Diagnosis Date  . Blindness of right eye   . Carotid artery occlusion   . HOH (hard of hearing)   . Hyperlipidemia   . Hypertension     Patient Active Problem List   Diagnosis Date Noted  . Proctitis 02/28/2020  . Chronic constipation 02/23/2020  . PMR (polymyalgia rheumatica) (HCC) 02/23/2020  . Bilateral lower extremity edema 02/23/2020  . AKI (acute kidney injury) (HCC) 02/21/2020  . Failure to thrive in adult 02/11/2020  . Tachypnea 02/11/2020  . Physical deconditioning 02/11/2020  . Weakness 01/24/2020  . Symptomatic anemia 01/23/2020  . Prolonged QT interval  01/23/2020  . Hyponatremia 01/23/2020  . Hypoalbuminemia due to protein-calorie malnutrition (HCC) 01/23/2020  . Leukocytosis 01/23/2020  . HTN (hypertension) 12/31/2019  . Carotid artery disease without cerebral infarction (HCC) 12/31/2019  . Cardiac ischemia 12/31/2019  . CAP (community acquired pneumonia) 12/31/2019  . Hyperlipidemia 12/31/2019  . Heme positive stool 12/31/2019  . Occlusion and stenosis of carotid artery without mention of cerebral infarction 08/22/2012    Past Surgical History:  Procedure Laterality Date  . BIOPSY  01/26/2020   Procedure: BIOPSY;  Surgeon: Corbin Ade, MD;  Location: [facility];  Service: Endoscopy;;  gastric biopsies for H. Pylori  . CAROTID ENDARTERECTOMY     left CEA  06/24.2010  . CAROTID ENDARTERECTOMY     right CEA  02/20/2009  . COLONOSCOPY WITH PROPOFOL N/A 01/26/2020   Procedure: COLONOSCOPY WITH PROPOFOL;  Surgeon: Corbin Ade, MD;  Location: [facility];  Service: Endoscopy;  Laterality: N/A;  . ESOPHAGOGASTRODUODENOSCOPY (EGD) WITH PROPOFOL N/A 01/26/2020   Procedure: ESOPHAGOGASTRODUODENOSCOPY (EGD) WITH PROPOFOL;  Surgeon: Corbin Ade, MD;  Location: [facility];  Service: Endoscopy;  Laterality: N/A;  . EYE SURGERY Left Nov. 2015   Cataract  . POLYPECTOMY  01/26/2020   Procedure: POLYPECTOMY;  Surgeon: Corbin Ade, MD;  Location: [facility];  Service: Endoscopy;;  Splenic Flexure polyp        Family History  Problem Relation Age of Onset  . Heart disease Father        Before age  60    Social History   Tobacco Use  . Smoking status: Former Smoker    Quit date: 08/10/1969    Years since quitting: 50.5  . Smokeless tobacco: Never Used  Substance Use Topics  . Alcohol use: No  . Drug use: No    Home Medications Prior to Admission medications   Medication Sig Start Date End Date Taking? Authorizing Provider  aspirin 81 MG tablet Take 81 mg by mouth daily.   Yes [provider]    atorvastatin (LIPITOR) 20 MG tablet 1 tablet daily. 05/07/16  Yes [provider]  benzonatate (TESSALON) 100 MG capsule Take 100 mg by mouth 4 (four) times daily as needed for cough.  12/07/19  Yes [provider]  clotrimazole-betamethasone (LOTRISONE) cream Apply 1 application topically 2 (two) times daily as needed. fungal erythema of skin folds. 01/23/20  Yes [provider]  feeding supplement, ENSURE ENLIVE, (ENSURE ENLIVE) LIQD Take 237 mLs by mouth 3 (three) times daily between meals. 02/19/20  Yes Thurnell Lose, MD  ferrous sulfate 325 (65 FE) MG tablet Take 1 tablet (325 mg total) by mouth 2 (two) times daily with a meal. 02/19/20 02/18/21 Yes Thurnell Lose, MD  folic acid (FOLVITE) 1 MG tablet Take 1 mg by mouth daily. 01/23/20  Yes [provider]  furosemide (LASIX) 40 MG tablet Take 1 tablet (40 mg total) by mouth daily. 02/19/20  Yes Thurnell Lose, MD  lactulose (CHRONULAC) 10 GM/15ML solution Take 10 g by mouth 2 (two) times daily. Give 15 ml  02/26/20  Yes [provider]  magnesium citrate SOLN Take 1 Bottle by mouth once.   Yes [provider]  magnesium hydroxide (MILK OF MAGNESIA) 400 MG/5ML suspension Take 30 mLs by mouth daily as needed for mild constipation. 02/20/20  Yes [provider]  metoprolol tartrate (LOPRESSOR) 25 MG tablet Take 12.5 mg by mouth 2 (two) times daily with a meal.   Yes [provider]  mineral oil enema Place 1 enema rectally once.   Yes [provider]  niacin 500 MG tablet Take 500 mg by mouth daily with breakfast.   Yes [provider]  ondansetron (ZOFRAN) 4 MG tablet Take 4 mg by mouth every 8 (eight) hours as needed for nausea/vomiting. 01/27/20  Yes [provider]  polyethylene glycol (MIRALAX) 17 g packet Take 17 g by mouth daily. Patient taking differently: Take 17 g by mouth 2 (two) times daily.  02/19/20  Yes Thurnell Lose, MD  potassium  chloride (KLOR-CON) 10 MEQ tablet Take 20 mEq by mouth 2 (two) times daily. 02/27/20  Yes [provider]  predniSONE (DELTASONE) 10 MG tablet Take 30 mg by mouth daily with breakfast.   Yes [provider]  sennosides-docusate sodium (SENOKOT-S) 8.6-50 MG tablet Take 2 tablets by mouth 2 (two) times daily. 02/22/20  Yes [provider]    Allergies    Patient has no known allergies.  Review of Systems   Review of Systems  Constitutional: Negative for fever.  Respiratory: Negative for cough and shortness of breath.   Cardiovascular: Negative for chest pain.  Gastrointestinal: Positive for abdominal pain, constipation and vomiting. Negative for nausea.  Genitourinary: Negative for dysuria and hematuria.  Neurological: Positive for weakness (generalized). Negative for headaches.  All other systems reviewed and are negative.   Physical Exam Updated Vital Signs BP (!) 152/78 (BP Location: Right Arm)   Pulse 87   Temp Marland Kitchen)  97.2 F (36.2 C) (Oral)   Resp 18   Ht 6' (1.829 m)   Wt 91.8 kg   SpO2 100%   BMI 27.45 kg/m   Physical Exam Vitals and nursing note reviewed.  Constitutional:      Appearance: Normal appearance. He is well-developed.  HENT:     Head: Normocephalic and atraumatic.  Eyes:     General: Lids are normal.     Conjunctiva/sclera: Conjunctivae normal.     Pupils: Pupils are equal, round, and reactive to light.  Cardiovascular:     Rate and Rhythm: Normal rate and regular rhythm.     Pulses: Normal pulses.     Heart sounds: Normal heart sounds. No murmur. No friction rub. No gallop.   Pulmonary:     Effort: Pulmonary effort is normal.     Breath sounds: Normal breath sounds.     Comments: Lungs clear to auscultation bilaterally.  Symmetric chest rise.  No wheezing, rales, rhonchi. Abdominal:     General: Bowel sounds are decreased. There is distension.     Palpations: Abdomen is not rigid.     Tenderness: There is abdominal  tenderness. There is no guarding.     Comments: Abdomen is slightly distended.  Tenderness noted diffusely but slightly more worse than left lower quadrant.  He has decreased bowel sounds.  No rigidity, guarding.  Musculoskeletal:        General: Normal range of motion.     Cervical back: Full passive range of motion without pain.  Skin:    General: Skin is warm and dry.     Capillary Refill: Capillary refill takes less than 2 seconds.  Neurological:     Mental Status: He is alert and oriented to person, place, and time.  Psychiatric:        Speech: Speech normal.     ED Results / Procedures / Treatments   Labs (all labs ordered are listed, but only abnormal results are displayed) Labs Reviewed  COMPREHENSIVE METABOLIC PANEL - Abnormal; Notable for the following components:      Result Value   Sodium 127 (*)    Potassium 3.0 (*)    Chloride 89 (*)    Glucose, Bld 145 (*)    BUN 41 (*)    Creatinine, Ser 1.56 (*)    Calcium 8.0 (*)    Albumin 2.3 (*)    AST 52 (*)    ALT 50 (*)    GFR calc non Af Amer 41 (*)    GFR calc Af Amer 47 (*)    All other components within normal limits  CBC WITH DIFFERENTIAL/PLATELET - Abnormal; Notable for the following components:   WBC 14.9 (*)    RBC 3.73 (*)    Hemoglobin 10.0 (*)    HCT 32.4 (*)    RDW 16.6 (*)    Platelets 413 (*)    Neutro Abs 11.9 (*)    Monocytes Absolute 1.1 (*)    Abs Immature Granulocytes 0.08 (*)    All other components within normal limits  LIPASE, BLOOD  URINALYSIS, ROUTINE W REFLEX MICROSCOPIC  CBC WITH DIFFERENTIAL/PLATELET  COMPREHENSIVE METABOLIC PANEL  MAGNESIUM  PHOSPHORUS    EKG None  Radiology CT ABDOMEN PELVIS W CONTRAST  Result Date: 02/27/2020 CLINICAL DATA:  Abdominal pain. Constipation. Possible ileus on abdominal x-ray earlier today. EXAM: CT ABDOMEN AND PELVIS WITH CONTRAST TECHNIQUE: Multidetector CT imaging of the abdomen and pelvis was performed using the standard protocol following  bolus administration of intravenous contrast. CONTRAST:  75mL OMNIPAQUE IOHEXOL 300 MG/ML  SOLN COMPARISON:  CT 2 and half weeks ago 02/11/2020. FINDINGS: Lower chest: Improved pleural effusions from prior abdominal CT, small residual on the right. Heart is normal in size. Hepatobiliary: No focal hepatic lesion. Small gallstones without pericholecystic inflammation or biliary dilatation. Pancreas: No ductal dilatation or inflammation. Spleen: Normal in size without focal abnormality. Adrenals/Urinary Tract: Normal adrenal glands. No hydronephrosis or perinephric edema. Homogeneous renal enhancement with symmetric excretion on delayed phase imaging. Urinary bladder is physiologically distended without wall thickening. Stomach/Bowel: There is diffuse colonic distension. Liquid stool with air-fluid levels involves the cecum, ascending, and transverse colon with associated gaseous distension. More formed stool is seen in the descending and sigmoid colon. Sigmoid colon is redundant. Scattered sigmoid colonic diverticula without acute diverticulitis. Inspissated stool ball within the rectum with rectal distention of 8.2 cm. There is circumferential rectal wall thickening. No definite perirectal edema. Scattered foci of air adjacent to stool ball in the rectum may be adjacent to large stool ball versus small foci of pneumatosis. There is no evidence of perforation or extraluminal air. The appendix is partially obscured by small volume of fluid in the right pericolic gutter, no evidence of appendicitis. There is fecalization of scattered small bowel contents without abnormal small bowel distention or small bowel obstruction. Stomach is unremarkable. Vascular/Lymphatic: Advanced aortic and branch atherosclerosis. Suspected stenosis at the origin of the celiac and SMA arteries due to calcified plaque. There is no mesenteric or portal venous gas. Portal vein is patent. No adenopathy. Reproductive: Prostate is unremarkable.  Other: Small amount of free fluid in the right pericolic gutter. No free air or perforation. No intra-abdominal abscess. Minimal fat in the inguinal canals. Musculoskeletal: Diffuse degenerative change throughout the spine. There are no acute or suspicious osseous abnormalities. IMPRESSION: 1. Inspissated stool ball within the rectum with rectal distention of 8.2 cm, consistent with fecal impaction. There is circumferential rectal wall thickening which may represent stercoral proctitis, however no definite perirectal edema. Scattered foci of air adjacent to the large stool ball in the rectum may be air in the bowel lumen versus small foci of pneumatosis. There is no evidence of perforation or extraluminal air. 2. Diffuse colonic distension, formed stool in the descending and sigmoid colon, with liquid stool and air proximally. No small bowel obstruction. 3. Small amount of free fluid in the right pericolic gutter is likely reactive. 4. Cholelithiasis without gallbladder inflammation. 5. Improved pleural effusions from prior abdominal CT, small residual on the right. 6. Advanced aortic atherosclerosis. 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 Aortic Atherosclerosis (ICD10-I70.0). Electronically Signed   By: Narda Rutherford M.D.   On: 02/27/2020 22:32    Procedures Fecal disimpaction  Date/Time: 02/28/2020 12:45 AM Performed by: Maxwell Caul, PA-C Authorized by: Maxwell Caul, PA-C  Consent: Verbal consent obtained. Consent given by: patient Patient understanding: patient states understanding of the procedure being performed Patient consent: the patient's understanding of the procedure matches consent given Procedure consent: procedure consent matches procedure scheduled Relevant documents: relevant documents present and verified Test results: test results available and properly labeled Site marked: the operative site was marked Imaging studies:  imaging studies available Patient identity confirmed: verbally with patient Time out: Immediately prior to procedure a "time out" was called to verify the correct patient, procedure, equipment, support staff and site/side marked as required.    (including critical care time)  Medications  Ordered in ED Medications  piperacillin-tazobactam (ZOSYN) IVPB 3.375 g (3.375 g Intravenous New Bag/Given 02/28/20 0039)  0.9 % NaCl with KCl 40 mEq / L  infusion (has no administration in time range)  prochlorperazine (COMPAZINE) injection 5 mg (has no administration in time range)  acetaminophen (TYLENOL) tablet 650 mg (has no administration in time range)    Or  acetaminophen (TYLENOL) suppository 650 mg (has no administration in time range)  iohexol (OMNIPAQUE) 300 MG/ML solution 75 mL (75 mLs Intravenous Contrast Given 02/27/20 2207)  sodium chloride 0.9 % bolus 1,000 mL (1,000 mLs Intravenous New Bag/Given 02/28/20 0041)  sodium phosphate (FLEET) 7-19 GM/118ML enema 1 enema (1 enema Rectal Given 02/28/20 0024)    ED Course  I have reviewed the triage vital signs and the nursing notes.  Pertinent labs & imaging results that were available during my care of the patient were reviewed by me and considered in my medical decision making (see chart for details).    MDM Rules/Calculators/A&P                      85 year old male who presents for evaluation of abdominal pain, constipation.  He is also had some intermittent episodes of vomiting.  He was [facility] of pneumonia.  He was discharged on 02/19/2020.  He reports that over the last 4 to 5 days, he has had some abdominal pain.  Wife noticed some abdominal distention yesterday.  He is also had constipation and vomiting.  They were trying to do laxatives and reports that today he had a purely liquid bowel movement but still has had abdominal discomfort.  They obtained a KUB which showed evaluation of ileus and he was  transferred to the emergency department for further evaluation.  Patient reports no history of surgery on his abdomen.  He denies any chest pain, difficulty breathing.  On exam, he is afebrile, nontoxic-appearing.  Vital signs are stable.  He has some slight abdominal distention with decreased bowel sounds.  He has tenderness noted mostly in the left lower quadrant.  Concern for bowel obstruction versus infectious process versus fecal impaction.  Plan for labs, imaging.  CMP shows potassium of 3.0, sodium of 127.  BUN is 41, creatinine is 1.56.  He has had elevations in his creatinine previously during his hospital admissions that have ranged between 1.40-1.47.  CBC shows slight leukocytosis of 14.9.  Hemoglobin is 10.0.  Lipase is 19.  CT scan shows stool ball noted in the rectum with rectal distention.  He has evidence of fecal impaction.  He also has evidence of stercoral proctitis.  No evidence of small bowel obstruction.  He has evidence of cholelithiasis without gallbladder inflammation.  I discussed with the nurse at the [facility].  They have been trying to manage his hypokalemia for the last several days as well as managing his abdominal pain, constipation.  They have been giving suppositories but have only been able to have liquid bowel movements.  Additionally, patient has not been able to tolerate much p.o.  At this time, given that he is continue to have constipation that has transitioned into colitis as well as continued hypokalemia, feel that patient would benefit from admission and more aggressive therapy.  Discussed results with patient and wife.  We will plan for fecal impaction here in the ED as well as enema.  I discussed with the nursing home center.  They have been managing this for several  days.  His potassium has been low and they have been increasing his oral potassium.  Additionally, they have been giving laxative suppositories but patient still having constipation.  They do report  he had a liquid bowel movement earlier today.  Given worsening constipation, hypokalemia as well as concern for early stercoral proctitis, will plan for admission. Discussed patient with Dr. Lynelle Doctor who is agreeable.  Fecal impaction as documented above.  Patient with good amount of stool that was removed from the rectal cavity.  Enema was placed which showed good fecal output.  Discussed patient with Dr. Robb Matar (hospitalist) who accepts patient for admission.   Portions of this note were generated with Scientist, clinical (histocompatibility and immunogenetics). Dictation errors may occur despite best attempts at proofreading.  Final Clinical Impression(s) / ED Diagnoses Final diagnoses:  Colitis  Hypokalemia  Hyponatremia  Constipation, unspecified constipation type    Rx / DC Orders ED Discharge Orders    None       Rosana Hoes 02/28/20 2094    Linwood Dibbles, MD 02/28/20 2020

## 2020-02-27 NOTE — Progress Notes (Signed)
Location:    [place] Room Number: 134/P Place of Service:  SNF (31)   CODE STATUS: DNR  No Known Allergies  Chief Complaint  Patient presents with  . Follow-up    Lab Follow Up    HPI:  His k+ is 3.0 and he is taking k+ 10 meq daily. His abdomen is distended but soft with hyperactive bowel sounds; no tenderness present. His bowels are moving. He needs to pass gas. There are no reports of fevers present.   Past Medical History:  Diagnosis Date  . Blindness of right eye   . Carotid artery occlusion   . HOH (hard of hearing)   . Hyperlipidemia   . Hypertension     Past Surgical History:  Procedure Laterality Date  . BIOPSY  01/26/2020   Procedure: BIOPSY;  Surgeon: Daneil Dolin, MD;  [facility];  Service: Endoscopy;;  gastric biopsies for H. Pylori  . CAROTID ENDARTERECTOMY     left CEA  06/24.2010  . CAROTID ENDARTERECTOMY     right CEA  02/20/2009  . COLONOSCOPY WITH PROPOFOL N/A 01/26/2020   Procedure: COLONOSCOPY WITH PROPOFOL;  Surgeon: Daneil Dolin, MD;  [facility];  Service: Endoscopy;  Laterality: N/A;  . ESOPHAGOGASTRODUODENOSCOPY (EGD) WITH PROPOFOL N/A 01/26/2020   Procedure: ESOPHAGOGASTRODUODENOSCOPY (EGD) WITH PROPOFOL;  Surgeon: Daneil Dolin, MD;  [facility];  Service: Endoscopy;  Laterality: N/A;  . EYE SURGERY Left Nov. 2015   Cataract  . POLYPECTOMY  01/26/2020   Procedure: POLYPECTOMY;  Surgeon: Daneil Dolin, MD;  [facility];  Service: Endoscopy;;  Splenic Flexure polyp     Social History   Socioeconomic History  . Marital status: Married    Spouse name: Not on file  . Number of children: Not on file  . Years of education: Not on file  . Highest education level: Not on file  Occupational History  . Not on file  Tobacco Use  . Smoking status: Former Smoker    Quit date: 08/10/1969    Years since quitting: 50.5  . Smokeless tobacco: Never Used  Substance and  Sexual Activity  . Alcohol use: No  . Drug use: No  . Sexual activity: Not on file  Other Topics Concern  . Not on file  Social History Narrative  . Not on file   Social Determinants of Health   Financial Resource Strain:   . Difficulty of Paying Living Expenses:   Food Insecurity:   . Worried About Charity fundraiser in the Last Year:   . Arboriculturist in the Last Year:   Transportation Needs:   . Film/video editor (Medical):   Marland Kitchen Lack of Transportation (Non-Medical):   Physical Activity:   . Days of Exercise per Week:   . Minutes of Exercise per Session:   Stress:   . Feeling of Stress :   Social Connections:   . Frequency of Communication with Friends and Family:   . Frequency of Social Gatherings with Friends and Family:   . Attends Religious Services:   . Active Member of Clubs or Organizations:   . Attends Archivist Meetings:   Marland Kitchen Marital Status:   Intimate Partner Violence:   . Fear of Current or Ex-Partner:   . Emotionally Abused:   Marland Kitchen Physically Abused:   . Sexually Abused:    Family History  Problem Relation Age of Onset  .  Heart disease Father        Before age 66      VITAL SIGNS BP (!) 149/76   Pulse 84   Temp (!) 97.4 F (36.3 C) (Oral)   Resp 20   Ht 6' (1.829 m)   Wt 202 lb 9.6 oz (91.9 kg)   BMI 27.48 kg/m   Outpatient Encounter Medications as of 02/27/2020  Medication Sig  . aspirin 81 MG tablet Take 81 mg by mouth daily.  Marland Kitchen atorvastatin (LIPITOR) 20 MG tablet 1 tablet daily.  . benzonatate (TESSALON) 100 MG capsule Take 100 mg by mouth 4 (four) times daily as needed for cough.   . clotrimazole-betamethasone (LOTRISONE) cream Apply topically. Apply to skin bid prn fungal erythema of skin folds.  . feeding supplement, ENSURE ENLIVE, (ENSURE ENLIVE) LIQD Take 237 mLs by mouth 3 (three) times daily between meals.  . ferrous sulfate 325 (65 FE) MG tablet Take 1 tablet (325 mg total) by mouth 2 (two) times daily with a meal.    . fish oil-omega-3 fatty acids 1000 MG capsule Take 1 g by mouth daily.   . folic acid (FOLVITE) 1 MG tablet Take 1 mg by mouth daily.  . furosemide (LASIX) 40 MG tablet Take 1 tablet (40 mg total) by mouth daily.  Marland Kitchen lactulose (CHRONULAC) 10 GM/15ML solution Take by mouth 2 (two) times daily. Give 15 ml  . magnesium hydroxide (MILK OF MAGNESIA) 400 MG/5ML suspension Take 30 mLs by mouth daily as needed for mild constipation.  . metoprolol tartrate (LOPRESSOR) 25 MG tablet Take 12.5 mg by mouth 2 (two) times daily with a meal.  . niacin 500 MG tablet Take 500 mg by mouth daily with breakfast.  . NON FORMULARY Diet: _____ Regular, ___x___ NAS, _______Consistent Carbohydrate, _______NPO _____Other  . ondansetron (ZOFRAN) 4 MG tablet Take 4 mg by mouth every 8 (eight) hours as needed for nausea/vomiting.  . polyethylene glycol (MIRALAX) 17 g packet Take 17 g by mouth daily.  . potassium chloride (KLOR-CON) 10 MEQ tablet Take 20 mEq by mouth 2 (two) times daily.  . predniSONE (DELTASONE) 10 MG tablet Take 30 mg by mouth daily with breakfast.  . sennosides-docusate sodium (SENOKOT-S) 8.6-50 MG tablet Take 2 tablets by mouth 2 (two) times daily.  . [DISCONTINUED] docusate sodium (COLACE) 100 MG capsule Take 2 capsules (200 mg total) by mouth 2 (two) times daily.  . [DISCONTINUED] metoprolol tartrate (LOPRESSOR) 25 MG tablet Take 0.5 tablets (12.5 mg total) by mouth 2 (two) times daily.  . [DISCONTINUED] potassium chloride SA (KLOR-CON) 10 MEQ tablet Take 1 tablet (10 mEq total) by mouth daily.   No facility-administered encounter medications on file as of 02/27/2020.     SIGNIFICANT DIAGNOSTIC EXAMS  PREVIOUS  02-11-20: chest x-ray;  Scarring right mid lung. Question scarring lateral right base versus small right pleural effusion. Lungs elsewhere clear. Stable cardiac silhouette.  Aortic Atherosclerosis   02-11-20: ct of abdomen and pelvis:  1. Colonic diverticulosis, without radiographic  evidence of diverticulitis or other acute findings. 2. Cholelithiasis. No radiographic evidence of cholecystitis. 3. Tiny bilateral pleural effusions. Aortic Atherosclerosis   02-11-20: MRI of brain:  1. No acute or reversible finding. 2. Brain atrophy that has progressed from 2010. 3. Left sphenoid sinus and bilateral mastoid opacification with negative nasopharynx.  02-12-20: ct of chest:  1. Coronary artery calcifications are noted suggesting coronary artery disease. 2. Minimal right pleural effusion is noted. 3. Stable right posterior upper lobe opacity is noted  concerning for pneumonia or possibly scarring. Stable right middle lobe scarring is noted. 4. Stable left apical scarring is noted. 5. Minimal bilateral posterior basilar subsegmental atelectasis is noted. Aortic Atherosclerosis   NO NEW EXAMS.   LABS REVIEWED PREVIOUS  02-11-20: wbc 18.5; hgb 8.0; hct 26.6; mcv 88.4 plt 421; glucose 108; bun 20; creat 1.23; k+ 4.2; na++ 129; ca 7.9; alk phos 190; albumin 1.4 vit B 12: 436 folate 14.2; iron 18 tibc 133; ferritin 1812 blood culture: no growth 02-12-20: uric acid 3.8 02-13-20: wbc 21.1; hgb 7.7; hct 24.8; mcv 85.5 plt 422; glucose 114; bun 23; creat 1.28; k+ 3.6; na++ 128; ca 7.5 alk phos 184; albumin 1.2 PSA 0.08 02-15-20: CK 11; ANCA titer 1: 160; RA 27.0 ANA: neg; sed rate 110 02-18-20: wbc 24.1; hgb 9.9; hct 32.2; mcv 86.1 plt 612; glucose 131; bun 59; creat 1.44; k+ 3.7; na++ 133; ca 7.9  02-19-20: wbc 24.0; hgb 8.8; hct 28,3 mcv 85.8 plt 485; glucose 137; bun 63; creat 1.47; k+ 4.1; na++ 132; ca 7.9   TODAY  02-26-20 wbc 19,2l hgb 9.7; hct 31.6; mcv 86.1 plt 429;  02-27-20 glucose 117; bun 38; creat 1.44; k+3.0; na++ 7.9     Review of Systems  Constitutional: Negative for malaise/fatigue.  Respiratory: Negative for cough and shortness of breath.   Cardiovascular: Negative for chest pain, palpitations and leg swelling.  Gastrointestinal: Negative for abdominal pain,  constipation, heartburn and nausea.  Musculoskeletal: Negative for back pain, joint pain and myalgias.  Skin: Negative.   Neurological: Negative for dizziness.  Psychiatric/Behavioral: The patient is not nervous/anxious.     Physical Exam Constitutional:      General: He is not in acute distress.    Appearance: He is well-developed. He is not diaphoretic.  Eyes:     Comments: Right eye blindness   Neck:     Thyroid: No thyromegaly.  Cardiovascular:     Rate and Rhythm: Normal rate and regular rhythm.     Pulses: Normal pulses.     Heart sounds: Murmur present.  Pulmonary:     Effort: Pulmonary effort is normal. No respiratory distress.     Breath sounds: Normal breath sounds.  Abdominal:     General: There is distension.     Palpations: Abdomen is soft.     Tenderness: There is no abdominal tenderness.     Comments: Bowel sounds are hyperactive   Musculoskeletal:        General: Normal range of motion.     Cervical back: Neck supple.     Right lower leg: No edema.     Left lower leg: No edema.  Lymphadenopathy:     Cervical: No cervical adenopathy.  Skin:    General: Skin is warm and dry.  Neurological:     Mental Status: He is alert and oriented to person, place, and time.  Psychiatric:        Mood and Affect: Mood normal.       ASSESSMENT/ PLAN:  TODAY  1. Chronic constipation 2. Hypokalemia  We will ambulation and move him until he passes gas Will begin k+ 20 meq twice daily  Will repeat k+ on 03-05-11.  His bowels are moving   MD is aware of resident's narcotic use and is in agreement with current plan of care. We will attempt to wean resident as appropriate.  Ok Edwards NP [facility]  Contact [phone] Monday through Friday 8am- 5pm  After hours call [phone]

## 2020-02-28 ENCOUNTER — Encounter: Payer: Self-pay | Admitting: Adult Health

## 2020-02-28 DIAGNOSIS — K5909 Other constipation: Secondary | ICD-10-CM | POA: Diagnosis not present

## 2020-02-28 DIAGNOSIS — K529 Noninfective gastroenteritis and colitis, unspecified: Secondary | ICD-10-CM

## 2020-02-28 DIAGNOSIS — R627 Adult failure to thrive: Secondary | ICD-10-CM | POA: Diagnosis present

## 2020-02-28 DIAGNOSIS — E876 Hypokalemia: Secondary | ICD-10-CM | POA: Diagnosis present

## 2020-02-28 DIAGNOSIS — Z87891 Personal history of nicotine dependence: Secondary | ICD-10-CM | POA: Diagnosis not present

## 2020-02-28 DIAGNOSIS — E871 Hypo-osmolality and hyponatremia: Secondary | ICD-10-CM | POA: Diagnosis present

## 2020-02-28 DIAGNOSIS — Z7982 Long term (current) use of aspirin: Secondary | ICD-10-CM | POA: Diagnosis not present

## 2020-02-28 DIAGNOSIS — Z6827 Body mass index (BMI) 27.0-27.9, adult: Secondary | ICD-10-CM | POA: Diagnosis not present

## 2020-02-28 DIAGNOSIS — Z79899 Other long term (current) drug therapy: Secondary | ICD-10-CM | POA: Diagnosis not present

## 2020-02-28 DIAGNOSIS — K5641 Fecal impaction: Secondary | ICD-10-CM | POA: Diagnosis present

## 2020-02-28 DIAGNOSIS — M353 Polymyalgia rheumatica: Secondary | ICD-10-CM | POA: Diagnosis present

## 2020-02-28 DIAGNOSIS — Z8249 Family history of ischemic heart disease and other diseases of the circulatory system: Secondary | ICD-10-CM | POA: Diagnosis not present

## 2020-02-28 DIAGNOSIS — K6289 Other specified diseases of anus and rectum: Secondary | ICD-10-CM | POA: Diagnosis present

## 2020-02-28 DIAGNOSIS — H5461 Unqualified visual loss, right eye, normal vision left eye: Secondary | ICD-10-CM | POA: Diagnosis present

## 2020-02-28 DIAGNOSIS — I10 Essential (primary) hypertension: Secondary | ICD-10-CM | POA: Diagnosis not present

## 2020-02-28 DIAGNOSIS — K8020 Calculus of gallbladder without cholecystitis without obstruction: Secondary | ICD-10-CM | POA: Diagnosis present

## 2020-02-28 DIAGNOSIS — Z7952 Long term (current) use of systemic steroids: Secondary | ICD-10-CM | POA: Diagnosis not present

## 2020-02-28 DIAGNOSIS — E785 Hyperlipidemia, unspecified: Secondary | ICD-10-CM | POA: Diagnosis present

## 2020-02-28 DIAGNOSIS — Z66 Do not resuscitate: Secondary | ICD-10-CM | POA: Diagnosis present

## 2020-02-28 LAB — CBC WITH DIFFERENTIAL/PLATELET
Abs Immature Granulocytes: 0.06 10*3/uL (ref 0.00–0.07)
Basophils Absolute: 0 10*3/uL (ref 0.0–0.1)
Basophils Relative: 0 %
Eosinophils Absolute: 0 10*3/uL (ref 0.0–0.5)
Eosinophils Relative: 0 %
HCT: 30.5 % — ABNORMAL LOW (ref 39.0–52.0)
Hemoglobin: 9.2 g/dL — ABNORMAL LOW (ref 13.0–17.0)
Immature Granulocytes: 0 %
Lymphocytes Relative: 11 %
Lymphs Abs: 1.7 10*3/uL (ref 0.7–4.0)
MCH: 26.3 pg (ref 26.0–34.0)
MCHC: 30.2 g/dL (ref 30.0–36.0)
MCV: 87.1 fL (ref 80.0–100.0)
Monocytes Absolute: 1.1 10*3/uL — ABNORMAL HIGH (ref 0.1–1.0)
Monocytes Relative: 7 %
Neutro Abs: 12.4 10*3/uL — ABNORMAL HIGH (ref 1.7–7.7)
Neutrophils Relative %: 82 %
Platelets: 399 10*3/uL (ref 150–400)
RBC: 3.5 MIL/uL — ABNORMAL LOW (ref 4.22–5.81)
RDW: 16.6 % — ABNORMAL HIGH (ref 11.5–15.5)
WBC: 15.3 10*3/uL — ABNORMAL HIGH (ref 4.0–10.5)
nRBC: 0 % (ref 0.0–0.2)

## 2020-02-28 LAB — COMPREHENSIVE METABOLIC PANEL
ALT: 39 U/L (ref 0–44)
AST: 40 U/L (ref 15–41)
Albumin: 2.1 g/dL — ABNORMAL LOW (ref 3.5–5.0)
Alkaline Phosphatase: 94 U/L (ref 38–126)
Anion gap: 10 (ref 5–15)
BUN: 39 mg/dL — ABNORMAL HIGH (ref 8–23)
CO2: 27 mmol/L (ref 22–32)
Calcium: 7.7 mg/dL — ABNORMAL LOW (ref 8.9–10.3)
Chloride: 93 mmol/L — ABNORMAL LOW (ref 98–111)
Creatinine, Ser: 1.56 mg/dL — ABNORMAL HIGH (ref 0.61–1.24)
GFR calc Af Amer: 47 mL/min — ABNORMAL LOW (ref >60)
GFR calc non Af Amer: 41 mL/min — ABNORMAL LOW (ref >60)
Glucose, Bld: 110 mg/dL — ABNORMAL HIGH (ref 70–99)
Potassium: 2.9 mmol/L — ABNORMAL LOW (ref 3.5–5.1)
Sodium: 130 mmol/L — ABNORMAL LOW (ref 135–145)
Total Bilirubin: 0.6 mg/dL (ref 0.3–1.2)
Total Protein: 5.9 g/dL — ABNORMAL LOW (ref 6.5–8.1)

## 2020-02-28 LAB — URINALYSIS, ROUTINE W REFLEX MICROSCOPIC
Bacteria, UA: NONE SEEN
Bilirubin Urine: NEGATIVE
Glucose, UA: 50 mg/dL — AB
Ketones, ur: NEGATIVE mg/dL
Nitrite: NEGATIVE
Protein, ur: 30 mg/dL — AB
Specific Gravity, Urine: 1.027 (ref 1.005–1.030)
WBC, UA: >50 WBC/hpf — ABNORMAL HIGH (ref 0–5)
pH: 5 (ref 5.0–8.0)

## 2020-02-28 LAB — PHOSPHORUS: Phosphorus: 3.3 mg/dL (ref 2.5–4.6)

## 2020-02-28 LAB — MAGNESIUM: Magnesium: 2.5 mg/dL — ABNORMAL HIGH (ref 1.7–2.4)

## 2020-02-28 MED ORDER — CLOTRIMAZOLE 1 % EX CREA
TOPICAL_CREAM | Freq: Two times a day (BID) | CUTANEOUS | Status: DC
  Filled 2020-02-28: qty 15

## 2020-02-28 MED ORDER — PREDNISONE 20 MG PO TABS
30.0000 mg | ORAL_TABLET | Freq: Every day | ORAL | Status: DC
  Administered 2020-02-28 – 2020-03-01 (×3): 30 mg via ORAL
  Filled 2020-02-28 (×3): qty 1

## 2020-02-28 MED ORDER — METOPROLOL TARTRATE 25 MG PO TABS
12.5000 mg | ORAL_TABLET | Freq: Two times a day (BID) | ORAL | Status: DC
  Administered 2020-02-28 – 2020-03-01 (×4): 12.5 mg via ORAL
  Filled 2020-02-28 (×6): qty 1

## 2020-02-28 MED ORDER — ACETAMINOPHEN 325 MG PO TABS: 650.0000 mg | ORAL_TABLET | Freq: Four times a day (QID) | ORAL | Status: DC | PRN

## 2020-02-28 MED ORDER — PROCHLORPERAZINE EDISYLATE 10 MG/2ML IJ SOLN: 5.0000 mg | INTRAMUSCULAR | Status: DC | PRN

## 2020-02-28 MED ORDER — ATORVASTATIN CALCIUM 20 MG PO TABS
20.0000 mg | ORAL_TABLET | Freq: Every day | ORAL | Status: DC
  Administered 2020-02-28 – 2020-03-01 (×3): 20 mg via ORAL
  Filled 2020-02-28: qty 2
  Filled 2020-02-28 (×2): qty 1

## 2020-02-28 MED ORDER — HYDROCORT-PRAMOXINE (PERIANAL) 1-1 % EX FOAM
1.0000 | Freq: Two times a day (BID) | CUTANEOUS | Status: DC
  Administered 2020-02-29 – 2020-03-01 (×3): 1 via RECTAL
  Filled 2020-02-28: qty 10

## 2020-02-28 MED ORDER — LINACLOTIDE 145 MCG PO CAPS
145.0000 ug | ORAL_CAPSULE | Freq: Every day | ORAL | Status: DC
  Administered 2020-02-28 – 2020-02-29 (×2): 145 ug via ORAL
  Filled 2020-02-28 (×4): qty 1

## 2020-02-28 MED ORDER — POTASSIUM CHLORIDE IN NACL 40-0.9 MEQ/L-% IV SOLN
INTRAVENOUS | Status: DC
  Administered 2020-02-28 (×3): 75 mL/h via INTRAVENOUS
  Administered 2020-02-29: 50 mL/h via INTRAVENOUS
  Administered 2020-02-29: 75 mL/h via INTRAVENOUS
  Filled 2020-02-28 (×5): qty 1000

## 2020-02-28 MED ORDER — FUROSEMIDE 40 MG PO TABS: 40.0000 mg | ORAL_TABLET | Freq: Every day | ORAL | Status: DC

## 2020-02-28 MED ORDER — ASPIRIN 81 MG PO CHEW
81.0000 mg | CHEWABLE_TABLET | Freq: Every day | ORAL | Status: DC
  Administered 2020-02-29 – 2020-03-01 (×2): 81 mg via ORAL
  Filled 2020-02-28 (×2): qty 1

## 2020-02-28 MED ORDER — SENNOSIDES-DOCUSATE SODIUM 8.6-50 MG PO TABS
2.0000 | ORAL_TABLET | Freq: Two times a day (BID) | ORAL | Status: DC
  Administered 2020-02-28 – 2020-02-29 (×2): 2 via ORAL
  Filled 2020-02-28 (×11): qty 2

## 2020-02-28 MED ORDER — ACETAMINOPHEN 650 MG RE SUPP: 650.0000 mg | Freq: Four times a day (QID) | RECTAL | Status: DC | PRN

## 2020-02-28 MED ORDER — ENSURE ENLIVE PO LIQD
237.0000 mL | Freq: Three times a day (TID) | ORAL | Status: DC
  Administered 2020-02-28 – 2020-02-29 (×4): 237 mL via ORAL
  Filled 2020-02-28 (×5): qty 237

## 2020-02-28 MED ORDER — POLYETHYLENE GLYCOL 3350 17 G PO PACK
17.0000 g | PACK | Freq: Two times a day (BID) | ORAL | Status: DC
  Administered 2020-02-28 – 2020-02-29 (×2): 17 g via ORAL
  Filled 2020-02-28 (×3): qty 1

## 2020-02-28 MED ORDER — NIACIN 500 MG PO TABS
500.0000 mg | ORAL_TABLET | Freq: Every day | ORAL | Status: DC
  Administered 2020-02-28: 500 mg via ORAL
  Filled 2020-02-28 (×6): qty 1

## 2020-02-28 MED ORDER — LACTULOSE 10 GM/15ML PO SOLN
10.0000 g | Freq: Two times a day (BID) | ORAL | Status: DC
  Administered 2020-02-28 – 2020-02-29 (×3): 10 g via ORAL
  Filled 2020-02-28 (×3): qty 30

## 2020-02-28 MED ORDER — MAGNESIUM HYDROXIDE 400 MG/5ML PO SUSP: 30.0000 mL | Freq: Every day | ORAL | Status: DC | PRN

## 2020-02-28 MED ORDER — PIPERACILLIN-TAZOBACTAM 3.375 G IVPB
3.3750 g | Freq: Three times a day (TID) | INTRAVENOUS | Status: DC
  Administered 2020-02-28 – 2020-03-01 (×6): 3.375 g via INTRAVENOUS
  Filled 2020-02-28 (×6): qty 50

## 2020-02-28 MED ORDER — BENZONATATE 100 MG PO CAPS: 100.0000 mg | ORAL_CAPSULE | Freq: Four times a day (QID) | ORAL | Status: DC | PRN

## 2020-02-28 NOTE — Progress Notes (Signed)
Location:    [place] Room Number: 134/P Place of Service:  SNF (31)   CODE STATUS: DNR  No Known Allergies  Chief Complaint  Patient presents with  . Short Term Rehab    Routine Visit    HPI:    Past Medical History:  Diagnosis Date  . Blindness of right eye   . Carotid artery occlusion   . HOH (hard of hearing)   . Hyperlipidemia   . Hypertension     Past Surgical History:  Procedure Laterality Date  . BIOPSY  01/26/2020   Procedure: BIOPSY;  Surgeon: Daneil Dolin, MD;  [facility];  Service: Endoscopy;;  gastric biopsies for H. Pylori  . CAROTID ENDARTERECTOMY     left CEA  06/24.2010  . CAROTID ENDARTERECTOMY     right CEA  02/20/2009  . COLONOSCOPY WITH PROPOFOL N/A 01/26/2020   Procedure: COLONOSCOPY WITH PROPOFOL;  Surgeon: Daneil Dolin, MD;  [facility];  Service: Endoscopy;  Laterality: N/A;  . ESOPHAGOGASTRODUODENOSCOPY (EGD) WITH PROPOFOL N/A 01/26/2020   Procedure: ESOPHAGOGASTRODUODENOSCOPY (EGD) WITH PROPOFOL;  Surgeon: Daneil Dolin, MD;  [facility];  Service: Endoscopy;  Laterality: N/A;  . EYE SURGERY Left Nov. 2015   Cataract  . POLYPECTOMY  01/26/2020   Procedure: POLYPECTOMY;  Surgeon: Daneil Dolin, MD;  [facility];  Service: Endoscopy;;  Splenic Flexure polyp     Social History   Socioeconomic History  . Marital status: Married    Spouse name: Not on file  . Number of children: Not on file  . Years of education: Not on file  . Highest education level: Not on file  Occupational History  . Not on file  Tobacco Use  . Smoking status: Former Smoker    Quit date: 08/10/1969    Years since quitting: 50.5  . Smokeless tobacco: Never Used  Substance and Sexual Activity  . Alcohol use: No  . Drug use: No  . Sexual activity: Not on file  Other Topics Concern  . Not on file  Social History Narrative  . Not on file   Social Determinants of Health    Financial Resource Strain:   . Difficulty of Paying Living Expenses:   Food Insecurity:   . Worried About Charity fundraiser in the Last Year:   . Arboriculturist in the Last Year:   Transportation Needs:   . Film/video editor (Medical):   Marland Kitchen Lack of Transportation (Non-Medical):   Physical Activity:   . Days of Exercise per Week:   . Minutes of Exercise per Session:   Stress:   . Feeling of Stress :   Social Connections:   . Frequency of Communication with Friends and Family:   . Frequency of Social Gatherings with Friends and Family:   . Attends Religious Services:   . Active Member of Clubs or Organizations:   . Attends Archivist Meetings:   Marland Kitchen Marital Status:   Intimate Partner Violence:   . Fear of Current or Ex-Partner:   . Emotionally Abused:   Marland Kitchen Physically Abused:   . Sexually Abused:    Family History  Problem Relation Age of Onset  . Heart disease Father        Before age 73      VITAL SIGNS BP (!) 149/76   Pulse 84   Temp (!) 97.4 F (36.3 C) (Oral)   Resp 20  Ht 6' (1.829 m)   Wt 202 lb 9.6 oz (91.9 kg)   BMI 27.48 kg/m   Facility-Administered Encounter Medications as of 02/28/2020  Medication  . 0.9 % NaCl with KCl 40 mEq / L  infusion  . acetaminophen (TYLENOL) tablet 650 mg   Or  . acetaminophen (TYLENOL) suppository 650 mg  . aspirin chewable tablet 81 mg  . atorvastatin (LIPITOR) tablet 20 mg  . benzonatate (TESSALON) capsule 100 mg  . clotrimazole (LOTRIMIN) 1 % cream  . feeding supplement (ENSURE ENLIVE) (ENSURE ENLIVE) liquid 237 mL  . hydrocortisone-pramoxine (PROCTOFOAM-HC) rectal foam 1 applicator  . lactulose (CHRONULAC) 10 GM/15ML solution 10 g  . linaclotide (LINZESS) capsule 145 mcg  . metoprolol tartrate (LOPRESSOR) tablet 12.5 mg  . niacin tablet 500 mg  . piperacillin-tazobactam (ZOSYN) IVPB 3.375 g  . polyethylene glycol (MIRALAX / GLYCOLAX) packet 17 g  . predniSONE (DELTASONE) tablet 30 mg  .  prochlorperazine (COMPAZINE) injection 5 mg  . sennosides-docusate sodium (SENOKOT-S) 8.6-50 MG tablet 2 tablet   Outpatient Encounter Medications as of 02/28/2020  Medication Sig  . aspirin 81 MG tablet Take 81 mg by mouth daily.  Marland Kitchen atorvastatin (LIPITOR) 20 MG tablet 1 tablet daily.  . benzonatate (TESSALON) 100 MG capsule Take 100 mg by mouth 4 (four) times daily as needed for cough.   . clotrimazole-betamethasone (LOTRISONE) cream Apply 1 application topically 2 (two) times daily as needed. fungal erythema of skin folds.  . feeding supplement, ENSURE ENLIVE, (ENSURE ENLIVE) LIQD Take 237 mLs by mouth 3 (three) times daily between meals.  . ferrous sulfate 325 (65 FE) MG tablet Take 1 tablet (325 mg total) by mouth 2 (two) times daily with a meal.  . fish oil-omega-3 fatty acids 1000 MG capsule Take 1 g by mouth daily.  . folic acid (FOLVITE) 1 MG tablet Take 1 mg by mouth daily.  . furosemide (LASIX) 40 MG tablet Take 1 tablet (40 mg total) by mouth daily.  Marland Kitchen lactulose (CHRONULAC) 10 GM/15ML solution Take 10 g by mouth 2 (two) times daily. Give 15 ml   . magnesium hydroxide (MILK OF MAGNESIA) 400 MG/5ML suspension Take 30 mLs by mouth daily as needed for mild constipation.  . metoprolol tartrate (LOPRESSOR) 25 MG tablet Take 12.5 mg by mouth 2 (two) times daily with a meal.  . niacin 500 MG tablet Take 500 mg by mouth daily with breakfast.  . NON FORMULARY Diet: _____ Regular, ___x___ NAS, _______Consistent Carbohydrate, _______NPO _____Other  . ondansetron (ZOFRAN) 4 MG tablet Take 4 mg by mouth every 8 (eight) hours as needed for nausea/vomiting.  . polyethylene glycol (MIRALAX / GLYCOLAX) 17 g packet Take 17 g by mouth 2 (two) times daily.  . potassium chloride (KLOR-CON) 10 MEQ tablet Take 20 mEq by mouth 2 (two) times daily.  . predniSONE (DELTASONE) 10 MG tablet Take 30 mg by mouth daily with breakfast.  . sennosides-docusate sodium (SENOKOT-S) 8.6-50 MG tablet Take 2 tablets by  mouth 2 (two) times daily.  . [DISCONTINUED] magnesium citrate SOLN Take 1 Bottle by mouth once.  . [DISCONTINUED] mineral oil enema Place 1 enema rectally once.  . [DISCONTINUED] polyethylene glycol (MIRALAX) 17 g packet Take 17 g by mouth daily. (Patient taking differently: Take 17 g by mouth 2 (two) times daily. )     SIGNIFICANT DIAGNOSTIC EXAMS       ASSESSMENT/ PLAN:    MD is aware of resident's narcotic use and is in agreement with current plan  of care. We will attempt to wean resident as appropriate.  Ok Edwards NP [facility]  Contact [phone] Monday through Friday 8am- 5pm  After hours call [phone]

## 2020-02-28 NOTE — Progress Notes (Signed)
This encounter was created in error - please disregard.

## 2020-02-28 NOTE — ED Notes (Signed)
Zosyn not yet verified by pharmacy.

## 2020-02-28 NOTE — ED Notes (Signed)
Pt sleeping with equal rise and chest fall. NAD. Currently awaiting pharmacy to verify meds

## 2020-02-28 NOTE — ED Notes (Signed)
Pt eating breakfast with wife at bedside.

## 2020-02-28 NOTE — H&P (Signed)
4        History and Physical    Jeffrey Ho TGG:[id number] DOB: 01/05/1935 DOA: 02/27/2020  PCP: [name], MD   Patient coming from:   [facility].  I have personally briefly reviewed patient's old medical records in [facility]  Chief Complaint: Constipation.  HPI: Jeffrey Ho is a 85 y.o. male with medical history significant of right eye blindness, carotid artery occlusion, hard of hearing, hyperlipidemia, hypertension who has been admitted twice in the past month for symptomatic anemia and then weakness with failure to thrive.  He was subsequently discharged to [facility] for rehabilitation and is being sent today to the ED due to constipation, abdominal pain and abdominal films showing possible ileus.  He denies fever, chills, but states he feels fatigued.  Stated his appetite is decreased, has been mildly nauseous, but denies emesis.  No melena or hematochezia.  No dysuria, frequency or hematuria.  No rhinorrhea, sore throat, dyspnea, chest pain, palpitations, dizziness, lower extremity edema, PND orthopnea.  No polyuria, polydipsia, polyphagia or blurred vision.  ED Course: Initial vital signs temperature 97.2 F, pulse 82, respiration 18, blood pressure 152/78 mmHg and O2 sat 100% on room air.  The patient received a 1000 mL NS bolus, a Fleet enema with disimpaction by EDP and 3.375 g of Zosyn IVPB.  CBC showed a white count 14.9, hemoglobin 10.0 g/dL and platelets 703.  Lipase was 19.  Sodium 127, potassium 3.0, chloride 89 and CO2 27 mmol/L.  Glucose 145, BUN 41, creatinine 1.56 mg/dL.  Albumin was 2.3 g/dL, AST 52 and ALT 50 units/L.  Imaging: CT abdomen/pelvis with contrast showing increased stated stool ball within the rectum with rectal distention consistent with feculent impaction and concerning for a stercoral proctitis.Marland Kitchen  Please see images and full radiology report for further detail.  Review of Systems: As per HPI otherwise all other systems  reviewed and are negative.  Past Medical History:  Diagnosis Date  . Blindness of right eye   . Carotid artery occlusion   . HOH (hard of hearing)   . Hyperlipidemia   . Hypertension     Past Surgical History:  Procedure Laterality Date  . BIOPSY  01/26/2020   Procedure: BIOPSY;  Surgeon: Corbin Ade, MD;  [facility];  Service: Endoscopy;;  gastric biopsies for H. Pylori  . CAROTID ENDARTERECTOMY     left CEA  06/24.2010  . CAROTID ENDARTERECTOMY     right CEA  02/20/2009  . COLONOSCOPY WITH PROPOFOL N/A 01/26/2020   Procedure: COLONOSCOPY WITH PROPOFOL;  Surgeon: Corbin Ade, MD;  Location[facility];  Service: Endoscopy;  Laterality: N/A;  . ESOPHAGOGASTRODUODENOSCOPY (EGD) WITH PROPOFOL N/A 01/26/2020   Procedure: ESOPHAGOGASTRODUODENOSCOPY (EGD) WITH PROPOFOL;  Surgeon: Corbin Ade, MD;  Location: [facility];  Service: Endoscopy;  Laterality: N/A;  . EYE SURGERY Left Nov. 2015   Cataract  . POLYPECTOMY  01/26/2020   Procedure: POLYPECTOMY;  Surgeon: Corbin Ade, MD;  Location: [facility];  Service: Endoscopy;;  Splenic Flexure polyp     Social History  reports that he quit smoking about 50 years ago. He has never used smokeless tobacco. He reports that he does not drink alcohol or use drugs.  No Known Allergies  Family History  Problem Relation Age of Onset  . Heart disease Father        Before age 29   Prior to Admission medications  Medication Sig Start Date End Date Taking? Authorizing Provider  aspirin 81 MG tablet Take 81 mg by mouth daily.   Yes [provider]  atorvastatin (LIPITOR) 20 MG tablet 1 tablet daily. 05/07/16  Yes [provider]  benzonatate (TESSALON) 100 MG capsule Take 100 mg by mouth 4 (four) times daily as needed for cough.  12/07/19  Yes [provider]  clotrimazole-betamethasone (LOTRISONE) cream Apply 1 application topically 2 (two) times daily as needed. fungal erythema of skin  folds. 01/23/20  Yes [provider]  feeding supplement, ENSURE ENLIVE, (ENSURE ENLIVE) LIQD Take 237 mLs by mouth 3 (three) times daily between meals. 02/19/20  Yes Leroy Sea, MD  ferrous sulfate 325 (65 FE) MG tablet Take 1 tablet (325 mg total) by mouth 2 (two) times daily with a meal. 02/19/20 02/18/21 Yes Leroy Sea, MD  folic acid (FOLVITE) 1 MG tablet Take 1 mg by mouth daily. 01/23/20  Yes [provider]  furosemide (LASIX) 40 MG tablet Take 1 tablet (40 mg total) by mouth daily. 02/19/20  Yes Leroy Sea, MD  lactulose (CHRONULAC) 10 GM/15ML solution Take 10 g by mouth 2 (two) times daily. Give 15 ml  02/26/20  Yes [provider]  magnesium citrate SOLN Take 1 Bottle by mouth once.   Yes [provider]  magnesium hydroxide (MILK OF MAGNESIA) 400 MG/5ML suspension Take 30 mLs by mouth daily as needed for mild constipation. 02/20/20  Yes [provider]  metoprolol tartrate (LOPRESSOR) 25 MG tablet Take 12.5 mg by mouth 2 (two) times daily with a meal.   Yes [provider]  mineral oil enema Place 1 enema rectally once.   Yes [provider]  niacin 500 MG tablet Take 500 mg by mouth daily with breakfast.   Yes [provider]  ondansetron (ZOFRAN) 4 MG tablet Take 4 mg by mouth every 8 (eight) hours as needed for nausea/vomiting. 01/27/20  Yes [provider]  polyethylene glycol (MIRALAX) 17 g packet Take 17 g by mouth daily. Patient taking differently: Take 17 g by mouth 2 (two) times daily.  02/19/20  Yes Leroy Sea, MD  potassium chloride (KLOR-CON) 10 MEQ tablet Take 20 mEq by mouth 2 (two) times daily. 02/27/20  Yes [provider]  predniSONE (DELTASONE) 10 MG tablet Take 30 mg by mouth daily with breakfast.   Yes [provider]  sennosides-docusate sodium (SENOKOT-S) 8.6-50 MG tablet Take 2 tablets by mouth 2 (two) times daily. 02/22/20  Yes [provider]     Physical Exam: Vitals:   02/27/20 2330 02/28/20 0000 02/28/20 0130 02/28/20 0230  BP: 137/73 (!) 144/69 138/67 128/72  Pulse: 80 80 77 79  Resp:      Temp:      TempSrc:      SpO2: 99% 100% 97% 98%  Weight:      Height:        Constitutional: NAD, calm, comfortable Eyes: PERRL, lids and conjunctivae normal ENMT: Mucous membranes are dry. Posterior pharynx clear of any exudate or lesions. Neck: normal, supple, no masses, no thyromegaly Respiratory: clear to auscultation bilaterally, no wheezing, no crackles. Normal respiratory effort. No accessory muscle use.  Cardiovascular: Regular rate and rhythm, no murmurs / rubs / gallops. No extremity edema. 2+ pedal pulses. No carotid bruits.  Abdomen: Nondistended.  BS positive.  Soft, mild left lower abdomen tenderness, no masses palpated. No hepatosplenomegaly. Bowel sounds positive.  Musculoskeletal: Generalized nonfocal weakness.  No clubbing / cyanosis. Good ROM, no contractures. Normal muscle tone.  Skin: no rashes, lesions, ulcers on very limited dermatological examination. Neurologic: CN 2-12 grossly intact. Sensation intact, DTR normal. Strength equal in all 4.  Psychiatric: Alert and oriented x 2, partially oriented to time.. Normal mood.   Labs on Admission: I have personally reviewed following labs and imaging studies  CBC: Recent Labs  Lab 02/26/20 0828 02/27/20 2204  WBC 19.2* 14.9*  NEUTROABS  --  11.9*  HGB 9.7* 10.0*  HCT 31.6* 32.4*  MCV 86.1 86.9  PLT 429* 413*    Basic Metabolic Panel: Recent Labs  Lab 02/27/20 0831 02/27/20 2204  NA 131* 127*  K 3.0* 3.0*  CL 92* 89*  CO2 26 27  GLUCOSE 117* 145*  BUN 38* 41*  CREATININE 1.44* 1.56*  CALCIUM 7.9* 8.0*  MG 2.6* 2.5*  PHOS  --  3.3    GFR: Estimated Creatinine Clearance: 40.1 mL/min (A) (by C-G formula based on SCr of 1.56 mg/dL (H)).  Liver Function Tests: Recent Labs  Lab 02/27/20 2204  AST 52*  ALT 50*  ALKPHOS 119  BILITOT 0.6    PROT 6.8  ALBUMIN 2.3*   Radiological Exams on Admission: CT ABDOMEN PELVIS W CONTRAST  Result Date: 02/27/2020 CLINICAL DATA:  Abdominal pain. Constipation. Possible ileus on abdominal x-ray earlier today. EXAM: CT ABDOMEN AND PELVIS WITH CONTRAST TECHNIQUE: Multidetector CT imaging of the abdomen and pelvis was performed using the standard protocol following bolus administration of intravenous contrast. CONTRAST:  56mL OMNIPAQUE IOHEXOL 300 MG/ML  SOLN COMPARISON:  CT 2 and half weeks ago 02/11/2020. FINDINGS: Lower chest: Improved pleural effusions from prior abdominal CT, small residual on the right. Heart is normal in size. Hepatobiliary: No focal hepatic lesion. Small gallstones without pericholecystic inflammation or biliary dilatation. Pancreas: No ductal dilatation or inflammation. Spleen: Normal in size without focal abnormality. Adrenals/Urinary Tract: Normal adrenal glands. No hydronephrosis or perinephric edema. Homogeneous renal enhancement with symmetric excretion on delayed phase imaging. Urinary bladder is physiologically distended without wall thickening. Stomach/Bowel: There is diffuse colonic distension. Liquid stool with air-fluid levels involves the cecum, ascending, and transverse colon with associated gaseous distension. More formed stool is seen in the descending and sigmoid colon. Sigmoid colon is redundant. Scattered sigmoid colonic diverticula without acute diverticulitis. Inspissated stool ball within the rectum with rectal distention of 8.2 cm. There is circumferential rectal wall thickening. No definite perirectal edema. Scattered foci of air adjacent to stool ball in the rectum may be adjacent to large stool ball versus small foci of pneumatosis. There is no evidence of perforation or extraluminal air. The appendix is partially obscured by small volume of fluid in the right pericolic gutter, no evidence of appendicitis. There is fecalization of scattered small bowel contents  without abnormal small bowel distention or small bowel obstruction. Stomach is unremarkable. Vascular/Lymphatic: Advanced aortic and branch atherosclerosis. Suspected stenosis at the origin of the celiac and SMA arteries due to calcified plaque. There is no mesenteric or portal venous gas. Portal vein is patent. No adenopathy. Reproductive: Prostate is unremarkable. Other: Small amount of free fluid in the right pericolic gutter. No free air or perforation. No intra-abdominal abscess. Minimal fat in the inguinal canals. Musculoskeletal: Diffuse degenerative change throughout the spine. There are no acute or suspicious osseous abnormalities. IMPRESSION: 1. Inspissated stool ball within the rectum with rectal distention of 8.2 cm, consistent with fecal impaction. There is circumferential rectal wall thickening which may represent stercoral proctitis, however  no definite perirectal edema. Scattered foci of air adjacent to the large stool ball in the rectum may be air in the bowel lumen versus small foci of pneumatosis. There is no evidence of perforation or extraluminal air. 2. Diffuse colonic distension, formed stool in the descending and sigmoid colon, with liquid stool and air proximally. No small bowel obstruction. 3. Small amount of free fluid in the right pericolic gutter is likely reactive. 4. Cholelithiasis without gallbladder inflammation. 5. Improved pleural effusions from prior abdominal CT, small residual on the right. 6. Advanced aortic atherosclerosis. 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 Aortic Atherosclerosis (ICD10-I70.0). Electronically Signed   By: Narda Rutherford M.D.   On: 02/27/2020 22:32    EKG: Independently reviewed.  Assessment/Plan Principal Problem:   Proctitis Much improved after disimpaction. Minimal pain now. Observation/telemetry. Continue gentle IV fluids. Continue Zosyn every 8 hours. Treat constipation.  Active  Problems:   Chronic constipation Feels better after manual disimpaction. Continue aggressive treatment to avoid impaction.    HTN (hypertension) Continue metoprolol 12.5 mg p.o. twice daily. Hold furosemide. Monitor blood pressure, heart rate, renal function electrolytes.    Hyperlipidemia Currently on atorvastatin 20 mg daily.    Hyponatremia Hold furosemide. Continue normal saline infusion. Follow-up sodium level.    Leukocytosis On prednisone. Monitor WBC.      DVT prophylaxis: SCDs. Code Status:   DNR. Family Communication:   Disposition Plan:   Patient is from:  [facility]  Anticipated DC to:  [facility].  Anticipated DC date:  02/28/2020 or 02/29/2020.  Anticipated DC barriers: Clinical improvement.  Consults called:   Admission status:  Observation/telemetry.  Severity of Illness:  Medium severity.  Bobette Mo MD [facility]  How to contact the [facility] Attending or Consulting provider 7A - 7P or covering provider during after hours 7P -7A, for this patient?   1. Check the care team in [facility] and look for a) attending/consulting [facility] provider listed and b) the [facility] team listed 2. Log into [email or web address] and use [facility] universal password to access. If you do not have the password, please contact the hospital operator. 3. Locate the [facility] provider you are looking for under [facility] and page to a number that you can be directly reached. 4. If you still have difficulty reaching the provider, please page the [facility] (Director on Call) for the Hospitalists listed on amion for assistance.  02/28/2020, 3:42 AM   This document was prepared using Dragon voice recognition software and may contain some unintended transcription errors.

## 2020-02-28 NOTE — Progress Notes (Signed)
Patient seen and examined. Admitted after midnight secondary to abdominal pain and severe constipation. Found with feces impaction, stercoral colitis and proctitis. No fever, no nausea, no vomiting currently. Reports appetite is poor. Hemodynamically stable. Please refer to H&P as written by Dr. Robb Matar for further info/details on admission.  Plan: -continue IV antibiotics -advance diet and assess for tolerance -patient is s/p disimpaction; will continue miralax and start linzess. -maintain adequate hydration -continue current antibiotics until proven oral tolerance.  Vassie Loll MD [phone]

## 2020-02-29 DIAGNOSIS — E876 Hypokalemia: Secondary | ICD-10-CM

## 2020-02-29 DIAGNOSIS — I10 Essential (primary) hypertension: Secondary | ICD-10-CM

## 2020-02-29 DIAGNOSIS — E785 Hyperlipidemia, unspecified: Secondary | ICD-10-CM

## 2020-02-29 DIAGNOSIS — E871 Hypo-osmolality and hyponatremia: Secondary | ICD-10-CM

## 2020-02-29 DIAGNOSIS — D72829 Elevated white blood cell count, unspecified: Secondary | ICD-10-CM

## 2020-02-29 LAB — CBC WITH DIFFERENTIAL/PLATELET
Abs Immature Granulocytes: 0.07 10*3/uL (ref 0.00–0.07)
Basophils Absolute: 0 10*3/uL (ref 0.0–0.1)
Basophils Relative: 0 %
Eosinophils Absolute: 0.1 10*3/uL (ref 0.0–0.5)
Eosinophils Relative: 1 %
HCT: 28.8 % — ABNORMAL LOW (ref 39.0–52.0)
Hemoglobin: 8.7 g/dL — ABNORMAL LOW (ref 13.0–17.0)
Immature Granulocytes: 0 %
Lymphocytes Relative: 14 %
Lymphs Abs: 2.1 10*3/uL (ref 0.7–4.0)
MCH: 26.7 pg (ref 26.0–34.0)
MCHC: 30.2 g/dL (ref 30.0–36.0)
MCV: 88.3 fL (ref 80.0–100.0)
Monocytes Absolute: 1.1 10*3/uL — ABNORMAL HIGH (ref 0.1–1.0)
Monocytes Relative: 7 %
Neutro Abs: 12.5 10*3/uL — ABNORMAL HIGH (ref 1.7–7.7)
Neutrophils Relative %: 78 %
Platelets: 359 10*3/uL (ref 150–400)
RBC: 3.26 MIL/uL — ABNORMAL LOW (ref 4.22–5.81)
RDW: 16.9 % — ABNORMAL HIGH (ref 11.5–15.5)
WBC: 15.9 10*3/uL — ABNORMAL HIGH (ref 4.0–10.5)
nRBC: 0 % (ref 0.0–0.2)

## 2020-02-29 LAB — BASIC METABOLIC PANEL
Anion gap: 10 (ref 5–15)
BUN: 38 mg/dL — ABNORMAL HIGH (ref 8–23)
CO2: 27 mmol/L (ref 22–32)
Calcium: 7.8 mg/dL — ABNORMAL LOW (ref 8.9–10.3)
Chloride: 97 mmol/L — ABNORMAL LOW (ref 98–111)
Creatinine, Ser: 1.47 mg/dL — ABNORMAL HIGH (ref 0.61–1.24)
GFR calc Af Amer: 51 mL/min — ABNORMAL LOW (ref >60)
GFR calc non Af Amer: 44 mL/min — ABNORMAL LOW (ref >60)
Glucose, Bld: 103 mg/dL — ABNORMAL HIGH (ref 70–99)
Potassium: 3.4 mmol/L — ABNORMAL LOW (ref 3.5–5.1)
Sodium: 134 mmol/L — ABNORMAL LOW (ref 135–145)

## 2020-02-29 LAB — MRSA PCR SCREENING: MRSA by PCR: NEGATIVE

## 2020-02-29 MED ORDER — POTASSIUM CHLORIDE CRYS ER 20 MEQ PO TBCR
20.0000 meq | EXTENDED_RELEASE_TABLET | Freq: Once | ORAL | Status: AC
  Administered 2020-02-29: 20 meq via ORAL
  Filled 2020-02-29: qty 1

## 2020-02-29 NOTE — TOC Transition Note (Signed)
Transition of Care [facility]) - CM/SW Discharge Note   Patient Details  Name: JAYKE CAUL MRN: [id number] Date of Birth: 1935/09/29  [facility] CM/SW Contact:  Annice Needy, LCSW Phone Number: 02/29/2020, 2:41 PM   Clinical Narrative:    Bonita Quin with [facility] notified of discharge. Patient has had [facility] earlier this month prior to SNF. TOC signing off.    Final next level of care: Home w Home Health Services Barriers to Discharge: No Barriers Identified   Patient Goals and CMS Choice Patient states their goals for this hospitalization and ongoing recovery are:: To return home with [facility] services   Choice offered to / list presented to : Spouse  Discharge Placement                       Discharge Plan and Services     Post Acute Care Choice: Home Health                    HH Arranged: RN, PT, Social Work, Nurse's Aide HH Agency: [facility] Date HH Agency Contacted: 02/29/20 Time HH Agency Contacted: [phone] Representative spoke with at [facility] Agency: linda lothian  Social Determinants of Health (SDOH) Interventions     Readmission Risk Interventions No flowsheet data found.

## 2020-02-29 NOTE — Progress Notes (Signed)
PROGRESS NOTE    Jeffrey Ho  XBM:[id number] DOB: Mar 14, 1935 DOA: 02/27/2020 PCP: [name], MD   Chief Complaint  Patient presents with  . Abdominal Pain    Brief Narrative:  As per H&P written by Dr. Robb Matar on 02/28/2020 85 y.o. male with medical history significant of right eye blindness, carotid artery occlusion, hard of hearing, hyperlipidemia, hypertension who has been admitted twice in the past month for symptomatic anemia and then weakness with failure to thrive.  He was subsequently discharged to [facility] for rehabilitation and is being sent today to the ED due to constipation, abdominal pain and abdominal films showing possible ileus.  He denies fever, chills, but states he feels fatigued.  Stated his appetite is decreased, has been mildly nauseous, but denies emesis.  No melena or hematochezia.  No dysuria, frequency or hematuria.  No rhinorrhea, sore throat, dyspnea, chest pain, palpitations, dizziness, lower extremity edema, PND orthopnea.  No polyuria, polydipsia, polyphagia or blurred vision.  ED Course: Initial vital signs temperature 97.2 F, pulse 82, respiration 18, blood pressure 152/78 mmHg and O2 sat 100% on room air.  The patient received a 1000 mL NS bolus, a Fleet enema with disimpaction by EDP and 3.375 g of Zosyn IVPB.  CBC showed a white count 14.9, hemoglobin 10.0 g/dL and platelets 027.  Lipase was 19.  Sodium 127, potassium 3.0, chloride 89 and CO2 27 mmol/L.  Glucose 145, BUN 41, creatinine 1.56 mg/dL.  Albumin was 2.3 g/dL, AST 52 and ALT 50 units/L.  Imaging: CT abdomen/pelvis with contrast showing increased stated stool ball within the rectum with rectal distention consistent with feculent impaction and concerning for a stercoral proctitis.Marland Kitchen  Please see images and full radiology report for further detail.  Assessment & Plan: 1-proctitis stercoral colitis -Status post disimpaction in the ED -Good response to the use of MiraLAX and  Linzess -Continue advancing Currently oral intake -Continue IV antibiotics until truly reliable to take oral medications. -Continue IV fluids (decreasing rate), continue supportive care and follow clinical response.  2-HTN (hypertension) -Stable and well-controlled -Continue the use of metoprolol -Continue holding diuretics currently.  3-chronic constipation -Continue treatment with Linzess. -Advised to maintain adequate hydration  4-hyperlipidemia -Continue statins.  5-hyponatremia -In the setting of diuretics and poor oral intake -Continue adjusted rate of IV fluids -Electrolytes improved -Follow BMP in a.m.  6-leukocytosis -In the setting of steroids usage and acute infection -Continue antibiotics -WBCs trending down.   DVT prophylaxis: SCDs Code Status: DNR Family Communication: Wife at bedside. Disposition:   Status is: Inpatient  Dispo: The patient is from: Skilled nursing facility.              Anticipated d/c is to: Home with home health services              Anticipated d/c date is: 5/28--5/29; based on further clinical improvement.              Patient currently No medically stable for discharge in the setting of unreliable full p.o. capability; also still regulating bowel movement and assessing capacity for oral antibiotics.  By his physical deconditioning he has declined nursing home placement and will be looking to go home with home health services when medically stable.  Hopefully home in the next 24-48 hours.        Consultants:   None  Procedures: See below for x-ray reports.  Antimicrobials:  Zosyn  Subjective: Feeling weak and deconditioned.  Reports some improvement in oral  intake.  No abdominal pain, no nausea vomiting.  Still with abdominal distention and feeling obstipated.  Multiple bowel movements overnight.  Objective: Vitals:   02/29/20 0012 02/29/20 0400 02/29/20 0800 02/29/20 1608  BP: (!) 144/79 (!) 141/63 137/62 (!) 147/94   Pulse: 84 85 79 80  Resp: 16 16 16 18   Temp: (!) 97.4 F (36.3 C) (!) 97.4 F (36.3 C) 97.9 F (36.6 C) 98 F (36.7 C)  TempSrc: Oral Oral Oral Oral  SpO2: 98% 100%  98%  Weight:      Height:        Intake/Output Summary (Last 24 hours) at 02/29/2020 1740 Last data filed at 02/29/2020 0600 Gross per 24 hour  Intake 1782.15 ml  Output 450 ml  Net 1332.15 ml   Filed Weights   02/27/20 2042 02/28/20 1900  Weight: 91.8 kg 91.4 kg    Examination: General exam: Appears calm and comfortable; reports feeling better.  Multiple bowel movements throughout the night and this morning.  Some improvement in his oral intake (especially during breakfast); no chest pain, no nausea, no vomiting, no shortness of breath.  Patient is afebrile. Respiratory system: Clear to auscultation. Respiratory effort normal. Cardiovascular system: S1 & S2 heard, RRR. No JVD, murmurs, rubs, gallops or clicks. No pedal edema. Gastrointestinal system: Abdomen is mildly distended, soft and nontender. No organomegaly or masses felt. Normal bowel sounds heard. Central nervous system: Alert and oriented. No focal neurological deficits. Extremities: Symmetric 4/5 bilaterally in the setting of poor effort and deconditioning. Skin: No petechiae; positive stage I in between gluteal pressure injury/irritation. (first time seen it by myself) Psychiatry: Mood & affect appropriate.     Data Reviewed: I have personally reviewed following labs and imaging studies  CBC: Recent Labs  Lab 02/26/20 0828 02/27/20 2204 02/28/20 0430 02/29/20 0731  WBC 19.2* 14.9* 15.3* 15.9*  NEUTROABS  --  11.9* 12.4* 12.5*  HGB 9.7* 10.0* 9.2* 8.7*  HCT 31.6* 32.4* 30.5* 28.8*  MCV 86.1 86.9 87.1 88.3  PLT 429* 413* 399 703    Basic Metabolic Panel: Recent Labs  Lab 02/27/20 0831 02/27/20 2204 02/28/20 0430 02/29/20 0731  NA 131* 127* 130* 134*  K 3.0* 3.0* 2.9* 3.4*  CL 92* 89* 93* 97*  CO2 26 27 27 27   GLUCOSE 117* 145*  110* 103*  BUN 38* 41* 39* 38*  CREATININE 1.44* 1.56* 1.56* 1.47*  CALCIUM 7.9* 8.0* 7.7* 7.8*  MG 2.6* 2.5*  --   --   PHOS  --  3.3  --   --     GFR: Estimated Creatinine Clearance: 42.5 mL/min (A) (by C-G formula based on SCr of 1.47 mg/dL (H)).  Liver Function Tests: Recent Labs  Lab 02/27/20 2204 02/28/20 0430  AST 52* 40  ALT 50* 39  ALKPHOS 119 94  BILITOT 0.6 0.6  PROT 6.8 5.9*  ALBUMIN 2.3* 2.1*    CBG: No results for input(s): GLUCAP in the last 168 hours.   Recent Results (from the past 240 hour(s))  MRSA PCR Screening     Status: None   Collection Time: 02/29/20  5:47 AM   Specimen: Nasopharyngeal  Result Value Ref Range Status   MRSA by PCR NEGATIVE NEGATIVE Final    Comment:        The GeneXpert MRSA Assay (FDA approved for NASAL specimens only), is one component of a comprehensive MRSA colonization surveillance program. It is not intended to diagnose MRSA infection nor to guide or monitor treatment  for MRSA infections. Performed at [facility], [address], [place], [place] [address]       Radiology Studies: CT ABDOMEN PELVIS W CONTRAST  Result Date: 02/27/2020 CLINICAL DATA:  Abdominal pain. Constipation. Possible ileus on abdominal x-ray earlier today. EXAM: CT ABDOMEN AND PELVIS WITH CONTRAST TECHNIQUE: Multidetector CT imaging of the abdomen and pelvis was performed using the standard protocol following bolus administration of intravenous contrast. CONTRAST:  22mL OMNIPAQUE IOHEXOL 300 MG/ML  SOLN COMPARISON:  CT 2 and half weeks ago 02/11/2020. FINDINGS: Lower chest: Improved pleural effusions from prior abdominal CT, small residual on the right. Heart is normal in size. Hepatobiliary: No focal hepatic lesion. Small gallstones without pericholecystic inflammation or biliary dilatation. Pancreas: No ductal dilatation or inflammation. Spleen: Normal in size without focal abnormality. Adrenals/Urinary Tract: Normal adrenal glands. No  hydronephrosis or perinephric edema. Homogeneous renal enhancement with symmetric excretion on delayed phase imaging. Urinary bladder is physiologically distended without wall thickening. Stomach/Bowel: There is diffuse colonic distension. Liquid stool with air-fluid levels involves the cecum, ascending, and transverse colon with associated gaseous distension. More formed stool is seen in the descending and sigmoid colon. Sigmoid colon is redundant. Scattered sigmoid colonic diverticula without acute diverticulitis. Inspissated stool ball within the rectum with rectal distention of 8.2 cm. There is circumferential rectal wall thickening. No definite perirectal edema. Scattered foci of air adjacent to stool ball in the rectum may be adjacent to large stool ball versus small foci of pneumatosis. There is no evidence of perforation or extraluminal air. The appendix is partially obscured by small volume of fluid in the right pericolic gutter, no evidence of appendicitis. There is fecalization of scattered small bowel contents without abnormal small bowel distention or small bowel obstruction. Stomach is unremarkable. Vascular/Lymphatic: Advanced aortic and branch atherosclerosis. Suspected stenosis at the origin of the celiac and SMA arteries due to calcified plaque. There is no mesenteric or portal venous gas. Portal vein is patent. No adenopathy. Reproductive: Prostate is unremarkable. Other: Small amount of free fluid in the right pericolic gutter. No free air or perforation. No intra-abdominal abscess. Minimal fat in the inguinal canals. Musculoskeletal: Diffuse degenerative change throughout the spine. There are no acute or suspicious osseous abnormalities. IMPRESSION: 1. Inspissated stool ball within the rectum with rectal distention of 8.2 cm, consistent with fecal impaction. There is circumferential rectal wall thickening which may represent stercoral proctitis, however no definite perirectal edema. Scattered  foci of air adjacent to the large stool ball in the rectum may be air in the bowel lumen versus small foci of pneumatosis. There is no evidence of perforation or extraluminal air. 2. Diffuse colonic distension, formed stool in the descending and sigmoid colon, with liquid stool and air proximally. No small bowel obstruction. 3. Small amount of free fluid in the right pericolic gutter is likely reactive. 4. Cholelithiasis without gallbladder inflammation. 5. Improved pleural effusions from prior abdominal CT, small residual on the right. 6. Advanced aortic atherosclerosis. 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 Aortic Atherosclerosis (ICD10-I70.0). Electronically Signed   By: Narda Rutherford M.D.   On: 02/27/2020 22:32    Scheduled Meds: . aspirin  81 mg Oral Daily  . atorvastatin  20 mg Oral Daily  . clotrimazole   Topical BID  . feeding supplement (ENSURE ENLIVE)  237 mL Oral TID BM  . hydrocortisone-pramoxine  1 applicator Rectal BID  . lactulose  10 g Oral BID  . linaclotide  145  mcg Oral QAC breakfast  . metoprolol tartrate  12.5 mg Oral BID WC  . niacin  500 mg Oral Q breakfast  . predniSONE  30 mg Oral Q breakfast  . sennosides-docusate sodium  2 tablet Oral BID   Continuous Infusions: . 0.9 % NaCl with KCl 40 mEq / L 75 mL/hr (02/29/20 0900)  . piperacillin-tazobactam 3.375 g (02/29/20 1040)     LOS: 1 day    Time spent: 30 minutes.    Vassie Loll, MD [facility]   To contact the attending provider between 7A-7P or the covering provider during after hours 7P-7A, please log into the web site [email or web address] and access using universal  password for that web site. If you do not have the password, please call the hospital operator.  02/29/2020, 5:40 PM

## 2020-02-29 NOTE — TOC Initial Note (Signed)
Late entry for 02/28/20 Transition of Care [facility]) - Initial/Assessment Note    Patient Details  Name: Jeffrey Ho MRN: [id number] Date of Birth: 04/07/1935  Transition of Care [facility]) CM/SW Contact:    Ihor Gully, LCSW Phone Number: 02/29/2020, 2:40 PM  Clinical Narrative:                 Patient does not wish to return to [facility]. Wants to return home with [facility] services. Wife [name] agreeable for patient to return home with [facility] services.   Expected Discharge Plan: [place] Barriers to Discharge: No Barriers Identified   Patient Goals and CMS Choice Patient states their goals for this hospitalization and ongoing recovery are:: To return home with [facility] services   Choice offered to / list presented to : Spouse  Expected Discharge Plan and Services Expected Discharge Plan: [place] Acute Care Choice: [place] arrangements for the past 2 months: Single Family Home                           HH Arranged: RN, PT, Social Work, Nurse's Aide [place] Agency: [place] ([place]) Date HH Agency Contacted: 02/29/20 Time [place]: 63 Representative spoke with at [place]: [place]  Prior Living Arrangements/Services Living arrangements for the past 2 months: [place] with:: Spouse Patient language and need for interpreter reviewed:: Yes Do you feel safe going back to the place where you live?: Yes      Need for Family Participation in Patient Care: Yes (Comment) Care giver support system in place?: Yes (comment)   Criminal Activity/Legal Involvement Pertinent to Current Situation/Hospitalization: No - Comment as needed  Activities of Daily Living Home Assistive Devices/Equipment: Wheelchair ADL Screening (condition at time of admission) Patient's cognitive ability adequate to safely complete daily activities?: Yes Is the patient deaf or have difficulty hearing?: Yes Does the  patient have difficulty seeing, even when wearing glasses/contacts?: No Does the patient have difficulty concentrating, remembering, or making decisions?: No Patient able to express need for assistance with ADLs?: Yes Does the patient have difficulty dressing or bathing?: Yes Independently performs ADLs?: No Communication: Independent Dressing (OT): Needs assistance Is this a change from baseline?: Pre-admission baseline Grooming: Needs assistance Is this a change from baseline?: Pre-admission baseline Feeding: Independent Bathing: Needs assistance Is this a change from baseline?: Pre-admission baseline Toileting: Needs assistance Is this a change from baseline?: Pre-admission baseline In/Out Bed: Dependent Is this a change from baseline?: Pre-admission baseline Walks in Home: Dependent Is this a change from baseline?: Pre-admission baseline Does the patient have difficulty walking or climbing stairs?: Yes Weakness of Legs: Both Weakness of Arms/Hands: Both  Permission Sought/Granted Permission sought to share information with : Family Supports Permission granted to share information with : Yes, Release of Information Signed  Share Information with NAME: [name]     Permission granted to share info w Relationship: wife     Emotional Assessment Appearance:: Appears stated age   Affect (typically observed): Appropriate Orientation: : Oriented to Self, Oriented to Place, Oriented to  Time, Oriented to Situation Alcohol / Substance Use: Not Applicable Psych Involvement: No (comment)  Admission diagnosis:  Hypokalemia [E87.6] Colitis [K52.9] Hyponatremia [E87.1] Proctitis [K62.89] Constipation, unspecified constipation type [K59.00] Patient Active Problem List   Diagnosis Date Noted  . Proctitis 02/28/2020  . Chronic constipation 02/23/2020  . PMR (  polymyalgia rheumatica) (HCC) 02/23/2020  . Bilateral lower extremity edema 02/23/2020  . AKI (acute kidney injury) (HCC)  02/21/2020  . Failure to thrive in adult 02/11/2020  . Tachypnea 02/11/2020  . Physical deconditioning 02/11/2020  . Weakness 01/24/2020  . Symptomatic anemia 01/23/2020  . Prolonged QT interval 01/23/2020  . Hyponatremia 01/23/2020  . Hypoalbuminemia due to protein-calorie malnutrition (HCC) 01/23/2020  . Leukocytosis 01/23/2020  . HTN (hypertension) 12/31/2019  . Carotid artery disease without cerebral infarction (HCC) 12/31/2019  . Cardiac ischemia 12/31/2019  . CAP (community acquired pneumonia) 12/31/2019  . Hyperlipidemia 12/31/2019  . Heme positive stool 12/31/2019  . Occlusion and stenosis of carotid artery without mention of cerebral infarction 08/22/2012   PCP:  [name], MD Pharmacy:   [facility] [address] [place] - [address] [place] [address] [place] [address] Phone: [phone] Fax: [phone]     Social Determinants of Health (SDOH) Interventions    Readmission Risk Interventions No flowsheet data found.

## 2020-03-01 ENCOUNTER — Inpatient Hospital Stay
Admission: RE | Admit: 2020-03-01 | Discharge: 2020-03-01 | Disposition: A | Discharge status: Home / Self Care | Payer: Medicare Other | Source: Ambulatory Visit | Attending: Internal Medicine | Admitting: Internal Medicine

## 2020-03-01 DIAGNOSIS — E876 Hypokalemia: Secondary | ICD-10-CM

## 2020-03-01 DIAGNOSIS — K529 Noninfective gastroenteritis and colitis, unspecified: Secondary | ICD-10-CM

## 2020-03-01 LAB — BASIC METABOLIC PANEL
Anion gap: 11 (ref 5–15)
BUN: 30 mg/dL — ABNORMAL HIGH (ref 8–23)
CO2: 24 mmol/L (ref 22–32)
Calcium: 7.7 mg/dL — ABNORMAL LOW (ref 8.9–10.3)
Chloride: 99 mmol/L (ref 98–111)
Creatinine, Ser: 1.39 mg/dL — ABNORMAL HIGH (ref 0.61–1.24)
GFR calc Af Amer: 54 mL/min — ABNORMAL LOW (ref >60)
GFR calc non Af Amer: 47 mL/min — ABNORMAL LOW (ref >60)
Glucose, Bld: 105 mg/dL — ABNORMAL HIGH (ref 70–99)
Potassium: 3.1 mmol/L — ABNORMAL LOW (ref 3.5–5.1)
Sodium: 134 mmol/L — ABNORMAL LOW (ref 135–145)

## 2020-03-01 LAB — CBC
HCT: 29.9 % — ABNORMAL LOW (ref 39.0–52.0)
Hemoglobin: 8.9 g/dL — ABNORMAL LOW (ref 13.0–17.0)
MCH: 26.7 pg (ref 26.0–34.0)
MCHC: 29.8 g/dL — ABNORMAL LOW (ref 30.0–36.0)
MCV: 89.8 fL (ref 80.0–100.0)
Platelets: 333 10*3/uL (ref 150–400)
RBC: 3.33 MIL/uL — ABNORMAL LOW (ref 4.22–5.81)
RDW: 17 % — ABNORMAL HIGH (ref 11.5–15.5)
WBC: 14.3 10*3/uL — ABNORMAL HIGH (ref 4.0–10.5)
nRBC: 0 % (ref 0.0–0.2)

## 2020-03-01 MED ORDER — SACCHAROMYCES BOULARDII 250 MG PO CAPS: 250.0000 mg | ORAL_CAPSULE | Freq: Two times a day (BID) | ORAL | 0 refills | Status: AC

## 2020-03-01 MED ORDER — FUROSEMIDE 40 MG PO TABS: 40.0000 mg | ORAL_TABLET | Freq: Every day | ORAL | Status: DC | PRN

## 2020-03-01 MED ORDER — AMOXICILLIN-POT CLAVULANATE 500-125 MG PO TABS: 1.0000 | ORAL_TABLET | Freq: Two times a day (BID) | ORAL | 0 refills | Status: AC

## 2020-03-01 MED ORDER — AMOXICILLIN-POT CLAVULANATE 500-125 MG PO TABS
1.0000 | ORAL_TABLET | Freq: Two times a day (BID) | ORAL | Status: DC
  Administered 2020-03-01 (×2): 500 mg via ORAL
  Filled 2020-03-01 (×2): qty 1

## 2020-03-01 MED ORDER — SACCHAROMYCES BOULARDII 250 MG PO CAPS
250.0000 mg | ORAL_CAPSULE | Freq: Two times a day (BID) | ORAL | Status: DC
  Administered 2020-03-01 (×2): 250 mg via ORAL
  Filled 2020-03-01 (×2): qty 1

## 2020-03-01 MED ORDER — LINACLOTIDE 145 MCG PO CAPS: 145.0000 ug | ORAL_CAPSULE | ORAL | 0 refills | Status: DC

## 2020-03-01 NOTE — TOC Transition Note (Addendum)
Transition of Care [facility]) - CM/SW Discharge Note   Patient Details  Name: Jeffrey Ho MRN: [id number] Date of Birth: April 05, 1935  Transition of Care [facility]) CM/SW Contact:  Annice Needy, LCSW Phone Number: 03/01/2020, 4:18 PM   Clinical Narrative:    Bonita Quin with [facility] advised of discharge. Orders for RN,PT, Swkr, aide placed.   Final next level of care: Home w Home Health Services Barriers to Discharge: No Barriers Identified   Patient Goals and CMS Choice Patient states their goals for this hospitalization and ongoing recovery are:: To return home with [facility] services   Choice offered to / list presented to : Spouse  Discharge Placement                       Discharge Plan and Services     Post Acute Care Choice: Home Health                    HH Arranged: RN, PT, Social Work, Nurse's Aide HH Agency: [facility] Date HH Agency Contacted: 02/29/20 Time HH Agency Contacted: [phone] Representative spoke with at [facility] Agency: [place]  Social Determinants of Health (SDOH) Interventions     Readmission Risk Interventions No flowsheet data found.

## 2020-03-01 NOTE — Discharge Summary (Signed)
Physician Discharge Summary  Jeffrey Ho UJW:[id number] DOB: 02/12/1935 DOA: 02/27/2020  PCP: [name], MD  Admit date: 02/27/2020 Discharge date: 03/01/2020  Time spent: 35 minutes  Recommendations for Outpatient Follow-up:  1. Repeat CBC to follow WBCs trend 2. -Repeat basic metabolic panel to follow trends and renal function 3. Reassess resolution of patient's symptoms and further clinical improvement.   Discharge Diagnoses:  Principal Problem:   Proctitis Active Problems:   HTN (hypertension)   Hyperlipidemia   Hyponatremia   Leukocytosis   Chronic constipation   Colitis   Hypokalemia   Discharge Condition: Stable and improved.  Discharged home with instruction to follow-up with PCP in 10 days.  Home health services has been requested at time of discharge to provide home health RN, home health PT, home health aide and home health social worker.  CODE STATUS: DNR  Diet recommendation: Heart healthy diet.  Filed Weights   02/27/20 2042 02/28/20 1900  Weight: 91.8 kg 91.4 kg    History of present illness:  As per H&P written by Dr. Robb Matar on 02/28/2020 85 y.o.malewith medical history significant ofright eye blindness, carotid artery occlusion, hard of hearing, hyperlipidemia, hypertension who has been admitted twice in the past month for symptomatic anemia and then weakness with failure to thrive. He was subsequently discharged to [facility] for rehabilitation and is being sent today to the ED due to constipation, abdominal painand abdominal films showing possible ileus.He denies fever, chills, but states he feels fatigued. Stated his appetite is decreased, has been mildly nauseous, but denies emesis. No melena or hematochezia. No dysuria, frequency or hematuria. No rhinorrhea, sore throat, dyspnea, chest pain, palpitations, dizziness, lower extremity edema, PND orthopnea. No polyuria, polydipsia, polyphagia or blurred vision.  ED Course:Initial  vital signs temperature 97.2 F, pulse 82, respiration 18, blood pressure 152/78 mmHg and O2 sat 100% on room air. The patient received a 1000 mL NS bolus, a Fleet enema with disimpaction by EDP and 3.375 g of Zosyn IVPB.  CBC showed a white count 14.9, hemoglobin 10.0 g/dL and platelets 562. Lipase was 19. Sodium 127, potassium 3.0, chloride 89 and CO2 27 mmol/L. Glucose 145, BUN 41, creatinine 1.56 mg/dL. Albumin was 2.3 g/dL, AST 52 and ALT 50 units/L.  Imaging: CT abdomen/pelvis with contrast showing increased stated stool ball within the rectum with rectal distention consistent with feculent impaction and concerning for a stercoral proctitis.Marland Kitchen Please see images and full radiology report for further detail.  Hospital Course:  1-proctitis stercoral colitis -Status post disimpaction in the ED -Good response to the use of MiraLAX and Linzess -Tolerating diet and oral hydration prior to discharge -Antibiotic transition to oral Augmentin to complete therapy. -No fever, WBC trending down and no abdominal pain. -Reassess complete symptom resolution and as mentioned below blood work for WBCs, electrolytes and renal function assessment.  2-HTN (hypertension) -Stable and well-controlled -Continue the use of metoprolol -Continue holding diuretics currently. -Patient advised to maintain close monitoring of sodium intake.  3-chronic constipation -Continue treatment with Linzess every other day. -Advised to maintain adequate hydration and nutrition.  4-hyperlipidemia -Continue statins.  5-hyponatremia/hypokalemia -In the setting of diuretics and poor oral intake -Significantly improved after fluid resuscitation and electrolyte repletion -Advised to maintain adequate oral hydration and nutrition at discharge. -Diuretics has been transitioned to as needed only. -Follow basic metabolic panel follow-up visit to reassess electrolytes trend.  6-leukocytosis -In the setting of steroids  usage and acute infection -Continue antibiotics as mentioned above. -WBCs trending down  appropriately. -No fever and tolerating PO's at discharge. -Repeat CBC to follow complete resolution of elevated WBCs.  7-physical deconditioning -Home health services has been requested as per discussion with patient and family member at bedside. -SNF declined.  8-DNR -Wishes respected -patient will continue treating what is treatable but expressed declined invasive services.  Procedures: See below for x-ray reports.  Consultations:  None  Discharge Exam: Vitals:   03/01/20 0510 03/01/20 1414  BP: (!) 142/69 (!) 146/74  Pulse: 80 78  Resp: 16 18  Temp: 98.2 F (36.8 C) 98.1 F (36.7 C)  SpO2: 97% 100%    General: Afebrile, no chest pain, no nausea, no vomiting.  Reports improvement in his oral intake and feeling ready to go home.  Still weak, deconditioned, chronically ill and very frail.  Once again expressed declined decision for skilled nursing facility care and looking to go home with home health services. Cardiovascular: S1-S2, no rubs, no gallops, no murmurs.  No JVD. Respiratory: Clear to auscultation bilaterally.  Abdomen: Soft, nontender, positive bowel sounds.  Distention resolved prior to discharge. -Extremities: No cyanosis or clubbing, no edema.  Discharge Instructions   Discharge Instructions    Diet - low sodium heart healthy   Complete by: As directed    Discharge instructions   Complete by: As directed    Take medications as prescribed Maintain adequate hydration Arrange follow-up with PCP in 10 days Follow heart healthy diet Check your weight on daily basis.     Allergies as of 03/01/2020   No Known Allergies     Medication List    STOP taking these medications   magnesium hydroxide 400 MG/5ML suspension Commonly known as: MILK OF MAGNESIA   polyethylene glycol 17 g packet Commonly known as: MIRALAX / GLYCOLAX   potassium chloride 10 MEQ  tablet Commonly known as: KLOR-CON   sennosides-docusate sodium 8.6-50 MG tablet Commonly known as: SENOKOT-S     TAKE these medications   amoxicillin-clavulanate 500-125 MG tablet Commonly known as: AUGMENTIN Take 1 tablet (500 mg total) by mouth every 12 (twelve) hours for 7 days.   aspirin 81 MG tablet Take 81 mg by mouth daily.   atorvastatin 20 MG tablet Commonly known as: LIPITOR 1 tablet daily.   benzonatate 100 MG capsule Commonly known as: TESSALON Take 100 mg by mouth 4 (four) times daily as needed for cough.   clotrimazole-betamethasone cream Commonly known as: LOTRISONE Apply 1 application topically 2 (two) times daily as needed. fungal erythema of skin folds.   feeding supplement (ENSURE ENLIVE) Liqd Take 237 mLs by mouth 3 (three) times daily between meals.   ferrous sulfate 325 (65 FE) MG tablet Take 1 tablet (325 mg total) by mouth 2 (two) times daily with a meal.   fish oil-omega-3 fatty acids 1000 MG capsule Take 1 g by mouth daily.   folic acid 1 MG tablet Commonly known as: FOLVITE Take 1 mg by mouth daily.   furosemide 40 MG tablet Commonly known as: LASIX Take 1 tablet (40 mg total) by mouth daily as needed for fluid or edema. What changed:   when to take this  reasons to take this   lactulose 10 GM/15ML solution Commonly known as: CHRONULAC Take 10 g by mouth 2 (two) times daily. Give 15 ml   linaclotide 145 MCG Caps capsule Commonly known as: Linzess Take 1 capsule (145 mcg total) by mouth every other day. (Adjust as needed to prevent excessive diarrhea)   metoprolol tartrate 25 MG  tablet Commonly known as: LOPRESSOR Take 12.5 mg by mouth 2 (two) times daily with a meal.   niacin 500 MG tablet Take 500 mg by mouth daily with breakfast.   NON FORMULARY Diet: _____ Regular, ___x___ NAS, _______Consistent Carbohydrate, _______NPO _____Other   ondansetron 4 MG tablet Commonly known as: ZOFRAN Take 4 mg by mouth every 8  (eight) hours as needed for nausea/vomiting.   predniSONE 10 MG tablet Commonly known as: DELTASONE Take 30 mg by mouth daily with breakfast.   saccharomyces boulardii 250 MG capsule Commonly known as: FLORASTOR Take 1 capsule (250 mg total) by mouth 2 (two) times daily for 20 days.      No Known Allergies Follow-up Information    Redmond School, MD. Schedule an appointment as soon as possible for a visit in 10 day(s).   Specialty: Internal Medicine Contact information: [address] [place] [address] [phone]           The results of significant diagnostics from this hospitalization (including imaging, microbiology, ancillary and laboratory) are listed below for reference.    Significant Diagnostic Studies: DG Chest 2 View  Result Date: 02/11/2020 CLINICAL DATA:  Shortness of breath EXAM: CHEST - 2 VIEW COMPARISON:  January 23, 2020 FINDINGS: There is either scarring in the lateral right base or small right pleural effusion. There is apparent scarring in the right mid lung as well. Lungs elsewhere are clear. Heart size and pulmonary vascularity are normal. There is aortic atherosclerosis. No adenopathy. There is degenerative change in the thoracic spine. IMPRESSION: Scarring right mid lung. Question scarring lateral right base versus small right pleural effusion. Lungs elsewhere clear. Stable cardiac silhouette.  Aortic Atherosclerosis (ICD10-I70.0). Electronically Signed   By: Lowella Grip III M.D.   On: 02/11/2020 11:40   CT CHEST WO CONTRAST  Result Date: 02/12/2020 CLINICAL DATA:  Cancer of unknown primary. EXAM: CT CHEST WITHOUT CONTRAST TECHNIQUE: Multidetector CT imaging of the chest was performed following the standard protocol without IV contrast. COMPARISON:  January 23, 2020. FINDINGS: Cardiovascular: Atherosclerosis of thoracic aorta is noted without aneurysm formation. Normal cardiac size. No pericardial effusion. Coronary artery calcifications are  noted. Mediastinum/Nodes: No enlarged mediastinal or axillary lymph nodes. Thyroid gland, trachea, and esophagus demonstrate no significant findings. Lungs/Pleura: No pneumothorax is noted. Minimal right pleural effusion is noted. Minimal bilateral posterior basilar subsegmental atelectasis is noted. Stable left apical scarring is noted. Stable right middle lobe scarring is noted. Stable right posterior upper lobe opacity is noted concerning for pneumonia or possibly scarring. Upper Abdomen: No acute abnormality. Musculoskeletal: No chest wall mass or suspicious bone lesions identified. IMPRESSION: 1. Coronary artery calcifications are noted suggesting coronary artery disease. 2. Minimal right pleural effusion is noted. 3. Stable right posterior upper lobe opacity is noted concerning for pneumonia or possibly scarring. Stable right middle lobe scarring is noted. 4. Stable left apical scarring is noted. 5. Minimal bilateral posterior basilar subsegmental atelectasis is noted. Aortic Atherosclerosis (ICD10-I70.0). Electronically Signed   By: Marijo Conception M.D.   On: 02/12/2020 11:46   MR BRAIN WO CONTRAST  Result Date: 02/11/2020 CLINICAL DATA:  Subacute neuro deficit EXAM: MRI HEAD WITHOUT CONTRAST TECHNIQUE: Multiplanar, multiecho pulse sequences of the brain and surrounding structures were obtained without intravenous contrast. COMPARISON:  02/17/2009 FINDINGS: Brain: No acute infarction, hemorrhage, hydrocephalus, extra-axial collection or mass lesion. Cerebral volume loss and ventriculomegaly that is progressed from 2010. No significant ischemic changes. Vascular: Normal flow voids Skull and upper cervical spine: Normal marrow  signal Sinuses/Orbits: Marked atrophy of the right optic nerve compared to the left, see coronal reformats. There is history of blindness on the right per the chart. Bilateral mastoid opacification. Left sphenoid sinus opacification. IMPRESSION: 1. No acute or reversible finding. 2.  Brain atrophy that has progressed from 2010. 3. Left sphenoid sinus and bilateral mastoid opacification with negative nasopharynx. Electronically Signed   By: Marnee SpringJonathon  Watts M.D.   On: 02/11/2020 19:17   CT ABDOMEN PELVIS W CONTRAST  Result Date: 02/27/2020 CLINICAL DATA:  Abdominal pain. Constipation. Possible ileus on abdominal x-ray earlier today. EXAM: CT ABDOMEN AND PELVIS WITH CONTRAST TECHNIQUE: Multidetector CT imaging of the abdomen and pelvis was performed using the standard protocol following bolus administration of intravenous contrast. CONTRAST:  75mL OMNIPAQUE IOHEXOL 300 MG/ML  SOLN COMPARISON:  CT 2 and half weeks ago 02/11/2020. FINDINGS: Lower chest: Improved pleural effusions from prior abdominal CT, small residual on the right. Heart is normal in size. Hepatobiliary: No focal hepatic lesion. Small gallstones without pericholecystic inflammation or biliary dilatation. Pancreas: No ductal dilatation or inflammation. Spleen: Normal in size without focal abnormality. Adrenals/Urinary Tract: Normal adrenal glands. No hydronephrosis or perinephric edema. Homogeneous renal enhancement with symmetric excretion on delayed phase imaging. Urinary bladder is physiologically distended without wall thickening. Stomach/Bowel: There is diffuse colonic distension. Liquid stool with air-fluid levels involves the cecum, ascending, and transverse colon with associated gaseous distension. More formed stool is seen in the descending and sigmoid colon. Sigmoid colon is redundant. Scattered sigmoid colonic diverticula without acute diverticulitis. Inspissated stool ball within the rectum with rectal distention of 8.2 cm. There is circumferential rectal wall thickening. No definite perirectal edema. Scattered foci of air adjacent to stool ball in the rectum may be adjacent to large stool ball versus small foci of pneumatosis. There is no evidence of perforation or extraluminal air. The appendix is partially obscured  by small volume of fluid in the right pericolic gutter, no evidence of appendicitis. There is fecalization of scattered small bowel contents without abnormal small bowel distention or small bowel obstruction. Stomach is unremarkable. Vascular/Lymphatic: Advanced aortic and branch atherosclerosis. Suspected stenosis at the origin of the celiac and SMA arteries due to calcified plaque. There is no mesenteric or portal venous gas. Portal vein is patent. No adenopathy. Reproductive: Prostate is unremarkable. Other: Small amount of free fluid in the right pericolic gutter. No free air or perforation. No intra-abdominal abscess. Minimal fat in the inguinal canals. Musculoskeletal: Diffuse degenerative change throughout the spine. There are no acute or suspicious osseous abnormalities. IMPRESSION: 1. Inspissated stool ball within the rectum with rectal distention of 8.2 cm, consistent with fecal impaction. There is circumferential rectal wall thickening which may represent stercoral proctitis, however no definite perirectal edema. Scattered foci of air adjacent to the large stool ball in the rectum may be air in the bowel lumen versus small foci of pneumatosis. There is no evidence of perforation or extraluminal air. 2. Diffuse colonic distension, formed stool in the descending and sigmoid colon, with liquid stool and air proximally. No small bowel obstruction. 3. Small amount of free fluid in the right pericolic gutter is likely reactive. 4. Cholelithiasis without gallbladder inflammation. 5. Improved pleural effusions from prior abdominal CT, small residual on the right. 6. Advanced aortic atherosclerosis. 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 Aortic Atherosclerosis (ICD10-I70.0). Electronically Signed   By: Narda RutherfordMelanie  Sanford M.D.   On: 02/27/2020 22:32   CT Abdomen Pelvis W  Contrast  Result Date: 02/11/2020 CLINICAL DATA:  Nausea and vomiting. Weakness. Recent  pneumonia EXAM: CT ABDOMEN AND PELVIS WITH CONTRAST TECHNIQUE: Multidetector CT imaging of the abdomen and pelvis was performed using the standard protocol following bolus administration of intravenous contrast. CONTRAST:  OMNIPAQUE IOHEXOL 300 MG/ML  SOLN COMPARISON:  None. FINDINGS: Lower Chest: No acute findings. Tiny pleural effusions noted bilaterally. Hepatobiliary: No hepatic masses identified. A few tiny sub-cm calcified gallstones are seen, however there is no evidence of cholecystitis or biliary dilatation. Pancreas:  No mass or inflammatory changes. Spleen: Within normal limits in size and appearance. Adrenals/Urinary Tract: No masses identified. No evidence of ureteral calculi or hydronephrosis. Stomach/Bowel: No evidence of obstruction, inflammatory process or abnormal fluid collections. Normal appendix visualized. Diverticulosis is seen mainly involving the sigmoid colon, however there is no evidence of diverticulitis. Vascular/Lymphatic: No pathologically enlarged lymph nodes. No abdominal aortic aneurysm. Aortic atherosclerosis noted. Reproductive:  No mass or other significant abnormality. Other:  None. Musculoskeletal:  No suspicious bone lesions identified. IMPRESSION: 1. Colonic diverticulosis, without radiographic evidence of diverticulitis or other acute findings. 2. Cholelithiasis. No radiographic evidence of cholecystitis. 3. Tiny bilateral pleural effusions. Aortic Atherosclerosis (ICD10-I70.0). Electronically Signed   By: Danae Orleans M.D.   On: 02/11/2020 14:08    Microbiology: Recent Results (from the past 240 hour(s))  MRSA PCR Screening     Status: None   Collection Time: 02/29/20  5:47 AM   Specimen: Nasopharyngeal  Result Value Ref Range Status   MRSA by PCR NEGATIVE NEGATIVE Final    Comment:        The GeneXpert MRSA Assay (FDA approved for NASAL specimens only), is one component of a comprehensive MRSA colonization surveillance program. It is not intended to  diagnose MRSA infection nor to guide or monitor treatment for MRSA infections. Performed at [facility], [address], [place], [place] [address]      Labs: Basic Metabolic Panel: Recent Labs  Lab 02/27/20 0831 02/27/20 2204 02/28/20 0430 02/29/20 0731 03/01/20 0707  NA 131* 127* 130* 134* 134*  K 3.0* 3.0* 2.9* 3.4* 3.1*  CL 92* 89* 93* 97* 99  CO2 26 27 27 27 24   GLUCOSE 117* 145* 110* 103* 105*  BUN 38* 41* 39* 38* 30*  CREATININE 1.44* 1.56* 1.56* 1.47* 1.39*  CALCIUM 7.9* 8.0* 7.7* 7.8* 7.7*  MG 2.6* 2.5*  --   --   --   PHOS  --  3.3  --   --   --    Liver Function Tests: Recent Labs  Lab 02/27/20 2204 02/28/20 0430  AST 52* 40  ALT 50* 39  ALKPHOS 119 94  BILITOT 0.6 0.6  PROT 6.8 5.9*  ALBUMIN 2.3* 2.1*   Recent Labs  Lab 02/27/20 2204  LIPASE 19   CBC: Recent Labs  Lab 02/26/20 0828 02/27/20 2204 02/28/20 0430 02/29/20 0731 03/01/20 0707  WBC 19.2* 14.9* 15.3* 15.9* 14.3*  NEUTROABS  --  11.9* 12.4* 12.5*  --   HGB 9.7* 10.0* 9.2* 8.7* 8.9*  HCT 31.6* 32.4* 30.5* 28.8* 29.9*  MCV 86.1 86.9 87.1 88.3 89.8  PLT 429* 413* 399 359 333   BNP (last 3 results) Recent Labs    02/17/20 0353 02/18/20 0420 02/19/20 0428  BNP 211.3* 151.9* 119.9*    Signed:  02/21/20 MD.  [facility] 03/01/2020, 2:17 PM

## 2020-03-01 NOTE — Care Management Important Message (Signed)
Important Message  Patient Details  Name: Jeffrey Ho MRN: [id number] Date of Birth: [birth year]   Medicare Important Message Given:  Yes     Corey Harold 03/01/2020, 2:12 PM

## 2020-03-01 NOTE — Progress Notes (Signed)
No bm's today.  IV removed and DC instructions reviewed.  Scripts sent to pharmacy.  Wife to drive home

## 2020-03-02 NOTE — Telephone Encounter (Signed)
MS Branham called Jeffrey Ho ED and wanted to speak to a Case Manager. They transferred the call to me. Ms Jeffrey Ho is  Very concerned because she has not heard from anyone regarding home health. The patient was discharged yesterday from [facility]. She also stated she never spoke to social services and nobody asked her about equipment at home, she just went out and bought a wheelchair. I apologized.  For any miscommunications, and messaged Melissa from advanced to see if she could give this patient a call.

## 2020-03-11 DIAGNOSIS — N1831 Chronic kidney disease, stage 3a: Secondary | ICD-10-CM | POA: Diagnosis not present

## 2020-03-11 DIAGNOSIS — I776 Arteritis, unspecified: Secondary | ICD-10-CM | POA: Diagnosis not present

## 2020-03-11 DIAGNOSIS — D631 Anemia in chronic kidney disease: Secondary | ICD-10-CM | POA: Diagnosis not present

## 2020-03-11 DIAGNOSIS — E8809 Other disorders of plasma-protein metabolism, not elsewhere classified: Secondary | ICD-10-CM | POA: Diagnosis not present

## 2020-03-20 DIAGNOSIS — Z681 Body mass index (BMI) 19 or less, adult: Secondary | ICD-10-CM | POA: Diagnosis not present

## 2020-03-20 DIAGNOSIS — R630 Anorexia: Secondary | ICD-10-CM | POA: Diagnosis not present

## 2020-03-20 DIAGNOSIS — F329 Major depressive disorder, single episode, unspecified: Secondary | ICD-10-CM | POA: Diagnosis not present

## 2020-03-20 DIAGNOSIS — E669 Obesity, unspecified: Secondary | ICD-10-CM | POA: Diagnosis not present

## 2020-03-20 DIAGNOSIS — K56609 Unspecified intestinal obstruction, unspecified as to partial versus complete obstruction: Secondary | ICD-10-CM | POA: Diagnosis not present

## 2020-03-22 ENCOUNTER — Other Ambulatory Visit: Payer: Self-pay

## 2020-03-22 ENCOUNTER — Inpatient Hospital Stay (HOSPITAL_COMMUNITY)
Admission: EM | Admit: 2020-03-22 | Discharge: 2020-03-31 | DRG: 682 | Disposition: A | Discharge status: Hospice | Payer: Medicare Other | Attending: Internal Medicine | Admitting: Internal Medicine

## 2020-03-22 ENCOUNTER — Encounter (HOSPITAL_COMMUNITY): Payer: Self-pay

## 2020-03-22 ENCOUNTER — Emergency Department (HOSPITAL_COMMUNITY): Payer: Medicare Other

## 2020-03-22 DIAGNOSIS — J189 Pneumonia, unspecified organism: Secondary | ICD-10-CM | POA: Diagnosis not present

## 2020-03-22 DIAGNOSIS — E43 Unspecified severe protein-calorie malnutrition: Secondary | ICD-10-CM | POA: Diagnosis present

## 2020-03-22 DIAGNOSIS — R400 Somnolence: Secondary | ICD-10-CM | POA: Diagnosis not present

## 2020-03-22 DIAGNOSIS — M353 Polymyalgia rheumatica: Secondary | ICD-10-CM | POA: Diagnosis present

## 2020-03-22 DIAGNOSIS — Z7189 Other specified counseling: Secondary | ICD-10-CM | POA: Diagnosis not present

## 2020-03-22 DIAGNOSIS — Z20822 Contact with and (suspected) exposure to covid-19: Secondary | ICD-10-CM | POA: Diagnosis not present

## 2020-03-22 DIAGNOSIS — Z87891 Personal history of nicotine dependence: Secondary | ICD-10-CM

## 2020-03-22 DIAGNOSIS — N179 Acute kidney failure, unspecified: Secondary | ICD-10-CM | POA: Diagnosis not present

## 2020-03-22 DIAGNOSIS — Z66 Do not resuscitate: Secondary | ICD-10-CM | POA: Diagnosis present

## 2020-03-22 DIAGNOSIS — E663 Overweight: Secondary | ICD-10-CM | POA: Diagnosis present

## 2020-03-22 DIAGNOSIS — Z79899 Other long term (current) drug therapy: Secondary | ICD-10-CM

## 2020-03-22 DIAGNOSIS — E785 Hyperlipidemia, unspecified: Secondary | ICD-10-CM | POA: Diagnosis present

## 2020-03-22 DIAGNOSIS — D649 Anemia, unspecified: Secondary | ICD-10-CM | POA: Diagnosis present

## 2020-03-22 DIAGNOSIS — Z515 Encounter for palliative care: Secondary | ICD-10-CM | POA: Diagnosis not present

## 2020-03-22 DIAGNOSIS — R339 Retention of urine, unspecified: Secondary | ICD-10-CM | POA: Diagnosis present

## 2020-03-22 DIAGNOSIS — I10 Essential (primary) hypertension: Secondary | ICD-10-CM | POA: Diagnosis present

## 2020-03-22 DIAGNOSIS — H5461 Unqualified visual loss, right eye, normal vision left eye: Secondary | ICD-10-CM | POA: Diagnosis present

## 2020-03-22 DIAGNOSIS — D72829 Elevated white blood cell count, unspecified: Secondary | ICD-10-CM | POA: Diagnosis present

## 2020-03-22 DIAGNOSIS — E872 Acidosis: Secondary | ICD-10-CM | POA: Diagnosis not present

## 2020-03-22 DIAGNOSIS — I129 Hypertensive chronic kidney disease with stage 1 through stage 4 chronic kidney disease, or unspecified chronic kidney disease: Secondary | ICD-10-CM | POA: Diagnosis present

## 2020-03-22 DIAGNOSIS — N183 Chronic kidney disease, stage 3 unspecified: Secondary | ICD-10-CM | POA: Diagnosis present

## 2020-03-22 DIAGNOSIS — E861 Hypovolemia: Secondary | ICD-10-CM | POA: Diagnosis present

## 2020-03-22 DIAGNOSIS — Z8249 Family history of ischemic heart disease and other diseases of the circulatory system: Secondary | ICD-10-CM

## 2020-03-22 DIAGNOSIS — E8770 Fluid overload, unspecified: Secondary | ICD-10-CM | POA: Diagnosis not present

## 2020-03-22 DIAGNOSIS — H9190 Unspecified hearing loss, unspecified ear: Secondary | ICD-10-CM | POA: Diagnosis present

## 2020-03-22 DIAGNOSIS — E860 Dehydration: Secondary | ICD-10-CM | POA: Diagnosis not present

## 2020-03-22 DIAGNOSIS — E871 Hypo-osmolality and hyponatremia: Secondary | ICD-10-CM | POA: Diagnosis present

## 2020-03-22 DIAGNOSIS — R601 Generalized edema: Secondary | ICD-10-CM | POA: Diagnosis not present

## 2020-03-22 DIAGNOSIS — R627 Adult failure to thrive: Secondary | ICD-10-CM | POA: Diagnosis present

## 2020-03-22 DIAGNOSIS — M6281 Muscle weakness (generalized): Secondary | ICD-10-CM | POA: Diagnosis present

## 2020-03-22 DIAGNOSIS — Z6827 Body mass index (BMI) 27.0-27.9, adult: Secondary | ICD-10-CM

## 2020-03-22 DIAGNOSIS — R0609 Other forms of dyspnea: Secondary | ICD-10-CM | POA: Diagnosis not present

## 2020-03-22 DIAGNOSIS — R0602 Shortness of breath: Secondary | ICD-10-CM

## 2020-03-22 DIAGNOSIS — E8809 Other disorders of plasma-protein metabolism, not elsewhere classified: Secondary | ICD-10-CM | POA: Diagnosis not present

## 2020-03-22 DIAGNOSIS — E46 Unspecified protein-calorie malnutrition: Secondary | ICD-10-CM | POA: Diagnosis not present

## 2020-03-22 DIAGNOSIS — Z7982 Long term (current) use of aspirin: Secondary | ICD-10-CM | POA: Diagnosis not present

## 2020-03-22 DIAGNOSIS — G9341 Metabolic encephalopathy: Secondary | ICD-10-CM | POA: Diagnosis present

## 2020-03-22 DIAGNOSIS — R0600 Dyspnea, unspecified: Secondary | ICD-10-CM | POA: Diagnosis present

## 2020-03-22 DIAGNOSIS — R451 Restlessness and agitation: Secondary | ICD-10-CM | POA: Diagnosis present

## 2020-03-22 LAB — COMPREHENSIVE METABOLIC PANEL
ALT: 69 U/L — ABNORMAL HIGH (ref 0–44)
AST: 71 U/L — ABNORMAL HIGH (ref 15–41)
Albumin: 1.6 g/dL — ABNORMAL LOW (ref 3.5–5.0)
Alkaline Phosphatase: 112 U/L (ref 38–126)
Anion gap: 11 (ref 5–15)
BUN: 73 mg/dL — ABNORMAL HIGH (ref 8–23)
CO2: 26 mmol/L (ref 22–32)
Calcium: 8.1 mg/dL — ABNORMAL LOW (ref 8.9–10.3)
Chloride: 90 mmol/L — ABNORMAL LOW (ref 98–111)
Creatinine, Ser: 3.52 mg/dL — ABNORMAL HIGH (ref 0.61–1.24)
GFR calc Af Amer: 18 mL/min — ABNORMAL LOW (ref >60)
GFR calc non Af Amer: 15 mL/min — ABNORMAL LOW (ref >60)
Glucose, Bld: 114 mg/dL — ABNORMAL HIGH (ref 70–99)
Potassium: 3.9 mmol/L (ref 3.5–5.1)
Sodium: 127 mmol/L — ABNORMAL LOW (ref 135–145)
Total Bilirubin: 0.5 mg/dL (ref 0.3–1.2)
Total Protein: 6.5 g/dL (ref 6.5–8.1)

## 2020-03-22 LAB — PROTIME-INR
INR: 1.2 (ref 0.8–1.2)
Prothrombin Time: 15 seconds (ref 11.4–15.2)

## 2020-03-22 LAB — I-STAT VENOUS BLOOD GAS, ED
Acid-Base Excess: 6 mmol/L — ABNORMAL HIGH (ref 0.0–2.0)
Bicarbonate: 29 mmol/L — ABNORMAL HIGH (ref 20.0–28.0)
Calcium, Ion: 1.02 mmol/L — ABNORMAL LOW (ref 1.15–1.40)
HCT: 20 % — ABNORMAL LOW (ref 39.0–52.0)
Hemoglobin: 6.8 g/dL — CL (ref 13.0–17.0)
O2 Saturation: 92 %
Potassium: 4.1 mmol/L (ref 3.5–5.1)
Sodium: 129 mmol/L — ABNORMAL LOW (ref 135–145)
TCO2: 30 mmol/L (ref 22–32)
pCO2, Ven: 33.8 mmHg — ABNORMAL LOW (ref 44.0–60.0)
pH, Ven: 7.541 — ABNORMAL HIGH (ref 7.250–7.430)
pO2, Ven: 55 mmHg — ABNORMAL HIGH (ref 32.0–45.0)

## 2020-03-22 LAB — I-STAT CHEM 8, ED
BUN: 75 mg/dL — ABNORMAL HIGH (ref 8–23)
Calcium, Ion: 1.1 mmol/L — ABNORMAL LOW (ref 1.15–1.40)
Chloride: 89 mmol/L — ABNORMAL LOW (ref 98–111)
Creatinine, Ser: 4 mg/dL — ABNORMAL HIGH (ref 0.61–1.24)
Glucose, Bld: 114 mg/dL — ABNORMAL HIGH (ref 70–99)
HCT: 22 % — ABNORMAL LOW (ref 39.0–52.0)
Hemoglobin: 7.5 g/dL — ABNORMAL LOW (ref 13.0–17.0)
Potassium: 3.9 mmol/L (ref 3.5–5.1)
Sodium: 128 mmol/L — ABNORMAL LOW (ref 135–145)
TCO2: 28 mmol/L (ref 22–32)

## 2020-03-22 LAB — DIFFERENTIAL
Abs Immature Granulocytes: 0 10*3/uL (ref 0.00–0.07)
Basophils Absolute: 0 10*3/uL (ref 0.0–0.1)
Basophils Relative: 0 %
Eosinophils Absolute: 1 10*3/uL — ABNORMAL HIGH (ref 0.0–0.5)
Eosinophils Relative: 4 %
Lymphocytes Relative: 4 %
Lymphs Abs: 1 10*3/uL (ref 0.7–4.0)
Monocytes Absolute: 0.5 10*3/uL (ref 0.1–1.0)
Monocytes Relative: 2 %
Neutro Abs: 22.9 10*3/uL — ABNORMAL HIGH (ref 1.7–7.7)
Neutrophils Relative %: 90 %
nRBC: 0 /100 WBC

## 2020-03-22 LAB — CBC
HCT: 23.1 % — ABNORMAL LOW (ref 39.0–52.0)
Hemoglobin: 7 g/dL — ABNORMAL LOW (ref 13.0–17.0)
MCH: 26.4 pg (ref 26.0–34.0)
MCHC: 30.3 g/dL (ref 30.0–36.0)
MCV: 87.2 fL (ref 80.0–100.0)
Platelets: 480 10*3/uL — ABNORMAL HIGH (ref 150–400)
RBC: 2.65 MIL/uL — ABNORMAL LOW (ref 4.22–5.81)
RDW: 16.4 % — ABNORMAL HIGH (ref 11.5–15.5)
WBC: 25.4 10*3/uL — ABNORMAL HIGH (ref 4.0–10.5)
nRBC: 0 % (ref 0.0–0.2)

## 2020-03-22 LAB — LACTIC ACID, PLASMA
Lactic Acid, Venous: 2.1 mmol/L (ref 0.5–1.9)
Lactic Acid, Venous: 1.7 mmol/L (ref 0.5–1.9)

## 2020-03-22 LAB — PREPARE RBC (CROSSMATCH)

## 2020-03-22 LAB — SARS CORONAVIRUS 2 BY RT PCR (HOSPITAL ORDER, PERFORMED IN ~~LOC~~ HOSPITAL LAB): SARS Coronavirus 2: NEGATIVE

## 2020-03-22 LAB — AMMONIA: Ammonia: 12 umol/L (ref 9–35)

## 2020-03-22 LAB — PROCALCITONIN: Procalcitonin: 0.56 ng/mL

## 2020-03-22 LAB — CBG MONITORING, ED: Glucose-Capillary: 106 mg/dL — ABNORMAL HIGH (ref 70–99)

## 2020-03-22 LAB — APTT: aPTT: 38 seconds — ABNORMAL HIGH (ref 24–36)

## 2020-03-22 MED ORDER — METOPROLOL TARTRATE 12.5 MG HALF TABLET
12.5000 mg | ORAL_TABLET | Freq: Two times a day (BID) | ORAL | Status: DC
  Administered 2020-03-22 – 2020-03-27 (×9): 12.5 mg via ORAL
  Filled 2020-03-22 (×10): qty 1

## 2020-03-22 MED ORDER — VANCOMYCIN VARIABLE DOSE PER UNSTABLE RENAL FUNCTION (PHARMACIST DOSING): Status: DC

## 2020-03-22 MED ORDER — FOLIC ACID 1 MG PO TABS
1.0000 mg | ORAL_TABLET | Freq: Every day | ORAL | Status: DC
  Administered 2020-03-23 – 2020-03-27 (×5): 1 mg via ORAL
  Filled 2020-03-22 (×5): qty 1

## 2020-03-22 MED ORDER — ENOXAPARIN SODIUM 30 MG/0.3ML ~~LOC~~ SOLN: 30.0000 mg | SUBCUTANEOUS | Status: DC

## 2020-03-22 MED ORDER — SODIUM CHLORIDE 0.9 % IV SOLN
500.0000 mg | INTRAVENOUS | Status: DC
  Administered 2020-03-22 – 2020-03-23 (×2): 500 mg via INTRAVENOUS
  Filled 2020-03-22 (×3): qty 500

## 2020-03-22 MED ORDER — LACTATED RINGERS IV BOLUS (SEPSIS): 1000.0000 mL | Freq: Once | INTRAVENOUS | Status: DC

## 2020-03-22 MED ORDER — SODIUM CHLORIDE 0.9 % IV SOLN: 2.0000 g | INTRAVENOUS | Status: DC

## 2020-03-22 MED ORDER — SODIUM CHLORIDE 0.9 % IV SOLN: INTRAVENOUS | Status: DC

## 2020-03-22 MED ORDER — VANCOMYCIN HCL 1750 MG/350ML IV SOLN
1750.0000 mg | Freq: Once | INTRAVENOUS | Status: AC
  Administered 2020-03-22: 1750 mg via INTRAVENOUS
  Filled 2020-03-22: qty 350

## 2020-03-22 MED ORDER — LINACLOTIDE 72 MCG PO CAPS
72.0000 ug | ORAL_CAPSULE | Freq: Every day | ORAL | Status: DC
  Administered 2020-03-23 – 2020-03-25 (×3): 72 ug via ORAL
  Filled 2020-03-22 (×5): qty 1

## 2020-03-22 MED ORDER — SODIUM CHLORIDE 0.9 % IV SOLN
2.0000 g | INTRAVENOUS | Status: DC
  Administered 2020-03-23 – 2020-03-26 (×4): 2 g via INTRAVENOUS
  Filled 2020-03-22 (×2): qty 2
  Filled 2020-03-22 (×2): qty 20
  Filled 2020-03-22: qty 2

## 2020-03-22 MED ORDER — SODIUM CHLORIDE 0.9 % IV SOLN: 500.0000 mg | INTRAVENOUS | Status: DC

## 2020-03-22 MED ORDER — ATORVASTATIN CALCIUM 10 MG PO TABS
20.0000 mg | ORAL_TABLET | Freq: Every day | ORAL | Status: DC
  Administered 2020-03-23 – 2020-03-27 (×5): 20 mg via ORAL
  Filled 2020-03-22 (×5): qty 2

## 2020-03-22 MED ORDER — ENSURE ENLIVE PO LIQD
237.0000 mL | Freq: Three times a day (TID) | ORAL | Status: DC
  Administered 2020-03-23 – 2020-03-30 (×15): 237 mL via ORAL
  Filled 2020-03-22: qty 237

## 2020-03-22 MED ORDER — SODIUM CHLORIDE 0.9 % IV SOLN: Freq: Once | INTRAVENOUS | Status: DC

## 2020-03-22 MED ORDER — SODIUM CHLORIDE 0.9% IV SOLUTION: Freq: Once | INTRAVENOUS | Status: DC

## 2020-03-22 MED ORDER — FERROUS SULFATE 325 (65 FE) MG PO TABS
325.0000 mg | ORAL_TABLET | Freq: Two times a day (BID) | ORAL | Status: DC
  Administered 2020-03-22 – 2020-03-27 (×9): 325 mg via ORAL
  Filled 2020-03-22 (×10): qty 1

## 2020-03-22 MED ORDER — SODIUM CHLORIDE 0.9% FLUSH: 3.0000 mL | Freq: Once | INTRAVENOUS | Status: DC

## 2020-03-22 MED ORDER — SODIUM CHLORIDE 0.9 % IV SOLN
2.0000 g | Freq: Once | INTRAVENOUS | Status: AC
  Administered 2020-03-22: 2 g via INTRAVENOUS
  Filled 2020-03-22: qty 2

## 2020-03-22 MED ORDER — ASPIRIN EC 81 MG PO TBEC
81.0000 mg | DELAYED_RELEASE_TABLET | Freq: Every day | ORAL | Status: DC
  Administered 2020-03-23 – 2020-03-27 (×5): 81 mg via ORAL
  Filled 2020-03-22 (×5): qty 1

## 2020-03-22 NOTE — Progress Notes (Signed)
Pharmacy Antibiotic Note  PAOLO OKANE is a 85 y.o. male admitted on 03/22/2020 with pneumonia.  Pharmacy has been consulted for Vancomycin dosing.   Height: 6' (182.9 cm) Weight: 92.1 kg (203 lb) IBW/kg (Calculated) : 77.6  Temp (24hrs), Avg:97.7 F (36.5 C), Min:97.7 F (36.5 C), Max:97.7 F (36.5 C)  Recent Labs  Lab 03/22/20 1210 03/22/20 1238  WBC 25.4*  --   CREATININE 3.52* 4.00*  LATICACIDVEN 2.1*  --     Estimated Creatinine Clearance: 15.4 mL/min (A) (by C-G formula based on SCr of 4 mg/dL (H)).    No Known Allergies  Antimicrobials this admission: 6/18 Cefepime >>  6/18 Vancomycin >>   Dose adjustments this admission: N/a  Microbiology results: Pending   Plan:   - Vancomycin 1750mg  IV x 1 dose  - Will dose vancomycin based on random levels as patient has AKI baseline Scr ~ 1.5 currently 4.0 - Monitor patients renal function and urine output  - De-escalate ABX when appropriate   Thank you for allowing pharmacy to be a part of this patient's care.  PharmD. BCPS 03/22/2020 2:43 PM

## 2020-03-22 NOTE — ED Provider Notes (Signed)
[facility] EMERGENCY DEPARTMENT Provider Note   CSN: [id number] Arrival date & time: 03/22/20  1149     History Chief Complaint  Patient presents with  . Altered Mental Status  . Abnormal Lab    Jeffrey Ho is a 85 y.o. male.  HPI Patient's wife reports he has been sleeping a lot since his hospitalization.  She reports he will drift off and sleep for a while and not recognize it he has been asleep.  There has been some confusion.  The patient reports that he can tell that he is fatigued but does not have any focal pain.  He does feel generally somewhat weak and tired.  Patient does have significant swelling of the legs.  Patient's wife reports that has been ongoing problem.  She reports that the swelling waxes and wanes.  It does improve with Lasix.  She reports they are trying to keep him hydrated and typically he drinks about six to eight glasses of water daily.  No fevers.  No vomiting.  Patient has been making urine.  No headaches. They were seen at the primary care doctor's office yesterday.  They were called regarding abnormal labs and the need to go to the emergency department.    Past Medical History:  Diagnosis Date  . Blindness of right eye   . Carotid artery occlusion   . HOH (hard of hearing)   . Hyperlipidemia   . Hypertension     Patient Active Problem List   Diagnosis Date Noted  . Colitis   . Hypokalemia   . Proctitis 02/28/2020  . Chronic constipation 02/23/2020  . PMR (polymyalgia rheumatica) (HCC) 02/23/2020  . Bilateral lower extremity edema 02/23/2020  . AKI (acute kidney injury) (HCC) 02/21/2020  . Failure to thrive in adult 02/11/2020  . Tachypnea 02/11/2020  . Physical deconditioning 02/11/2020  . Weakness 01/24/2020  . Symptomatic anemia 01/23/2020  . Prolonged QT interval 01/23/2020  . Hyponatremia 01/23/2020  . Hypoalbuminemia due to protein-calorie malnutrition (HCC) 01/23/2020  . Leukocytosis 01/23/2020  . HTN  (hypertension) 12/31/2019  . Carotid artery disease without cerebral infarction (HCC) 12/31/2019  . Cardiac ischemia 12/31/2019  . CAP (community acquired pneumonia) 12/31/2019  . Hyperlipidemia 12/31/2019  . Heme positive stool 12/31/2019  . Occlusion and stenosis of carotid artery without mention of cerebral infarction 08/22/2012    Past Surgical History:  Procedure Laterality Date  . BIOPSY  01/26/2020   Procedure: BIOPSY;  Surgeon: Corbin Ade, MD;  Location: [facility];  Service: Endoscopy;;  gastric biopsies for H. Pylori  . CAROTID ENDARTERECTOMY     left CEA  06/24.2010  . CAROTID ENDARTERECTOMY     right CEA  02/20/2009  . COLONOSCOPY WITH PROPOFOL N/A 01/26/2020   Procedure: COLONOSCOPY WITH PROPOFOL;  Surgeon: Corbin Ade, MD;  Location: [facility];  Service: Endoscopy;  Laterality: N/A;  . ESOPHAGOGASTRODUODENOSCOPY (EGD) WITH PROPOFOL N/A 01/26/2020   Procedure: ESOPHAGOGASTRODUODENOSCOPY (EGD) WITH PROPOFOL;  Surgeon: Corbin Ade, MD;  Location: [facility];  Service: Endoscopy;  Laterality: N/A;  . EYE SURGERY Left Nov. 2015   Cataract  . POLYPECTOMY  01/26/2020   Procedure: POLYPECTOMY;  Surgeon: Corbin Ade, MD;  Location: [facility];  Service: Endoscopy;;  Splenic Flexure polyp        Family History  Problem Relation Age of Onset  . Heart disease Father        Before age 52    Social History  Tobacco Use  . Smoking status: Former Smoker    Quit date: 08/10/1969    Years since quitting: 50.6  . Smokeless tobacco: Never Used  Substance Use Topics  . Alcohol use: No  . Drug use: No    Home Medications Prior to Admission medications   Medication Sig Start Date End Date Taking? Authorizing Provider  aspirin 81 MG tablet Take 81 mg by mouth daily.   Yes [provider]  atorvastatin (LIPITOR) 20 MG tablet 1 tablet daily. 05/07/16  Yes [provider]  clotrimazole-betamethasone (LOTRISONE) cream Apply 1 application  topically 2 (two) times daily as needed. fungal erythema of skin folds. 01/23/20  Yes [provider]  feeding supplement, ENSURE ENLIVE, (ENSURE ENLIVE) LIQD Take 237 mLs by mouth 3 (three) times daily between meals. 02/19/20  Yes Leroy Sea, MD  ferrous sulfate 325 (65 FE) MG tablet Take 1 tablet (325 mg total) by mouth 2 (two) times daily with a meal. 02/19/20 02/18/21 Yes Leroy Sea, MD  fish oil-omega-3 fatty acids 1000 MG capsule Take 1 g by mouth daily. 02/19/20  Yes [provider]  folic acid (FOLVITE) 1 MG tablet Take 1 mg by mouth daily. 01/23/20  Yes [provider]  furosemide (LASIX) 40 MG tablet Take 1 tablet (40 mg total) by mouth daily as needed for fluid or edema. 03/01/20  Yes Vassie Loll, MD  LINZESS 72 MCG capsule Take 72 mcg by mouth daily. 03/07/20  Yes [provider]  metoprolol tartrate (LOPRESSOR) 25 MG tablet Take 12.5 mg by mouth 2 (two) times daily with a meal.   Yes [provider]  niacin 500 MG tablet Take 500 mg by mouth daily with breakfast.   Yes [provider]  ondansetron (ZOFRAN) 4 MG tablet Take 4 mg by mouth every 8 (eight) hours as needed for nausea/vomiting. 01/27/20  Yes [provider]  mirtazapine (REMERON) 15 MG tablet Take 15 mg by mouth at bedtime. 03/20/20   [provider]  NON FORMULARY Diet: _____ Regular, ___x___ NAS, _______Consistent Carbohydrate, _______NPO _____Other 02/19/20   [provider]    Allergies    Patient has no known allergies.  Review of Systems   Review of Systems 10 systems reviewed and negative except as per HPI Physical Exam Updated Vital Signs BP 123/60   Pulse 86   Temp 97.7 F (36.5 C) (Oral)   Resp 18   Ht 6' (1.829 m)   Wt 92.1 kg   SpO2 100%   BMI 27.53 kg/m   Physical Exam Constitutional:      Comments: Patient is somnolent, he awakens to voice and light stimulus and interacts appropriately.  Sonorous  respirations when asleep but no respiratory distress awake  HENT:     Head: Normocephalic and atraumatic.     Mouth/Throat:     Mouth: Mucous membranes are moist.     Pharynx: Oropharynx is clear.  Eyes:     Extraocular Movements: Extraocular movements intact.     Conjunctiva/sclera: Conjunctivae normal.     Pupils: Pupils are equal, round, and reactive to light.  Cardiovascular:     Rate and Rhythm: Normal rate and regular rhythm.  Pulmonary:     Effort: Pulmonary effort is normal.     Breath sounds: Normal breath sounds.  Abdominal:     General: There is no distension.     Palpations: Abdomen is soft.     Tenderness: There is no abdominal tenderness. There is  no guarding.  Musculoskeletal:        General: Normal range of motion.     Right lower leg: Edema present.     Left lower leg: Edema present.     Comments: 3+ pitting edema B/l  Neurological:     Comments: Patient is somnolent but when awake appropriate.  He will follow commands appropriately.  He does have generalized weakness.  He has difficulty pulling himself forward to sit up in the bed.     ED Results / Procedures / Treatments   Labs (all labs ordered are listed, but only abnormal results are displayed) Labs Reviewed  APTT - Abnormal; Notable for the following components:      Result Value   aPTT 38 (*)    All other components within normal limits  CBC - Abnormal; Notable for the following components:   WBC 25.4 (*)    RBC 2.65 (*)    Hemoglobin 7.0 (*)    HCT 23.1 (*)    RDW 16.4 (*)    Platelets 480 (*)    All other components within normal limits  DIFFERENTIAL - Abnormal; Notable for the following components:   Neutro Abs 22.9 (*)    Eosinophils Absolute 1.0 (*)    All other components within normal limits  COMPREHENSIVE METABOLIC PANEL - Abnormal; Notable for the following components:   Sodium 127 (*)    Chloride 90 (*)    Glucose, Bld 114 (*)    BUN 73 (*)    Creatinine, Ser 3.52 (*)    Calcium  8.1 (*)    Albumin 1.6 (*)    AST 71 (*)    ALT 69 (*)    GFR calc non Af Amer 15 (*)    GFR calc Af Amer 18 (*)    All other components within normal limits  LACTIC ACID, PLASMA - Abnormal; Notable for the following components:   Lactic Acid, Venous 2.1 (*)    All other components within normal limits  I-STAT CHEM 8, ED - Abnormal; Notable for the following components:   Sodium 128 (*)    Chloride 89 (*)    BUN 75 (*)    Creatinine, Ser 4.00 (*)    Glucose, Bld 114 (*)    Calcium, Ion 1.10 (*)    Hemoglobin 7.5 (*)    HCT 22.0 (*)    All other components within normal limits  CBG MONITORING, ED - Abnormal; Notable for the following components:   Glucose-Capillary 106 (*)    All other components within normal limits  CULTURE, BLOOD (ROUTINE X 2)  CULTURE, BLOOD (ROUTINE X 2)  PROTIME-INR  LACTIC ACID, PLASMA  URINALYSIS, ROUTINE W REFLEX MICROSCOPIC  AMMONIA  BLOOD GAS, VENOUS    EKG EKG Interpretation  Date/Time:  Friday March 22 2020 11:49:05 EDT Ventricular Rate:  85 PR Interval:  152 QRS Duration: 92 QT Interval:  374 QTC Calculation: 445 R Axis:   16 Text Interpretation: Normal sinus rhythm Nonspecific ST and T wave abnormality Abnormal ECG no sig change from old Confirmed by Charlesetta Shanks [phone]) on 03/22/2020 4:08:46 PM   Radiology DG Chest 2 View  Result Date: 03/22/2020 CLINICAL DATA:  Suspected sepsis. EXAM: CHEST - 2 VIEW COMPARISON:  02/11/2020.  CT 01/23/2020. FINDINGS: Mediastinum and hilar structures normal. Heart size normal. Bilateral small of infiltrate and or atelectasis/scarring again noted. Similar findings noted on prior exam. New mild left mid lung field subsegmental atelectasis/infiltrate. IMPRESSION: 1. Bilateral small pulmonary densities  consistent with mild infiltrates and or atelectasis/scarring again noted. 2. New mild left mid lung field subsegmental atelectasis/infiltrate. Electronically Signed   By: Maisie Fus  Register   On: 03/22/2020  13:15   CT HEAD WO CONTRAST  Result Date: 03/22/2020 CLINICAL DATA:  Left-sided facial droop EXAM: CT HEAD WITHOUT CONTRAST TECHNIQUE: Contiguous axial images were obtained from the base of the skull through the vertex without intravenous contrast. COMPARISON:  02/16/2009, 02/11/2020 FINDINGS: Brain: No evidence of acute infarction, hemorrhage, hydrocephalus, extra-axial collection or mass lesion/mass effect. Scattered low-density changes within the periventricular and subcortical white matter compatible with chronic microvascular ischemic change. Moderate diffuse cerebral volume loss. Vascular: Atherosclerotic calcifications involving the large vessels of the skull base. No unexpected hyperdense vessel. Skull: Normal. Negative for fracture or focal lesion. Sinuses/Orbits: Air-fluid level within the left sphenoid sinus and scattered ethmoid air cell opacification. Orbital structures intact. Other: None. IMPRESSION: 1. No acute intracranial findings. 2. Chronic microvascular ischemic change and cerebral volume loss. 3. Air-fluid level within the left sphenoid sinus and scattered ethmoid air cell opacification. Correlate for signs and symptoms of acute sinusitis. Electronically Signed   By: Duanne Guess D.O.   On: 03/22/2020 14:42    Procedures Procedures (including critical care time)  Medications Ordered in ED Medications  sodium chloride flush (NS) 0.9 % injection 3 mL (has no administration in time range)  ceFEPIme (MAXIPIME) 2 g in sodium chloride 0.9 % 100 mL IVPB (2 g Intravenous New Bag/Given 03/22/20 1558)  vancomycin (VANCOREADY) IVPB 1750 mg/350 mL (has no administration in time range)  vancomycin variable dose per unstable renal function (pharmacist dosing) (has no administration in time range)  0.9 %  sodium chloride infusion (has no administration in time range)    ED Course  I have reviewed the triage vital signs and the nursing notes.  Pertinent labs & imaging results that were  available during my care of the patient were reviewed by me and considered in my medical decision making (see chart for details).  Clinical Course as of Mar 23 1607  Fri Mar 22, 2020  1601 Consult: Dr. Ophelia Charter for admission.   [MP]    Clinical Course User Index [MP] Arby Barrette, MD   MDM Rules/Calculators/A&P                         Patient presents by recommendation of outpatient provider for abnormal labs.  Patient does have hyponatremia and significant AKI as compared to last values.  Patient also exhibits somnolence.  He does not appear to have focal motor deficits to suggest an acute stroke.  Patient also has significant leukocytosis and infiltrate identified on chest x-ray.  Empirically started on HCAP antibiotics.  Will admit for further evaluation and treatment.  Final Clinical Impression(s) / ED Diagnoses Final diagnoses:  AKI (acute kidney injury) (HCC)  Somnolence  Hyponatremia    Rx / DC Orders ED Discharge Orders    None       Arby Barrette, MD 03/22/20 1610

## 2020-03-22 NOTE — ED Triage Notes (Signed)
Pt arrives POV w/ wife sent by PCP for eval of low sodium, elevated creatinine, elevated WBC, weakness and "concern for stroke" per wife. Pt was seen at PCP on Wednesday, had bloodwork at that time and family was notified today. Bloodwork not available in system yet, wife is unsure of specifics.Wife also reports concern for stroke, pt w/ mild L sided facial droop in triage, but no other appreciable unilateral weakness/numbness or neuro deficits on exam. Wife is unsure of any last known well in regards to facial droop, therefore not activated. Pt has been weak and altered since at least wednesday

## 2020-03-22 NOTE — H&P (Signed)
History and Physical    Jeffrey Sewerelson E Broxton AVW:[id number] DOB: 07/25/1935 DOA: 03/22/2020  PCP: Elfredia NevinsFusco, Lawrence, MD Consultants:  Karilyn Cotaehman - GI Patient coming from: Home - lives with wife; NOK:  Wife, Shaune PollackGloria Westrich, [phone]; Son-in-law is MD, Harlene Saltsavid Johnson, [phone] (ok with text)  Chief Complaint:  Abnormal labs  HPI: Jeffrey Ho is a 85 y.o. male with medical history significant of HTN; HLD; carotid artery occlusion; and R eye blindness presenting with abnormal labs.  Since last hospitalization, he has had home therapy and HHN.  He has good days and bad days.  Yesterday was a pretty good day with therapy.  His legs have been swollen and they went to their doctor Wednesday; they were told to increase fluid pills from as needed to 2 daily.  Today, he has had no energy and is not eating.  He is having to force him to eat.  He will drink a smoothie but otherwise has no appetite and little PO intake.  On Wednesday, he was given Remeron (?) and he has taken it for 2 nights with no change.  His doctor on Wednesday ordered blood work and they called this AM and said he should come in to the ER.  He is SOB with exertion but this is unchanged.  He has occasional coughing spells.  His weight was 234 and went down to 190 but recently 203.    He was last admitted from 5/25-28 for proctitis with stercoral colitis.  He was disimpacted in the ER and admitted with improvement.  He was DNR.  He was recommended for SNF and declined.  He has had 4 ER visits and 1 admission in the last 6 months.  He went to rehab about 9 days ago.  Previously, his family reported that "his energy is getting worse.  Just moving to the bathroom is exhausting.  He walks short distances and is wiped out.  He stays nauseated.  He is SOB now just from using the bedside commode.  He eats small amounts of food.  He felt better after hospital d/c and worse since".    His SIL (MD) previously reported that "he is very vibrant, walks several  miles a day and works on heavy equipment.  Has been slowing down since maybe Christmas."  I attempted to speak with his son in law (MD) again.  I was unable to reach him and left a message.   He called back.  He was started on remeron recently, very poor intake for months.  He and his wife have talked about this and he talks about "giving up".     ED Course:  Declining at home - somnolent with confusion, falls asleep easily.  Has AKI - uncertain etiology.  Also with lung infiltrates and elevated WBC.  Started on HCAP coverage.  Appears volume overloaded but lungs clear.  Review of Systems: As per HPI; otherwise review of systems reviewed and negative.   Ambulatory Status:  Ambulates with a walker, limited distances  COVID Vaccine Status:   Complete  Past Medical History:  Diagnosis Date  . Blindness of right eye   . Carotid artery occlusion   . HOH (hard of hearing)   . Hyperlipidemia   . Hypertension   . Stercoral ulcer of rectum 02/2020    Past Surgical History:  Procedure Laterality Date  . BIOPSY  01/26/2020   Procedure: BIOPSY;  Surgeon: Corbin Adeourk, Robert M, MD;  Location: [facility];  Service: Endoscopy;;  gastric  biopsies for H. Pylori  . CAROTID ENDARTERECTOMY     left CEA  06/24.2010  . CAROTID ENDARTERECTOMY     right CEA  02/20/2009  . COLONOSCOPY WITH PROPOFOL N/A 01/26/2020   Procedure: COLONOSCOPY WITH PROPOFOL;  Surgeon: Daneil Dolin, MD;  Location: [facility];  Service: Endoscopy;  Laterality: N/A;  . ESOPHAGOGASTRODUODENOSCOPY (EGD) WITH PROPOFOL N/A 01/26/2020   Procedure: ESOPHAGOGASTRODUODENOSCOPY (EGD) WITH PROPOFOL;  Surgeon: Daneil Dolin, MD;  Location: [facility];  Service: Endoscopy;  Laterality: N/A;  . EYE SURGERY Left Nov. 2015   Cataract  . POLYPECTOMY  01/26/2020   Procedure: POLYPECTOMY;  Surgeon: Daneil Dolin, MD;  Location: [facility];  Service: Endoscopy;;  Splenic Flexure polyp     Social History   Socioeconomic History  .  Marital status: Married    Spouse name: Not on file  . Number of children: Not on file  . Years of education: Not on file  . Highest education level: Not on file  Occupational History  . Occupation: retired  Tobacco Use  . Smoking status: Former Smoker    Quit date: 08/10/1969    Years since quitting: 50.6  . Smokeless tobacco: Never Used  Substance and Sexual Activity  . Alcohol use: No  . Drug use: No  . Sexual activity: Not on file  Other Topics Concern  . Not on file  Social History Narrative  . Not on file   Social Determinants of Health   Financial Resource Strain:   . Difficulty of Paying Living Expenses:   Food Insecurity:   . Worried About Charity fundraiser in the Last Year:   . Arboriculturist in the Last Year:   Transportation Needs:   . Film/video editor (Medical):   Marland Kitchen Lack of Transportation (Non-Medical):   Physical Activity:   . Days of Exercise per Week:   . Minutes of Exercise per Session:   Stress:   . Feeling of Stress :   Social Connections:   . Frequency of Communication with Friends and Family:   . Frequency of Social Gatherings with Friends and Family:   . Attends Religious Services:   . Active Member of Clubs or Organizations:   . Attends Archivist Meetings:   Marland Kitchen Marital Status:   Intimate Partner Violence:   . Fear of Current or Ex-Partner:   . Emotionally Abused:   Marland Kitchen Physically Abused:   . Sexually Abused:     No Known Allergies  Family History  Problem Relation Age of Onset  . Heart disease Father        Before age 69    Prior to Admission medications   Medication Sig Start Date End Date Taking? Authorizing Provider  aspirin 81 MG tablet Take 81 mg by mouth daily.   Yes [provider]  atorvastatin (LIPITOR) 20 MG tablet 1 tablet daily. 05/07/16  Yes [provider]  clotrimazole-betamethasone (LOTRISONE) cream Apply 1 application topically 2 (two) times daily as needed. fungal erythema of skin  folds. 01/23/20  Yes [provider]  feeding supplement, ENSURE ENLIVE, (ENSURE ENLIVE) LIQD Take 237 mLs by mouth 3 (three) times daily between meals. 02/19/20  Yes Thurnell Lose, MD  ferrous sulfate 325 (65 FE) MG tablet Take 1 tablet (325 mg total) by mouth 2 (two) times daily with a meal. 02/19/20 02/18/21 Yes Thurnell Lose, MD  fish oil-omega-3 fatty acids 1000 MG capsule Take 1 g  by mouth daily. 02/19/20  Yes [provider]  folic acid (FOLVITE) 1 MG tablet Take 1 mg by mouth daily. 01/23/20  Yes [provider]  furosemide (LASIX) 40 MG tablet Take 1 tablet (40 mg total) by mouth daily as needed for fluid or edema. 03/01/20  Yes Vassie Loll, MD  LINZESS 72 MCG capsule Take 72 mcg by mouth daily. 03/07/20  Yes [provider]  metoprolol tartrate (LOPRESSOR) 25 MG tablet Take 12.5 mg by mouth 2 (two) times daily with a meal.   Yes [provider]  niacin 500 MG tablet Take 500 mg by mouth daily with breakfast.   Yes [provider]  ondansetron (ZOFRAN) 4 MG tablet Take 4 mg by mouth every 8 (eight) hours as needed for nausea/vomiting. 01/27/20  Yes [provider]  mirtazapine (REMERON) 15 MG tablet Take 15 mg by mouth at bedtime. 03/20/20   [provider]  NON FORMULARY Diet: _____ Regular, ___x___ NAS, _______Consistent Carbohydrate, _______NPO _____Other 02/19/20   [provider]    Physical Exam: Vitals:   03/22/20 1152  BP: 123/60  Pulse: 86  Resp: 18  Temp: 97.7 F (36.5 C)  TempSrc: Oral  SpO2: 100%  Weight: 92.1 kg  Height: 6' (1.829 m)     . General:  Appears calm and comfortable and is NAD; somnolent but easily awakened and then goes back to sleep.  Appears pale and chronically ill. . Eyes:  PERRL, EOMI, normal lids, iris . ENT:  Extremely hard of hearing, grossly normal lips & tongue, mmm . Neck:  no LAD, masses or thyromegaly . Cardiovascular:  RRR, no m/r/g. 3+ LE edema.   Marland Kitchen Respiratory:   CTA bilaterally with no wheezes/rales/rhonchi.  Normal respiratory effort. . Abdomen:  soft, NT, ND, NABS . Skin:  no rash or induration seen on limited exam . Musculoskeletal:  Mildly atrophic tone BUE/BLE, good ROM, no bony abnormality . Psychiatric: blunted mood and affect, speech fluent and appropriate, AOx3 . Neurologic:  CN 2-12 grossly intact, moves all extremities in coordinated fashion    Radiological Exams on Admission: DG Chest 2 View  Result Date: 03/22/2020 CLINICAL DATA:  Suspected sepsis. EXAM: CHEST - 2 VIEW COMPARISON:  02/11/2020.  CT 01/23/2020. FINDINGS: Mediastinum and hilar structures normal. Heart size normal. Bilateral small of infiltrate and or atelectasis/scarring again noted. Similar findings noted on prior exam. New mild left mid lung field subsegmental atelectasis/infiltrate. IMPRESSION: 1. Bilateral small pulmonary densities consistent with mild infiltrates and or atelectasis/scarring again noted. 2. New mild left mid lung field subsegmental atelectasis/infiltrate. Electronically Signed   By: Maisie Fus  Register   On: 03/22/2020 13:15   CT HEAD WO CONTRAST  Result Date: 03/22/2020 CLINICAL DATA:  Left-sided facial droop EXAM: CT HEAD WITHOUT CONTRAST TECHNIQUE: Contiguous axial images were obtained from the base of the skull through the vertex without intravenous contrast. COMPARISON:  02/16/2009, 02/11/2020 FINDINGS: Brain: No evidence of acute infarction, hemorrhage, hydrocephalus, extra-axial collection or mass lesion/mass effect. Scattered low-density changes within the periventricular and subcortical white matter compatible with chronic microvascular ischemic change. Moderate diffuse cerebral volume loss. Vascular: Atherosclerotic calcifications involving the large vessels of the skull base. No unexpected hyperdense vessel. Skull: Normal. Negative for fracture or focal lesion. Sinuses/Orbits: Air-fluid level within the left sphenoid sinus and  scattered ethmoid air cell opacification. Orbital structures intact. Other: None. IMPRESSION: 1. No acute intracranial findings. 2. Chronic microvascular ischemic change and cerebral volume loss. 3. Air-fluid level within the left sphenoid sinus  and scattered ethmoid air cell opacification. Correlate for signs and symptoms of acute sinusitis. Electronically Signed   By: Duanne Guess D.O.   On: 03/22/2020 14:42    EKG: Independently reviewed.  NSR with rate 85; nonspecific ST changes with no evidence of acute ischemia   Labs on Admission: I have personally reviewed the available labs and imaging studies at the time of the admission.  Pertinent labs:   Na++ 127 Glucose 114 BUN 73/Creatinine 3.52/GFR 15; 30/1.39/47 on 5/28 Albumin 1.6 AST 71/ALT 69 Lactate 2.1 WBC 25.4; 14.2 on 5/28 Hgb 7.0; 8.9 on 5/28 Platelets 480 INR 1.2   Assessment/Plan Principal Problem:   Failure to thrive in adult Active Problems:   HTN (hypertension)   CAP (community acquired pneumonia)   Hyperlipidemia   Hyponatremia   Hypoalbuminemia due to protein-calorie malnutrition (HCC)   Leukocytosis   AKI (acute kidney injury) (HCC)   PMR (polymyalgia rheumatica) (HCC)    Failure to thrive -Patient with multiple hospitalizations since March -Progressive physical decline - possibly attributed to recurrent hospitalizations, decreased appetite with early satiety, and deconditioning -Will admit for further evaluation and treatment -Will request palliative care consultation for goals of care -Continue Remeron for now, but MD son-in-law has concerns about this  Symptomatic anemia with leukocytosis -Patient with prior recent admission for symptomatic anemia with heme positive stools -During that admission, he had EGD (4/23 with mild erosive reflux esophagitis, not explanative of anemia) and colonoscopy (also 4/23 with diverticula and polyps) -His anemia has worsened but he does not appear medically stable  enough for further endoscopic evaluation currently -Will check guaiac -Hgb 7.0, will transfuse 2 units and follow  -Patient counseled about short- and long-term risks associated with transfusion and consents to receive blood products. -Peripheral smear ordered previously with no apparent blood dyscrasia  PNA -Leukocytosis may be associated with infection -CXR not definitive and history is not overly consistent with infection -Will check procalcitonin -For now, will continue to cover with Rocephin/Azithromycin/Vanc; if MRSA screen is negative consider d/c of vanc  AKI -Very low albumin with third spacing -Given diuretics PTA -Very limited PO intake -Suspect that this led to intravascular depletion -Will start IVF -Will place TED hose to try to facilitate some improvement in edema  Deconditioning -PT/OT/ST consults -Albumin 1.6, poor PO intake, nutrition consult -Wife prefers to keep patient home if possible  Hyponatremia -Appears to be mildly worse from prior, likely associated with volume depletion -Unlikely to be contributing to current presentation -Will follow  Carotid occlusion -s/p B CEA -No evidence of restenosis on dopplers in 04/2018 -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  DVT prophylaxis:  SCDs Code Status:  DNR - confirmed with patient/family Family Communication:  Wife was present throughout evaluation; I also spoke with the patient's son-in-law (MD) by telephone at the time of admission. Disposition Plan:The patient is from: home             Anticipated d/c is to: home with palliative care vs. hospice             Anticipated d/c date will depend on clinical response to treatment, likely  several days             Patient is currently: acutely ill Consults  called: Palliative care Admission status:  Admit - It is my clinical opinion that admission to INPATIENT is reasonable and necessary because of the expectation that this patient will require hospital care that crosses at least 2 midnights to treat this condition based on the medical complexity of the problems presented.  Given the aforementioned information, the predictability of an adverse outcome is felt to be significant.    Jonah Blue MD [facility]   How to contact the [facility] Attending or Consulting provider 7A - 7P or covering provider during after hours 7P -7A, for this patient?  1. Check the care team in [facility] and look for a) attending/consulting [facility] provider listed and b) the [facility] team listed 2. Log into [email or web address] and use [facility] universal password to access. If you do not have the password, please contact the hospital operator. 3. Locate the [facility] provider you are looking for under [facility] and page to a number that you can be directly reached. 4. If you still have difficulty reaching the provider, please page the [facility] (Director on Call) for the Hospitalists listed on amion for assistance.   03/22/2020, 5:29 PM

## 2020-03-23 DIAGNOSIS — Z66 Do not resuscitate: Secondary | ICD-10-CM

## 2020-03-23 LAB — CBC WITH DIFFERENTIAL/PLATELET
Abs Immature Granulocytes: 0.33 10*3/uL — ABNORMAL HIGH (ref 0.00–0.07)
Basophils Absolute: 0.1 10*3/uL (ref 0.0–0.1)
Basophils Relative: 0 %
Eosinophils Absolute: 0.2 10*3/uL (ref 0.0–0.5)
Eosinophils Relative: 1 %
HCT: 26.9 % — ABNORMAL LOW (ref 39.0–52.0)
Hemoglobin: 8.7 g/dL — ABNORMAL LOW (ref 13.0–17.0)
Immature Granulocytes: 1 %
Lymphocytes Relative: 5 %
Lymphs Abs: 1.3 10*3/uL (ref 0.7–4.0)
MCH: 27.6 pg (ref 26.0–34.0)
MCHC: 32.3 g/dL (ref 30.0–36.0)
MCV: 85.4 fL (ref 80.0–100.0)
Monocytes Absolute: 1.2 10*3/uL — ABNORMAL HIGH (ref 0.1–1.0)
Monocytes Relative: 5 %
Neutro Abs: 20.9 10*3/uL — ABNORMAL HIGH (ref 1.7–7.7)
Neutrophils Relative %: 88 %
Platelets: 388 10*3/uL (ref 150–400)
RBC: 3.15 MIL/uL — ABNORMAL LOW (ref 4.22–5.81)
RDW: 15.9 % — ABNORMAL HIGH (ref 11.5–15.5)
WBC: 23.9 10*3/uL — ABNORMAL HIGH (ref 4.0–10.5)
nRBC: 0 % (ref 0.0–0.2)

## 2020-03-23 LAB — BASIC METABOLIC PANEL
Anion gap: 11 (ref 5–15)
BUN: 76 mg/dL — ABNORMAL HIGH (ref 8–23)
CO2: 25 mmol/L (ref 22–32)
Calcium: 7.9 mg/dL — ABNORMAL LOW (ref 8.9–10.3)
Chloride: 94 mmol/L — ABNORMAL LOW (ref 98–111)
Creatinine, Ser: 3.75 mg/dL — ABNORMAL HIGH (ref 0.61–1.24)
GFR calc Af Amer: 16 mL/min — ABNORMAL LOW (ref >60)
GFR calc non Af Amer: 14 mL/min — ABNORMAL LOW (ref >60)
Glucose, Bld: 107 mg/dL — ABNORMAL HIGH (ref 70–99)
Potassium: 4 mmol/L (ref 3.5–5.1)
Sodium: 130 mmol/L — ABNORMAL LOW (ref 135–145)

## 2020-03-23 LAB — STREP PNEUMONIAE URINARY ANTIGEN: Strep Pneumo Urinary Antigen: NEGATIVE

## 2020-03-23 LAB — URINALYSIS, ROUTINE W REFLEX MICROSCOPIC
Bacteria, UA: NONE SEEN
Bilirubin Urine: NEGATIVE
Glucose, UA: NEGATIVE mg/dL
Ketones, ur: NEGATIVE mg/dL
Leukocytes,Ua: NEGATIVE
Nitrite: NEGATIVE
Protein, ur: NEGATIVE mg/dL
Specific Gravity, Urine: 1.01 (ref 1.005–1.030)
pH: 7 (ref 5.0–8.0)

## 2020-03-23 LAB — MRSA PCR SCREENING: MRSA by PCR: NEGATIVE

## 2020-03-23 NOTE — Progress Notes (Signed)
PROGRESS NOTE    Jeffrey Ho  EPP:[id number] DOB: [birth year] DOA: 03/22/2020 PCP: Redmond School, MD   Brief Narrative:  Jeffrey Ho is a 85 y.o. male with medical history significant of HTN; HLD; carotid artery occlusion; and R eye blindness presenting with abnormal labs.  Since last hospitalization, he has had home therapy and HHN.  He has good days and bad days.  Yesterday was a pretty good day with therapy.  His legs have been swollen and they went to their doctor Wednesday; they were told to increase fluid pills from as needed to 2 daily.  Today, he has had no energy and is not eating.  He is having to force him to eat.  He will drink a smoothie but otherwise has no appetite and little PO intake.  On Wednesday, he was given Remeron (?) and he has taken it for 2 nights with no change.  His doctor on Wednesday ordered blood work and they called this AM and said he should come in to the ER.  He is SOB with exertion but this is unchanged.  He has occasional coughing spells.  His weight was 234 and went down to 190 but recently 203. He was last admitted from 5/25-28 for proctitis with stercoral colitis.  He was disimpacted in the ER and admitted with improvement.  He was DNR.  He was recommended for SNF and declined.  He has had 4 ER visits and 1 admission in the last 6 months.  He went to rehab about 9 days ago.  Previously, his family reported that "his energy is getting worse. Just moving to the bathroom is exhausting. He walks short distances and is wiped out. He stays nauseated. He is SOB now just from using the bedside commode. He eats small amounts of food. He felt better after hospital d/c and worse since".    Assessment & Plan:   Principal Problem:   Failure to thrive in adult Active Problems:   HTN (hypertension)   CAP (community acquired pneumonia)   Hyperlipidemia   Hyponatremia   Hypoalbuminemia due to protein-calorie malnutrition (HCC)   Leukocytosis   AKI (acute  kidney injury) ([place])   PMR (polymyalgia rheumatica) (HCC)   Acute symptomatic anemia, without overt source Cannot rule out chronic anemia of chronic disease given poor p.o. intake/failure to thrive -Previous hospitalization for symptomatic anemia with heme positive stool(EGD with mild erosive esophagitis colonoscopy with polyps and diverticula but no overt bleeding), family declines any recent black or bloody stool-we will continue to follow in the inpatient setting -Patient's anemia ongoing, at this point given poor p.o. intake and failure to thrive as below unclear if this is malnutrition or iron deficiency on top of likely chronic anemia of chronic disease -Status post 2 unit transfusion, repeat H&H WNL Hemoglobin & Hematocrit     Component Value Date/Time   HGB 8.7 (L) 03/23/2020 1129   HCT 26.9 (L) 03/23/2020 1129   Community acquired pneumonia, POA -Leukocytosis and moderately elevated procalcitonin may be associated with infection -CXR unremarkable but patient is markedly dehydrated making this less reliable -For now, will continue to cover with Rocephin/Azithromycin; MRSA negative DC Vanc  AKI, likely pre-renal in the setting of failure to thrive and poor PO intake -Very low albumin with third spacing consistent with limited PO intake -We discussed need for IV fluids and increased PO intake as tolerated - if worsening PO intake or intolerance we did discuss possible role of palliative care  Failure to  thrive -Patient with multiple hospitalizations since March with worsening PO intake -Progressive decline ongoing - discussed palliative care as above if no improvement after treatment of pneumonia -Continue Remeron for now, son has concerns about ongoing use  Deconditioning/ambulatory dysfunction -PT/OT/ST consults -Albumin 1.6, poor PO intake, nutrition consult -Wife prefers to keep patient home if possible  Hyponatremia, hypovolemic -Continue IV fluids  Carotid  occlusion, history of -s/p B CEA -No evidence of restenosis on dopplers in 04/2018 -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 -Continue ASA  HTN, essential -Continue Lopressor  HLD -Continue Lipitor -Hold fish oil due to limited inpatient efficacy  Recent stercoral ulcer with colitis -Continue Linzess  DVT prophylaxis:SCDs Code Status:DNR- confirmed with patient/family Family Communication:Wife was present throughout evaluation Status is: Inpatient  Dispo: The patient is from: Home              Anticipated d/c is to: TBD              Anticipated d/c date is: 72-96h pending clinical course              Patient currently NOT medically stable for discharge due to ongoing need for IV fluids, antibiotics and close monitoring  Consultants:   None   Procedures:   None planned  Antimicrobials:  Ceftriaxone/azithromycin Vancomycin discontinued   Subjective: Limited due to patient's mental status - no acute issues/events overnight.  Objective: Vitals:   03/23/20 0725 03/23/20 0849 03/23/20 0928 03/23/20 1554  BP: 130/66 129/66  138/68  Pulse: 92  91 89  Resp: 20 20  16   Temp: 97.9 F (36.6 C) 98.5 F (36.9 C)  98.6 F (37 C)  TempSrc:  Axillary    SpO2: 100% 99% 95% 99%  Weight:      Height:        Intake/Output Summary (Last 24 hours) at 03/23/2020 1607 Last data filed at 03/23/2020 0325 Gross per 24 hour  Intake 1532.5 ml  Output 250 ml  Net 1282.5 ml   Filed Weights   03/22/20 1152  Weight: 92.1 kg    Examination:  General:  Pleasantly resting in bed, No acute distress. Somnolent but arousable, oriented to person/situation HEENT:  Normocephalic atraumatic.  Sclerae nonicteric, noninjected.  Extraocular movements intact bilaterally. Neck:  Without mass or deformity.  Trachea is midline. Lungs:  Clear to auscultate bilaterally without rhonchi, wheeze, or rales. Heart:  Regular rate and rhythm.  Without murmurs, rubs, or gallops. Abdomen:   Soft, nontender, nondistended.  Without guarding or rebound. Extremities: Without cyanosis, clubbing, edema, or obvious deformity. Vascular:  Dorsalis pedis and posterior tibial pulses palpable bilaterally. Skin:  Warm and dry, no erythema, no ulcerations.   Data Reviewed: I have personally reviewed following labs and imaging studies  CBC: Recent Labs  Lab 03/22/20 1210 03/22/20 1238 03/22/20 1610 03/23/20 1129  WBC 25.4*  --   --  23.9*  NEUTROABS 22.9*  --   --  20.9*  HGB 7.0* 7.5* 6.8* 8.7*  HCT 23.1* 22.0* 20.0* 26.9*  MCV 87.2  --   --  85.4  PLT 480*  --   --  388   Basic Metabolic Panel: Recent Labs  Lab 03/22/20 1210 03/22/20 1238 03/22/20 1610 03/23/20 1129  NA 127* 128* 129* 130*  K 3.9 3.9 4.1 4.0  CL 90* 89*  --  94*  CO2 26  --   --  25  GLUCOSE 114* 114*  --  107*  BUN 73* 75*  --  76*  CREATININE 3.52* 4.00*  --  3.75*  CALCIUM 8.1*  --   --  7.9*   GFR: Estimated Creatinine Clearance: 16.4 mL/min (A) (by C-G formula based on SCr of 3.75 mg/dL (H)). Liver Function Tests: Recent Labs  Lab 03/22/20 1210  AST 71*  ALT 69*  ALKPHOS 112  BILITOT 0.5  PROT 6.5  ALBUMIN 1.6*   No results for input(s): LIPASE, AMYLASE in the last 168 hours. Recent Labs  Lab 03/22/20 1558  AMMONIA 12   Coagulation Profile: Recent Labs  Lab 03/22/20 1210  INR 1.2   Cardiac Enzymes: No results for input(s): CKTOTAL, CKMB, CKMBINDEX, TROPONINI in the last 168 hours. BNP (last 3 results) No results for input(s): PROBNP in the last 8760 hours. HbA1C: No results for input(s): HGBA1C in the last 72 hours. CBG: Recent Labs  Lab 03/22/20 1446  GLUCAP 106*   Lipid Profile: No results for input(s): CHOL, HDL, LDLCALC, TRIG, CHOLHDL, LDLDIRECT in the last 72 hours. Thyroid Function Tests: No results for input(s): TSH, T4TOTAL, FREET4, T3FREE, THYROIDAB in the last 72 hours. Anemia Panel: No results for input(s): VITAMINB12, FOLATE, FERRITIN, TIBC, IRON,  RETICCTPCT in the last 72 hours. Sepsis Labs: Recent Labs  Lab 03/22/20 1210 03/22/20 1558 03/22/20 2050  PROCALCITON  --   --  0.56  LATICACIDVEN 2.1* 1.7  --     Recent Results (from the past 240 hour(s))  Culture, blood (Routine x 2)     Status: None (Preliminary result)   Collection Time: 03/22/20 12:11 PM   Specimen: BLOOD RIGHT HAND  Result Value Ref Range Status   Specimen Description BLOOD RIGHT HAND  Final   Special Requests   Final    BOTTLES DRAWN AEROBIC AND ANAEROBIC Blood Culture adequate volume   Culture   Final    NO GROWTH < 24 HOURS Performed at [facility], [address] [place], [place] [address]    Report Status PENDING  Incomplete  SARS Coronavirus 2 by RT PCR (hospital order, performed in [facility] hospital lab) Nasopharyngeal Nasopharyngeal Swab     Status: None   Collection Time: 03/22/20  5:22 PM   Specimen: Nasopharyngeal Swab  Result Value Ref Range Status   SARS Coronavirus 2 NEGATIVE NEGATIVE Final    Comment: (NOTE) SARS-CoV-2 target nucleic acids are NOT DETECTED.  The SARS-CoV-2 RNA is generally detectable in upper and lower respiratory specimens during the acute phase of infection. The lowest concentration of SARS-CoV-2 viral copies this assay can detect is 250 copies / mL. A negative result does not preclude SARS-CoV-2 infection and should not be used as the sole basis for treatment or other patient management decisions.  A negative result may occur with improper specimen collection / handling, submission of specimen other than nasopharyngeal swab, presence of viral mutation(s) within the areas targeted by this assay, and inadequate number of viral copies (<250 copies / mL). A negative result must be combined with clinical observations, patient history, and epidemiological information.  Fact Sheet for Patients:   BoilerBrush.com.cyhttps://www.fda.gov/media/136312/download  Fact Sheet for Healthcare  Providers: https://pope.com/https://www.fda.gov/media/136313/download  This test is not yet approved or  cleared by the [place] FDA and has been authorized for detection and/or diagnosis of SARS-CoV-2 by FDA under an Emergency Use Authorization (EUA).  This EUA will remain in effect (meaning this test can be used) for the duration of the COVID-19 declaration under Section 564(b)(1) of the Act, 21 U.S.C. section 360bbb-3(b)(1), unless the authorization is terminated  or revoked sooner.  Performed at [facility], [address] [place], [place] [address]   Culture, blood (Routine x 2)     Status: None (Preliminary result)   Collection Time: 03/22/20  8:50 PM   Specimen: BLOOD LEFT ARM  Result Value Ref Range Status   Specimen Description BLOOD LEFT ARM  Final   Special Requests   Final    BOTTLES DRAWN AEROBIC AND ANAEROBIC Blood Culture adequate volume   Culture   Final    NO GROWTH < 12 HOURS Performed at [facility], [address] [place], [place] [address]    Report Status PENDING  Incomplete  MRSA PCR Screening     Status: None   Collection Time: 03/22/20 11:10 PM   Specimen: Nasal Mucosa; Nasopharyngeal  Result Value Ref Range Status   MRSA by PCR NEGATIVE NEGATIVE Final    Comment:        The GeneXpert MRSA Assay (FDA approved for NASAL specimens only), is one component of a comprehensive MRSA colonization surveillance program. It is not intended to diagnose MRSA infection nor to guide or monitor treatment for MRSA infections. Performed at [facility], [address], [place], [place] [address]          Radiology Studies: DG Chest 2 View  Result Date: 03/22/2020 CLINICAL DATA:  Suspected sepsis. EXAM: CHEST - 2 VIEW COMPARISON:  02/11/2020.  CT 01/23/2020. FINDINGS: Mediastinum and hilar structures normal. Heart size normal. Bilateral small of infiltrate and or atelectasis/scarring again noted. Similar findings noted on prior exam. New mild left  mid lung field subsegmental atelectasis/infiltrate. IMPRESSION: 1. Bilateral small pulmonary densities consistent with mild infiltrates and or atelectasis/scarring again noted. 2. New mild left mid lung field subsegmental atelectasis/infiltrate. Electronically Signed   By: Maisie Fus  Register   On: 03/22/2020 13:15   CT HEAD WO CONTRAST  Result Date: 03/22/2020 CLINICAL DATA:  Left-sided facial droop EXAM: CT HEAD WITHOUT CONTRAST TECHNIQUE: Contiguous axial images were obtained from the base of the skull through the vertex without intravenous contrast. COMPARISON:  02/16/2009, 02/11/2020 FINDINGS: Brain: No evidence of acute infarction, hemorrhage, hydrocephalus, extra-axial collection or mass lesion/mass effect. Scattered low-density changes within the periventricular and subcortical white matter compatible with chronic microvascular ischemic change. Moderate diffuse cerebral volume loss. Vascular: Atherosclerotic calcifications involving the large vessels of the skull base. No unexpected hyperdense vessel. Skull: Normal. Negative for fracture or focal lesion. Sinuses/Orbits: Air-fluid level within the left sphenoid sinus and scattered ethmoid air cell opacification. Orbital structures intact. Other: None. IMPRESSION: 1. No acute intracranial findings. 2. Chronic microvascular ischemic change and cerebral volume loss. 3. Air-fluid level within the left sphenoid sinus and scattered ethmoid air cell opacification. Correlate for signs and symptoms of acute sinusitis. Electronically Signed   By: Duanne Guess D.O.   On: 03/22/2020 14:42   Scheduled Meds: . sodium chloride   Intravenous Once  . aspirin EC  81 mg Oral Daily  . atorvastatin  20 mg Oral Daily  . feeding supplement (ENSURE ENLIVE)  237 mL Oral TID BM  . ferrous sulfate  325 mg Oral BID WC  . folic acid  1 mg Oral Daily  . linaclotide  72 mcg Oral Q0600  . metoprolol tartrate  12.5 mg Oral BID WC   Continuous Infusions: . sodium chloride 75  mL/hr at 03/23/20 0847  . azithromycin Stopped (03/22/20 2110)  . cefTRIAXone (ROCEPHIN)  IV 2 g (03/23/20 1509)  LOS: 1 day   Time spent:  Azucena Fallen, DO [facility]  If 7PM-7AM, please contact night-coverage [email or web address]  03/23/2020, 4:07 PM

## 2020-03-23 NOTE — Progress Notes (Signed)
OT Cancellation Note  Patient Details Name: Jeffrey Ho MRN: [id number] DOB: 1935/08/16   Cancelled Treatment:    Reason Eval/Treat Not Completed: Medical issues which prohibited therapy.  Patient has critically low HH level at this time.  Will hold therapy until more stable.   Barbie Banner, OTR/L  Adella Hare 03/23/2020, 8:59 AM

## 2020-03-23 NOTE — Evaluation (Signed)
Physical Therapy Evaluation Patient Details Name: Jeffrey Ho MRN: [id number] DOB: [birth year] Today's Date: 03/23/2020   History of Present Illness  Jeffrey Ho is a 85 y.o. male with medical history significant of HTN; HLD; carotid artery occlusion; and R eye blindness presenting with abnormal labs.  Patient with anemia, PNA, FTT and AKI.  Clinical Impression  Patient presents with dependencies in gait and mobility limited by generalize weakness, decreased balance and general fatigue.  Patient would benefit from continued PT to progress mobility and independence.  Patient with supportive family.  Uncertain if wife can handle him by herself if he continues to decline.    Follow Up Recommendations Home health PT    Equipment Recommendations  Rolling walker with 5" wheels    Recommendations for Other Services       Precautions / Restrictions Precautions Precautions: None      Mobility  Bed Mobility Overal bed mobility: Needs Assistance Bed Mobility: Supine to Sit;Sit to Supine     Supine to sit: Min assist Sit to supine: Min assist   General bed mobility comments: assist to lift shoulder on/off bed  Transfers Overall transfer level: Needs assistance Equipment used: Rolling walker (2 wheeled) Transfers: Sit to/from Stand Sit to Stand: Mod assist         General transfer comment: assist to power up  Ambulation/Gait Ambulation/Gait assistance: Min assist Gait Distance (Feet): 50 Feet Assistive device: Rolling walker (2 wheeled) Gait Pattern/deviations: Step-through pattern        Stairs            Wheelchair Mobility    Modified Rankin (Stroke Patients Only)       Balance Overall balance assessment: Needs assistance Sitting-balance support: No upper extremity supported;Feet supported Sitting balance-Leahy Scale: Fair     Standing balance support: Bilateral upper extremity supported;During functional activity Standing balance-Leahy Scale:  Poor Standing balance comment: reliant on RW for balance                             Pertinent Vitals/Pain Pain Assessment: No/denies pain    Home Living Family/patient expects to be discharged to:: Private residence Living Arrangements: Spouse/significant other Available Help at Discharge: Family Type of Home: House Home Access: Stairs to enter Entrance Stairs-Rails: Right;Left;Can reach both Entrance Stairs-Number of Steps: 2 Home Layout: One level;Able to live on main level with bedroom/bathroom Home Equipment: Walker - 4 wheels;Walker - 2 wheels;Cane - single point;Hand held shower head;Grab bars - tub/shower;Grab bars - toilet      Prior Function Level of Independence: Needs assistance   Gait / Transfers Assistance Needed: has been working with PT at home to increase mobility; uses RW in house and w/c outside of home  ADL's / Homemaking Assistance Needed: Wife assist recently due to progressive weakness        Hand Dominance        Extremity/Trunk Assessment   Upper Extremity Assessment Upper Extremity Assessment: Generalized weakness    Lower Extremity Assessment Lower Extremity Assessment: Generalized weakness       Communication   Communication: HOH  Cognition Arousal/Alertness: Awake/alert Behavior During Therapy: Flat affect Overall Cognitive Status: Within Functional Limits for tasks assessed                                        General Comments  Exercises     Assessment/Plan    PT Assessment Patient needs continued PT services  PT Problem List Decreased strength;Decreased activity tolerance;Decreased balance;Decreased mobility;Cardiopulmonary status limiting activity       PT Treatment Interventions DME instruction;Gait training;Functional mobility training;Balance training;Therapeutic exercise;Therapeutic activities;Patient/family education    PT Goals (Current goals can be found in the Care Plan section)   Acute Rehab PT Goals Patient Stated Goal: Family - take him home PT Goal Formulation: With patient/family Time For Goal Achievement: 04/06/20 Potential to Achieve Goals: Good    Frequency Min 3X/week   Barriers to discharge        Co-evaluation               AM-PAC PT "6 Clicks" Mobility  Outcome Measure Help needed turning from your back to your side while in a flat bed without using bedrails?: A Little Help needed moving from lying on your back to sitting on the side of a flat bed without using bedrails?: A Little Help needed moving to and from a bed to a chair (including a wheelchair)?: A Lot Help needed standing up from a chair using your arms (e.g., wheelchair or bedside chair)?: A Lot Help needed to walk in hospital room?: A Lot Help needed climbing 3-5 steps with a railing? : Total 6 Click Score: 3    End of Session Equipment Utilized During Treatment: Gait belt Activity Tolerance: Patient tolerated treatment well Patient left: in bed;with call bell/phone within reach;with family/visitor present;with bed alarm set   PT Visit Diagnosis: Unsteadiness on feet (R26.81);Other abnormalities of gait and mobility (R26.89);Muscle weakness (generalized) (M62.81)    Time: 1510-1540 PT Time Calculation (min) (ACUTE ONLY): 30 min   Charges:   PT Evaluation $PT Eval Moderate Complexity: 1 Mod PT Treatments $Gait Training: 8-22 mins       03/23/2020 Margie, PT Acute Rehabilitation Services Pager:  [phone] Office:  [phone]    Shanna Cisco 03/23/2020, 3:53 PM

## 2020-03-23 NOTE — Progress Notes (Signed)
Initial Nutrition Assessment  RD working remotely.  DOCUMENTATION CODES:   Not applicable  INTERVENTION:  Recommend liberalizing diet to regular.  Continue Ensure Enlive po TID, each supplement provides 350 kcal and 20 grams of protein.  Provide Magic cup TID with meals, each supplement provides 290 kcal and 9 grams of protein. Patient prefers orange.  NUTRITION DIAGNOSIS:   Inadequate oral intake related to decreased appetite, lethargy/confusion as evidenced by per patient/family report.  GOAL:   Patient will meet greater than or equal to 90% of their needs  MONITOR:   PO intake, Supplement acceptance, Labs, Weight trends, Skin, I & O's  REASON FOR ASSESSMENT:   Consult Assessment of nutrition requirement/status  ASSESSMENT:   85 year old male with PMHx of HLD, HTN, carotid artery occlusion, blindness in right eye who is admitted with failure to thrive, symptomatic anemia with leukocytosis, PNA, AKI.   Called into patient's room and spoke with his wife over the phone. Wife reports patient is too drowsy to talk on the phone at this time. She reports patient has had a decreased appetite and intake for several months now. She reports he has some good days where he can eat fairly well and then other days where he will only eat bites. The last several days he has only been eating bites of crackers, egg, yogurt, and also taking sips of Ensure. Patient's daughter also ordered him a case of Magic Cup for home and he enjoys those. Wife reports he has been too drowsy to eat so far today. She did not have menu in room so she went down to the cafeteria to get patient yogurt and blueberries since breakfast tray didn't arrive.   Patient's wife reports his UBW was 234 lbs. There is a gap in weight history in chart from 04/2018 to 01/01/2020. Patient was 102 kg on 01/01/2020. He lost down to 91.4 kg on 02/28/2020. That is a weight loss of 10.6 kg (10.4% body weight) over 2 months, which is  significant for time frame. She reports his lowest weight was around 190 lbs (86.4 kg). Wife reports she was able to get him to eat more and he did gain up to 203 lbs (92.1 kg) but that he has not yet been weighed this admission. Suspect weight of 203 lbs is falsely elevated from third spacing. Per RN documentation patient with deep pitting edema to bilateral lower extremities.  Medications reviewed and include: ferrous sulfate 119 mg BID, folic acid 1 mg daily, Linzess, NS at 75 mL/hr, azithromycin, ceftriaxone.  Labs reviewed: On 6/18 Na 127, Chloride 90, BUN 73, Creatinine 3.52.  Patient is at risk for malnutrition.  Discussed with RN over the phone. She will bring patient's wife menu for when he is alert enough to eat. Patient too lethargic at this time. Noted palliative medicine consult for goals of care.  NUTRITION - FOCUSED PHYSICAL EXAM:  Unable to complete at this time as RD is working remotely.  Diet Order:   Diet Order            Diet regular Room service appropriate? Yes; Fluid consistency: Thin  Diet effective now                EDUCATION NEEDS:   No education needs have been identified at this time  Skin:  Skin Assessment: Skin Integrity Issues: (MSAD to sacrum and buttocks)  Last BM:  03/22/2020 - small type 6  Height:   Ht Readings from Last 1 Encounters:  03/22/20 6' (1.829 m)   Weight:   Wt Readings from Last 1 Encounters:  03/22/20 92.1 kg   BMI:  Body mass index is 27.53 kg/m.  Estimated Nutritional Needs:   Kcal:  2000-2200  Protein:  100-110 grams  Fluid:  2 L/day  Felix Pacini, MS, RD, LDN Pager number available on Amion

## 2020-03-23 NOTE — Consult Note (Addendum)
Palliative Medicine Inpatient Consult Note  Reason for consult:  Goals of Care  HPI:  Per intake H&P --> Jeffrey Ho is a 85 y.o. male with medical history significant of right eye blindness, carotid artery occlusion, hard of hearing, hyperlipidemia, hypertension. He has had had five hospitalizations in the past six months. He has had ongoing failure to thrive and physical weakness.  Palliative care was asked to see Jeffrey Ho to further address goals of care.   Clinical Assessment/Goals of Care: I have reviewed medical records including EPIC notes, labs and imaging, received report from bedside RN, assessed the patient. Jeffrey Ho is lying in bed in NAD. He is very hard of hearing though able to respond appropriately. He is oriented to person, place, time, and situation.     I met with Jeffrey Ho and his wife, Jeffrey Ho to further discuss diagnosis prognosis, GOC, EOL wishes, disposition and options.   I introduced Palliative Medicine as specialized medical care for people living with serious illness. It focuses on providing relief from the symptoms and stress of a serious illness. The goal is to improve quality of life for both the patient and the family.  I asked Jeffrey Ho to tell me about himself. He shares that he is from [place], [place]. He has been married to his wife, Jeffrey Ho for twenty nine years, she is his second wife. He has two children from his prior relationship and Jeffrey Ho has two children from her prior relationship. 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 He use to enjoy taking walks, working in his shop, and doing work in on Museum/gallery exhibitions officer. He [place] a man of faith though is non-denominational.   Jeffrey Ho lives at home with his wife. Prior to hospitalization he was able to mobilize around his home with a walker. He received physical therapy twice a week and nursing care once a week.   In regarding to [facility] overall health his wife shares that he has been declining. He has had five  hospitalizations in the last few months. One of the more troubling symptoms to his wife is his lack of interest in eating. I asked Jeffrey Ho why he does not wish to eat. He states that he feels "sick and nauseated" all the time. Jeffrey Ho states that he is nauseated simply by the smell of food. She said that they are unable to get much protein in him. Jeffrey Ho is able to tolerate ensure, vegetables drinks (she makes him kale shakes), yogurt, and pudding. In the last six weeks his oral intake has dramatically reduced. Otherwise he endorses "I can't do nothing." He tried to complete daily exercises on his own accord but feels generally quite weakened.   A detailed discussion was had today regarding advanced directives, Jeffrey Ho shares that she does have these on file in her home. Jeffrey Ho would be Nutritional therapist if her were unable to make decisions for himself.    Concepts specific to code status, artifical feeding and hydration, continued IV antibiotics and rehospitalization was had. I completed a MOST form today. The patient and family outlined their wishes for the following treatment decisions:  Cardiopulmonary Resuscitation: Do Not Attempt Resuscitation (DNR/No CPR)  Medical Interventions: Limited Additional Interventions: Use medical treatment, IV fluids and cardiac monitoring as indicated, DO NOT USE intubation or mechanical ventilation. May consider use of less invasive airway support such as BiPAP or CPAP. Also provide comfort measures. Transfer to the hospital if indicated. Avoid intensive care.   Antibiotics: Determine use of limitation of  antibiotics when infection occurs  IV Fluids: IV fluids if indicated  Feeding Tube: No feeding tube   The difference between a aggressive medical intervention path  and a palliative comfort care path for this patient at this time was had. We discussed that if Jeffrey Ho condition continues to decline it may be reasonable to consider hospice care. Jeffrey Ho said that she  is hopeful for improvement but understands that if he declines further it would be a reasonable next step. We discussed that the goals of hospice is to preserve dignity and quality at the end of life.   Discussed the importance of continued conversation with family and their  medical providers regarding overall plan of care and treatment options, ensuring decisions are within the context of the patients values and GOCs.  Decision Maker: Patient is very hard of hearing though is capable of making decisions. His spouse, Jeffrey Ho would make decisions for him if her were ever incapacitated.   SUMMARY OF RECOMMENDATIONS   DNAR/DNI  MOST Completed, paper copy placed onto the chart electric copy can be found in the media section of epic  DNR Form Completed, paper copy placed onto the chart electric copy can be found in the media section of epic  Patient and his wife are hopeful for improvement, we discussed that if Jeffrey Ho decline continue hospice would be an appropriate consideration  TOC --> OP Palliative Care  Code Status/Advance Care Planning: DNAR/DNI   Symptom Management:  Failure to Thrive:  - Dietician consulted  - Encourage 1:1 feeding  - Supplemental nutrients  Muscular Weakness:                 - Physical Therapy Evaluation                 - Occupational Therapy Evaluation  Nausea:  - It would be reasonable to consider an antiemetic 30 minutes before meals though qtc is presently 445    Palliative Prophylaxis:   Turn Q2H, Oral Care, Constipation management  Additional Recommendations (Limitations, Scope, Preferences):  Continue current scope of care   Psycho-social/Spiritual:   Desire for further Chaplaincy support: No  Additional Recommendations: Education on hospice and palliative supportive services   Prognosis: Unclear though given recurrent hospitalization and ongoing failure to thrive he has poor likelihood for great improvement. His condition is quite  guarded.  Discharge Planning: Unclear at the present time though most likely home with [facility] and OP Palliative FU  PPS: 40%   This conversation/these recommendations were discussed with patient primary care team, Dr. Avon Gully  Time In:  1445 Time Out: 1600 Total Time: 75 Greater than 50%  of this time was spent counseling and coordinating care related to the above assessment and plan.  [place] Team Team Cell Phone: [phone] Please utilize secure chat with additional questions, if there is no response within 30 minutes please call the above phone number  Palliative Medicine Team providers are available by phone from 7am to 7pm daily and can be reached through the team cell phone.  Should this patient require assistance outside of these hours, please call the patient's attending physician.

## 2020-03-24 LAB — TYPE AND SCREEN
ABO/RH(D): A POS
Antibody Screen: NEGATIVE
Unit division: 0
Unit division: 0

## 2020-03-24 LAB — BPAM RBC
Blood Product Expiration Date: 202107082359
Blood Product Expiration Date: 202107082359
ISSUE DATE / TIME: 202106182323
ISSUE DATE / TIME: 202106190344
Unit Type and Rh: 6200
Unit Type and Rh: 6200

## 2020-03-24 LAB — CBC
HCT: 25.2 % — ABNORMAL LOW (ref 39.0–52.0)
Hemoglobin: 8.3 g/dL — ABNORMAL LOW (ref 13.0–17.0)
MCH: 27.6 pg (ref 26.0–34.0)
MCHC: 32.9 g/dL (ref 30.0–36.0)
MCV: 83.7 fL (ref 80.0–100.0)
Platelets: 454 10*3/uL — ABNORMAL HIGH (ref 150–400)
RBC: 3.01 MIL/uL — ABNORMAL LOW (ref 4.22–5.81)
RDW: 15.9 % — ABNORMAL HIGH (ref 11.5–15.5)
WBC: 24.8 10*3/uL — ABNORMAL HIGH (ref 4.0–10.5)
nRBC: 0 % (ref 0.0–0.2)

## 2020-03-24 LAB — COMPREHENSIVE METABOLIC PANEL
ALT: 49 U/L — ABNORMAL HIGH (ref 0–44)
AST: 53 U/L — ABNORMAL HIGH (ref 15–41)
Albumin: 1.3 g/dL — ABNORMAL LOW (ref 3.5–5.0)
Alkaline Phosphatase: 86 U/L (ref 38–126)
Anion gap: 11 (ref 5–15)
BUN: 83 mg/dL — ABNORMAL HIGH (ref 8–23)
CO2: 24 mmol/L (ref 22–32)
Calcium: 7.7 mg/dL — ABNORMAL LOW (ref 8.9–10.3)
Chloride: 95 mmol/L — ABNORMAL LOW (ref 98–111)
Creatinine, Ser: 3.8 mg/dL — ABNORMAL HIGH (ref 0.61–1.24)
GFR calc Af Amer: 16 mL/min — ABNORMAL LOW (ref >60)
GFR calc non Af Amer: 14 mL/min — ABNORMAL LOW (ref >60)
Glucose, Bld: 97 mg/dL (ref 70–99)
Potassium: 4.6 mmol/L (ref 3.5–5.1)
Sodium: 130 mmol/L — ABNORMAL LOW (ref 135–145)
Total Bilirubin: 0.6 mg/dL (ref 0.3–1.2)
Total Protein: 5.5 g/dL — ABNORMAL LOW (ref 6.5–8.1)

## 2020-03-24 MED ORDER — AZITHROMYCIN 250 MG PO TABS
500.0000 mg | ORAL_TABLET | Freq: Every day | ORAL | Status: AC
  Administered 2020-03-24 – 2020-03-26 (×3): 500 mg via ORAL
  Filled 2020-03-24 (×3): qty 2

## 2020-03-24 NOTE — Evaluation (Signed)
Occupational Therapy Evaluation Patient Details Name: Jeffrey Ho MRN: [id number] DOB: 12/31/1934 Today's Date: 03/24/2020    History of Present Illness Jeffrey Ho is a 86 y.o. male with medical history significant of HTN; HLD; carotid artery occlusion; and R eye blindness presenting with abnormal labs.  Patient with anemia, PNA, FTT and AKI.   Clinical Impression   PTA pt living with family, independent for BADL/IADL. At time of eval, pt completing bed mobility at min A and sit <> stands with min-mod A and RW. Pt attempted LB dressing when seated EOB, ultimately needing max A. Pt also frequently incontinent and needing max A to perform peri hygiene. Pt able to complete short in room distance with RW before fatiguing with DOE 3/4. He reports feeling increased fatigue and weakness this date. Given current status and family wishes, recommend HHOT at d/c for continued functional progression of BADL in home environment. Will continue to follow per POC listed below.    Follow Up Recommendations  Home health OT;Supervision/Assistance - 24 hour    Equipment Recommendations  None recommended by OT    Recommendations for Other Services       Precautions / Restrictions Precautions Precautions: Fall      Mobility Bed Mobility Overal bed mobility: Needs Assistance Bed Mobility: Supine to Sit;Sit to Supine           General bed mobility comments: min A to pull trunk into upright and scoot to EOB with feet flat on floor  Transfers Overall transfer level: Needs assistance Equipment used: Rolling walker (2 wheeled) Transfers: Sit to/from Stand Sit to Stand: Mod assist;Min assist         General transfer comment: min-mod A, pt quickly fatigues    Balance Overall balance assessment: Needs assistance Sitting-balance support: No upper extremity supported;Feet supported Sitting balance-Leahy Scale: Fair     Standing balance support: Bilateral upper extremity supported;During  functional activity Standing balance-Leahy Scale: Poor Standing balance comment: reliant on RW for balance                           ADL either performed or assessed with clinical judgement   ADL Overall ADL's : Needs assistance/impaired Eating/Feeding: Set up;Sitting   Grooming: Set up;Sitting   Upper Body Bathing: Minimal assistance;Sitting   Lower Body Bathing: Moderate assistance;Sit to/from stand   Upper Body Dressing : Minimal assistance;Sitting   Lower Body Dressing: Maximal assistance;Sitting/lateral leans;Sit to/from stand Lower Body Dressing Details (indicate cue type and reason): Pt with difficulty leaning over to feet and cannot maintain figure 4 method Toilet Transfer: Moderate assistance;Ambulation;RW Toilet Transfer Details (indicate cue type and reason): mod A for steady mobility with RW. Pt fatigues easily with uncontrolled descents Toileting- Clothing Manipulation and Hygiene: Maximal assistance;Sit to/from stand Toileting - Clothing Manipulation Details (indicate cue type and reason): incontinent with little awareness     Functional mobility during ADLs: Minimal assistance;Moderate assistance;Rolling walker;Cueing for safety;Cueing for sequencing       Vision Baseline Vision/History: Wears glasses Wears Glasses: At all times Patient Visual Report: No change from baseline       Perception     Praxis      Pertinent Vitals/Pain Pain Assessment: No/denies pain     Hand Dominance     Extremity/Trunk Assessment Upper Extremity Assessment Upper Extremity Assessment: Generalized weakness   Lower Extremity Assessment Lower Extremity Assessment: Generalized weakness       Communication Communication Communication: [facility]  Cognition Arousal/Alertness: Awake/alert Behavior During Therapy: Flat affect Overall Cognitive Status: Within Functional Limits for tasks assessed                                     General Comments        Exercises     Shoulder Instructions      Home Living Family/patient expects to be discharged to:: Private residence Living Arrangements: Spouse/significant other Available Help at Discharge: Family Type of Home: House Home Access: Stairs to enter [employer] of Steps: 2 Entrance Stairs-Rails: Right;Left;Can reach both Home Layout: One level;Able to live on main level with bedroom/bathroom     Bathroom Shower/Tub: Producer, television/film/video: Handicapped height Bathroom Accessibility: Yes How Accessible: Accessible via walker Home Equipment: Walker - 4 wheels;Walker - 2 wheels;Cane - single point;Hand held shower head;Grab bars - tub/shower;Grab bars - toilet          Prior Functioning/Environment Level of Independence: Needs assistance  Gait / Transfers Assistance Needed: has been working with PT at home to increase mobility; uses RW in house and w/c outside of home ADL's / Homemaking Assistance Needed: Wife assist recently due to progressive weakness            OT Problem List: Decreased strength;Decreased knowledge of use of DME or AE;Decreased activity tolerance;Cardiopulmonary status limiting activity;Impaired balance (sitting and/or standing)      OT Treatment/Interventions: Self-care/ADL training;Therapeutic exercise;Patient/family education;Balance training;Energy conservation;Therapeutic activities;DME and/or AE instruction    OT Goals(Current goals can be found in the care plan section) Acute Rehab OT Goals Patient Stated Goal: Family - take him home OT Goal Formulation: With patient Time For Goal Achievement: 04/07/20 Potential to Achieve Goals: Good  OT Frequency: Min 2X/week   Barriers to D/C:            Co-evaluation              AM-PAC OT "6 Clicks" Daily Activity     Outcome Measure Help from another person eating meals?: None Help from another person taking care of personal grooming?: None Help from another person  toileting, which includes using toliet, bedpan, or urinal?: A Lot Help from another person bathing (including washing, rinsing, drying)?: A Lot Help from another person to put on and taking off regular upper body clothing?: A Little Help from another person to put on and taking off regular lower body clothing?: A Lot 6 Click Score: 17   End of Session Equipment Utilized During Treatment: Gait belt;Rolling walker Nurse Communication: Mobility status  Activity Tolerance: Patient tolerated treatment well Patient left: in chair;with call bell/phone within reach;with chair alarm set;with family/visitor present  OT Visit Diagnosis: Unsteadiness on feet (R26.81);Other abnormalities of gait and mobility (R26.89);Muscle weakness (generalized) (M62.81)                Time: 1749-4496 OT Time Calculation (min): 34 min Charges:  OT General Charges $OT Visit: 1 Visit OT Evaluation $OT Eval Moderate Complexity: 1 Mod OT Treatments $Self Care/Home Management : 8-22 mins  Dalphine Handing, MSOT, OTR/L Acute Rehabilitation Services [facility] Office Number: [phone] Pager: [phone]  Dalphine Handing 03/24/2020, 4:30 PM

## 2020-03-24 NOTE — TOC Initial Note (Signed)
Transition of Care [facility]) - Initial/Assessment Note    Patient Details  Name: Jeffrey Ho MRN: [id number] Date of Birth: 09-12-1935  Transition of Care [facility]) CM/SW Contact:    Lawerance Sabal, RN Phone Number: 03/24/2020, 12:13 PM  Clinical Narrative:          Patient admitted from home w spouse. Active w [facility] for [facility] services. Spoke w wife, she declined palliative services at this time.           Expected Discharge Plan: Home w Home Health Services Barriers to Discharge: Continued Medical Work up   Patient Goals and CMS Choice Patient states their goals for this hospitalization and ongoing recovery are:: to return home w [facility] CMS Medicare.gov Compare Post Acute Care list provided to:: Other (Comment Required) Choice offered to / list presented to : Spouse  Expected Discharge Plan and Services Expected Discharge Plan: Home w Home Health Services   Discharge Planning Services: CM Consult Post Acute Care Choice: Home Health Living arrangements for the past 2 months: Single Family Home                                      Prior Living Arrangements/Services Living arrangements for the past 2 months: Single Family Home Lives with:: Spouse                   Activities of Daily Living Home Assistive Devices/Equipment: Hearing aid, Environmental consultant (specify type), Wheelchair, Medical laboratory scientific officer (specify quad or straight), Bedside commode/3-in-1 ADL Screening (condition at time of admission) Patient's cognitive ability adequate to safely complete daily activities?: Yes Is the patient deaf or have difficulty hearing?: Yes Does the patient have difficulty seeing, even when wearing glasses/contacts?: No Does the patient have difficulty concentrating, remembering, or making decisions?: No Patient able to express need for assistance with ADLs?: Yes Does the patient have difficulty dressing or bathing?: Yes Independently performs ADLs?: No Communication: Independent Dressing (OT): Needs  assistance Is this a change from baseline?: Pre-admission baseline Grooming: Needs assistance Is this a change from baseline?: Pre-admission baseline Feeding: Independent Bathing: Needs assistance Is this a change from baseline?: Pre-admission baseline Toileting: Needs assistance Is this a change from baseline?: Pre-admission baseline In/Out Bed: Needs assistance Is this a change from baseline?: Pre-admission baseline Walks in Home: Needs assistance Is this a change from baseline?: Pre-admission baseline Does the patient have difficulty walking or climbing stairs?: Yes Weakness of Legs: Both Weakness of Arms/Hands: Both  Permission Sought/Granted                  Emotional Assessment              Admission diagnosis:  Somnolence [R40.0] Hyponatremia [E87.1] AKI (acute kidney injury) (HCC) [N17.9] Patient Active Problem List   Diagnosis Date Noted  . Colitis   . Hypokalemia   . Proctitis 02/28/2020  . Chronic constipation 02/23/2020  . PMR (polymyalgia rheumatica) (HCC) 02/23/2020  . Bilateral lower extremity edema 02/23/2020  . AKI (acute kidney injury) (HCC) 02/21/2020  . Failure to thrive in adult 02/11/2020  . Tachypnea 02/11/2020  . Physical deconditioning 02/11/2020  . Weakness 01/24/2020  . Symptomatic anemia 01/23/2020  . Prolonged QT interval 01/23/2020  . Hyponatremia 01/23/2020  . Hypoalbuminemia due to protein-calorie malnutrition (HCC) 01/23/2020  . Leukocytosis 01/23/2020  . HTN (hypertension) 12/31/2019  . Carotid artery disease without cerebral infarction (HCC) 12/31/2019  . Cardiac ischemia  12/31/2019  . CAP (community acquired pneumonia) 12/31/2019  . Hyperlipidemia 12/31/2019  . Heme positive stool 12/31/2019  . Occlusion and stenosis of carotid artery without mention of cerebral infarction 08/22/2012   PCP:  Redmond School, MD Pharmacy:   [place], [place] - [place] [id number] [place]  [address] Phone: [phone] Fax: [phone]     Social Determinants of Health (SDOH) Interventions    Readmission Risk Interventions No flowsheet data found.

## 2020-03-24 NOTE — Progress Notes (Signed)
PROGRESS NOTE    Jeffrey Ho  ZOX:[id number] DOB: 09/09/1935 DOA: 03/22/2020 PCP: Elfredia NevinsFusco, Lawrence, MD   Brief Narrative:  Jeffrey Ho is a 85 y.o. male with medical history significant of HTN; HLD; carotid artery occlusion; and R eye blindness presenting with abnormal labs.  Since last hospitalization, he has had home therapy and HHN.  He has good days and bad days.  Yesterday was a pretty good day with therapy.  His legs have been swollen and they went to their doctor Wednesday; they were told to increase fluid pills from as needed to 2 daily.  Today, he has had no energy and is not eating.  He is having to force him to eat.  He will drink a smoothie but otherwise has no appetite and little PO intake.  On Wednesday, he was given Remeron (?) and he has taken it for 2 nights with no change.  His doctor on Wednesday ordered blood work and they called this AM and said he should come in to the ER.  He is SOB with exertion but this is unchanged.  He has occasional coughing spells.  His weight was 234 and went down to 190 but recently 203. He was last admitted from 5/25-28 for proctitis with stercoral colitis.  He was disimpacted in the ER and admitted with improvement.  He was DNR.  He was recommended for SNF and declined.  He has had 4 ER visits and 1 admission in the last 6 months.  He went to rehab about 9 days ago.  Previously, his family reported that "his energy is getting worse. Just moving to the bathroom is exhausting. He walks short distances and is wiped out. He stays nauseated. He is SOB now just from using the bedside commode. He eats small amounts of food. He felt better after hospital d/c and worse since".   Assessment & Plan:   Principal Problem:   Failure to thrive in adult Active Problems:   HTN (hypertension)   CAP (community acquired pneumonia)   Hyperlipidemia   Hyponatremia   Hypoalbuminemia due to protein-calorie malnutrition (HCC)   Leukocytosis   AKI (acute  kidney injury) (HCC)   PMR (polymyalgia rheumatica) (HCC)   Acute symptomatic anemia, without overt source Cannot rule out chronic anemia of chronic disease given poor p.o. intake/failure to thrive - Previous hospitalization for symptomatic anemia with heme positive stool (EGD with mild erosive esophagitis colonoscopy with polyps and diverticula but no overt bleeding), family declines any recent black or bloody stool-we will continue to follow in the inpatient setting - Patient's anemia ongoing, at this point given poor p.o. intake and failure to thrive as below unclear if this is malnutrition or iron deficiency on top of likely chronic anemia of chronic disease - Status post 2 unit transfusion, repeat H&H WNL but mildly downtrending CBC Latest Ref Rng & Units 03/24/2020 03/23/2020 03/22/2020  WBC 4.0 - 10.5 K/uL 24.8(H) 23.9(H) -  Hemoglobin 13.0 - 17.0 g/dL 8.3(L) 8.7(L) 6.8(LL)  Hematocrit 39 - 52 % 25.2(L) 26.9(L) 20.0(L)  Platelets 150 - 400 K/uL 454(H) 388 -   Community acquired pneumonia, POA - Leukocytosis and moderately elevated procalcitonin may be associated with infection - CXR unremarkable but patient is markedly dehydrated making this less reliable - For now, will continue to cover with Rocephin/Azithromycin; MRSA negative DC Vanc  Acute metabolic encephalopathy, POA resolving  - Likely multifactorial in etiology given concern for pneumonia, symptomatic anemia as well as elevated creatinine and uremia -  IV fluids and antibiotics patient's mental status continues to improve - Advance diet as tolerated  AKI, likely pre-renal in the setting of failure to thrive and poor PO intake - Very low albumin with third spacing consistent with limited PO intake - We discussed need for IV fluids and increased PO intake as tolerated - if worsening PO intake or intolerance we did discuss possible role of palliative care Lab Results  Component Value Date   CREATININE 3.80 (H) 03/24/2020    CREATININE 3.75 (H) 03/23/2020   CREATININE 4.00 (H) 03/22/2020   Failure to thrive - Patient with multiple hospitalizations since March with worsening PO intake - Progressive decline ongoing - discussed palliative care as above if no improvement after treatment of pneumonia - Continue Remeron for now, son has concerns about ongoing use  Deconditioning/ambulatory dysfunction - PT/OT/ST consults - Albumin 1.6, poor PO intake, nutrition consult - Wife prefers to keep patient home if possible  Hyponatremia, hypovolemic - Continue IV fluids  Carotid occlusion, history of - s/p B CEA - No evidence of restenosis on dopplers in 04/2018 - 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 - Continue ASA  HTN, essential - Continue Lopressor  HLD - Continue Lipitor - Hold fish oil  Recent stercoral ulcer with colitis - Continue Linzess  DVT prophylaxis:SCDs Code Status:DNR- confirmed with patient/family Family Communication:Wife was present throughout evaluation Status is: Inpatient  Dispo: The patient is from: Home              Anticipated d/c is to: TBD              Anticipated d/c date is: 72-96h pending clinical course              Patient currently NOT medically stable for discharge due to ongoing need for IV fluids, antibiotics and close monitoring  Consultants:   None   Procedures:   None planned  Antimicrobials:  Ceftriaxone/azithromycin Vancomycin discontinued   Subjective: No acute issues or events overnight, patient's mental status is now improving, denies nausea, vomiting, diarrhea, constipation, headache, fevers, chills.  Objective: Vitals:   03/23/20 0928 03/23/20 1554 03/23/20 2041 03/24/20 0819  BP:  138/68 140/68 (!) 153/71  Pulse: 91 89 91 95  Resp:  16 14 16   Temp:  98.6 F (37 C) 98.6 F (37 C) 98 F (36.7 C)  TempSrc:   Oral   SpO2: 95% 99% 100% 98%  Weight:      Height:        Intake/Output Summary (Last 24 hours) at 03/24/2020  1138 Last data filed at 03/24/2020 0600 Gross per 24 hour  Intake 1140 ml  Output 800 ml  Net 340 ml   Filed Weights   03/22/20 1152  Weight: 92.1 kg    Examination:  General:  Pleasantly resting in bed, No acute distress.  A/O x4 HEENT:  Normocephalic atraumatic.  Sclerae nonicteric, noninjected.  Extraocular movements intact bilaterally. Neck:  Without mass or deformity.  Trachea is midline. Lungs:  Clear to auscultate bilaterally without rhonchi, wheeze, or rales. Heart:  Regular rate and rhythm.  Without murmurs, rubs, or gallops. Abdomen:  Soft, nontender, nondistended.  Without guarding or rebound. Extremities: Without cyanosis, clubbing, edema, or obvious deformity. Vascular:  Dorsalis pedis and posterior tibial pulses palpable bilaterally. Skin:  Warm and dry, no erythema, no ulcerations.   Data Reviewed: I have personally reviewed following labs and imaging studies  CBC: Recent Labs  Lab 03/22/20 1210 03/22/20 1238 03/22/20 1610 03/23/20  1129 03/24/20 0626  WBC 25.4*  --   --  23.9* 24.8*  NEUTROABS 22.9*  --   --  20.9*  --   HGB 7.0* 7.5* 6.8* 8.7* 8.3*  HCT 23.1* 22.0* 20.0* 26.9* 25.2*  MCV 87.2  --   --  85.4 83.7  PLT 480*  --   --  388 454*   Basic Metabolic Panel: Recent Labs  Lab 03/22/20 1210 03/22/20 1238 03/22/20 1610 03/23/20 1129 03/24/20 0626  NA 127* 128* 129* 130* 130*  K 3.9 3.9 4.1 4.0 4.6  CL 90* 89*  --  94* 95*  CO2 26  --   --  25 24  GLUCOSE 114* 114*  --  107* 97  BUN 73* 75*  --  76* 83*  CREATININE 3.52* 4.00*  --  3.75* 3.80*  CALCIUM 8.1*  --   --  7.9* 7.7*   GFR: Estimated Creatinine Clearance: 16.2 mL/min (A) (by C-G formula based on SCr of 3.8 mg/dL (H)). Liver Function Tests: Recent Labs  Lab 03/22/20 1210 03/24/20 0626  AST 71* 53*  ALT 69* 49*  ALKPHOS 112 86  BILITOT 0.5 0.6  PROT 6.5 5.5*  ALBUMIN 1.6* 1.3*   No results for input(s): LIPASE, AMYLASE in the last 168 hours. Recent Labs  Lab  03/22/20 1558  AMMONIA 12   Coagulation Profile: Recent Labs  Lab 03/22/20 1210  INR 1.2   Cardiac Enzymes: No results for input(s): CKTOTAL, CKMB, CKMBINDEX, TROPONINI in the last 168 hours. BNP (last 3 results) No results for input(s): PROBNP in the last 8760 hours. HbA1C: No results for input(s): HGBA1C in the last 72 hours. CBG: Recent Labs  Lab 03/22/20 1446  GLUCAP 106*   Lipid Profile: No results for input(s): CHOL, HDL, LDLCALC, TRIG, CHOLHDL, LDLDIRECT in the last 72 hours. Thyroid Function Tests: No results for input(s): TSH, T4TOTAL, FREET4, T3FREE, THYROIDAB in the last 72 hours. Anemia Panel: No results for input(s): VITAMINB12, FOLATE, FERRITIN, TIBC, IRON, RETICCTPCT in the last 72 hours. Sepsis Labs: Recent Labs  Lab 03/22/20 1210 03/22/20 1558 03/22/20 2050  PROCALCITON  --   --  0.56  LATICACIDVEN 2.1* 1.7  --     Recent Results (from the past 240 hour(s))  Culture, blood (Routine x 2)     Status: None (Preliminary result)   Collection Time: 03/22/20 12:11 PM   Specimen: BLOOD RIGHT HAND  Result Value Ref Range Status   Specimen Description BLOOD RIGHT HAND  Final   Special Requests   Final    BOTTLES DRAWN AEROBIC AND ANAEROBIC Blood Culture adequate volume   Culture   Final    NO GROWTH 2 DAYS Performed at [facility], [address], [place], [place] [address]    Report Status PENDING  Incomplete  SARS Coronavirus 2 by RT PCR (hospital order, performed in [facility] hospital lab) Nasopharyngeal Nasopharyngeal Swab     Status: None   Collection Time: 03/22/20  5:22 PM   Specimen: Nasopharyngeal Swab  Result Value Ref Range Status   SARS Coronavirus 2 NEGATIVE NEGATIVE Final    Comment: (NOTE) SARS-CoV-2 target nucleic acids are NOT DETECTED.  The SARS-CoV-2 RNA is generally detectable in upper and lower respiratory specimens during the acute phase of infection. The lowest concentration of SARS-CoV-2 viral copies this assay can  detect is 250 copies / mL. A negative result does not preclude SARS-CoV-2 infection and should not be used as the sole basis for treatment  or other patient management decisions.  A negative result may occur with improper specimen collection / handling, submission of specimen other than nasopharyngeal swab, presence of viral mutation(s) within the areas targeted by this assay, and inadequate number of viral copies (<250 copies / mL). A negative result must be combined with clinical observations, patient history, and epidemiological information.  Fact Sheet for Patients:   BoilerBrush.com.cy  Fact Sheet for Healthcare Providers: https://pope.com/  This test is not yet approved or  cleared by the [place] FDA and has been authorized for detection and/or diagnosis of SARS-CoV-2 by FDA under an Emergency Use Authorization (EUA).  This EUA will remain in effect (meaning this test can be used) for the duration of the COVID-19 declaration under Section 564(b)(1) of the Act, 21 U.S.C. section 360bbb-3(b)(1), unless the authorization is terminated or revoked sooner.  Performed at [facility], [address] [place], [place] [address]   Culture, blood (Routine x 2)     Status: None (Preliminary result)   Collection Time: 03/22/20  8:50 PM   Specimen: BLOOD LEFT ARM  Result Value Ref Range Status   Specimen Description BLOOD LEFT ARM  Final   Special Requests   Final    BOTTLES DRAWN AEROBIC AND ANAEROBIC Blood Culture adequate volume   Culture   Final    NO GROWTH 2 DAYS Performed at [facility], [address] [place], [place] [address]    Report Status PENDING  Incomplete  MRSA PCR Screening     Status: None   Collection Time: 03/22/20 11:10 PM   Specimen: Nasal Mucosa; Nasopharyngeal  Result Value Ref Range Status   MRSA by PCR NEGATIVE NEGATIVE Final    Comment:        The GeneXpert MRSA Assay (FDA approved for  NASAL specimens only), is one component of a comprehensive MRSA colonization surveillance program. It is not intended to diagnose MRSA infection nor to guide or monitor treatment for MRSA infections. Performed at [facility], [address], [place], [place] [address]          Radiology Studies: DG Chest 2 View  Result Date: 03/22/2020 CLINICAL DATA:  Suspected sepsis. EXAM: CHEST - 2 VIEW COMPARISON:  02/11/2020.  CT 01/23/2020. FINDINGS: Mediastinum and hilar structures normal. Heart size normal. Bilateral small of infiltrate and or atelectasis/scarring again noted. Similar findings noted on prior exam. New mild left mid lung field subsegmental atelectasis/infiltrate. IMPRESSION: 1. Bilateral small pulmonary densities consistent with mild infiltrates and or atelectasis/scarring again noted. 2. New mild left mid lung field subsegmental atelectasis/infiltrate. Electronically Signed   By: Maisie Fus  Register   On: 03/22/2020 13:15   CT HEAD WO CONTRAST  Result Date: 03/22/2020 CLINICAL DATA:  Left-sided facial droop EXAM: CT HEAD WITHOUT CONTRAST TECHNIQUE: Contiguous axial images were obtained from the base of the skull through the vertex without intravenous contrast. COMPARISON:  02/16/2009, 02/11/2020 FINDINGS: Brain: No evidence of acute infarction, hemorrhage, hydrocephalus, extra-axial collection or mass lesion/mass effect. Scattered low-density changes within the periventricular and subcortical white matter compatible with chronic microvascular ischemic change. Moderate diffuse cerebral volume loss. Vascular: Atherosclerotic calcifications involving the large vessels of the skull base. No unexpected hyperdense vessel. Skull: Normal. Negative for fracture or focal lesion. Sinuses/Orbits: Air-fluid level within the left sphenoid sinus and scattered ethmoid air cell opacification. Orbital structures intact. Other: None. IMPRESSION: 1. No acute intracranial findings. 2. Chronic  microvascular ischemic change and cerebral volume loss. 3. Air-fluid level within  the left sphenoid sinus and scattered ethmoid air cell opacification. Correlate for signs and symptoms of acute sinusitis. Electronically Signed   By: Duanne Guess D.O.   On: 03/22/2020 14:42   Scheduled Meds:  sodium chloride   Intravenous Once   aspirin EC  81 mg Oral Daily   atorvastatin  20 mg Oral Daily   feeding supplement (ENSURE ENLIVE)  237 mL Oral TID BM   ferrous sulfate  325 mg Oral BID WC   folic acid  1 mg Oral Daily   linaclotide  72 mcg Oral Q0600   metoprolol tartrate  12.5 mg Oral BID WC   Continuous Infusions:  sodium chloride 75 mL/hr at 03/24/20 0742   azithromycin 500 mg (03/23/20 1714)   cefTRIAXone (ROCEPHIN)  IV 2 g (03/23/20 1509)     LOS: 2 days   Time spent:  Azucena Fallen, DO [facility]  If 7PM-7AM, please contact night-coverage [email or web address]  03/24/2020, 11:38 AM

## 2020-03-25 LAB — COMPREHENSIVE METABOLIC PANEL
ALT: 77 U/L — ABNORMAL HIGH (ref 0–44)
AST: 111 U/L — ABNORMAL HIGH (ref 15–41)
Albumin: 1.2 g/dL — ABNORMAL LOW (ref 3.5–5.0)
Alkaline Phosphatase: 112 U/L (ref 38–126)
Anion gap: 11 (ref 5–15)
BUN: 87 mg/dL — ABNORMAL HIGH (ref 8–23)
CO2: 21 mmol/L — ABNORMAL LOW (ref 22–32)
Calcium: 7.6 mg/dL — ABNORMAL LOW (ref 8.9–10.3)
Chloride: 96 mmol/L — ABNORMAL LOW (ref 98–111)
Creatinine, Ser: 4.03 mg/dL — ABNORMAL HIGH (ref 0.61–1.24)
GFR calc Af Amer: 15 mL/min — ABNORMAL LOW (ref >60)
GFR calc non Af Amer: 13 mL/min — ABNORMAL LOW (ref >60)
Glucose, Bld: 121 mg/dL — ABNORMAL HIGH (ref 70–99)
Potassium: 4.3 mmol/L (ref 3.5–5.1)
Sodium: 128 mmol/L — ABNORMAL LOW (ref 135–145)
Total Bilirubin: 0.6 mg/dL (ref 0.3–1.2)
Total Protein: 5.3 g/dL — ABNORMAL LOW (ref 6.5–8.1)

## 2020-03-25 LAB — CBC
HCT: 24 % — ABNORMAL LOW (ref 39.0–52.0)
Hemoglobin: 7.7 g/dL — ABNORMAL LOW (ref 13.0–17.0)
MCH: 27 pg (ref 26.0–34.0)
MCHC: 32.1 g/dL (ref 30.0–36.0)
MCV: 84.2 fL (ref 80.0–100.0)
Platelets: 331 10*3/uL (ref 150–400)
RBC: 2.85 MIL/uL — ABNORMAL LOW (ref 4.22–5.81)
RDW: 15.9 % — ABNORMAL HIGH (ref 11.5–15.5)
WBC: 24.6 10*3/uL — ABNORMAL HIGH (ref 4.0–10.5)
nRBC: 0 % (ref 0.0–0.2)

## 2020-03-25 MED ORDER — PRO-STAT SUGAR FREE PO LIQD
30.0000 mL | Freq: Two times a day (BID) | ORAL | Status: DC
  Administered 2020-03-25 – 2020-03-29 (×6): 30 mL via ORAL
  Filled 2020-03-25 (×6): qty 30

## 2020-03-25 MED ORDER — ALBUMIN HUMAN 25 % IV SOLN
25.0000 g | Freq: Once | INTRAVENOUS | Status: AC
  Administered 2020-03-25: 25 g via INTRAVENOUS
  Filled 2020-03-25: qty 100

## 2020-03-25 MED ORDER — ALBUMIN HUMAN 25 % IV SOLN
25.0000 g | Freq: Four times a day (QID) | INTRAVENOUS | Status: DC
  Administered 2020-03-25: 12.5 g via INTRAVENOUS
  Filled 2020-03-25: qty 100

## 2020-03-25 NOTE — Progress Notes (Signed)
Pt had two moderate episodes of dark brown loose stools.

## 2020-03-25 NOTE — Progress Notes (Signed)
Nutrition Follow-up  DOCUMENTATION CODES:   Not applicable  INTERVENTION:  95ml Pro-stat po BID, each supplement provides 100 kcal and 15 grams of protein   Continue Ensure Enlive po TID, each supplement provides 350 kcal and 20 grams of protein  Continue Magic cup TID with meals, each supplement provides 290 kcal and 9 grams of protein  Continue with liberalized diet  NUTRITION DIAGNOSIS:   Inadequate oral intake related to decreased appetite, lethargy/confusion as evidenced by per patient/family report.  Ongoing.  GOAL:   Patient will meet greater than or equal to 90% of their needs  Progressing.   MONITOR:   PO intake, Supplement acceptance, Labs, Weight trends, Skin, I & O's  REASON FOR ASSESSMENT:   Consult Assessment of nutrition requirement/status  ASSESSMENT:   85 year old male with PMHx of HLD, HTN, carotid artery occlusion, blindness in right eye who is admitted with failure to thrive, symptomatic anemia with leukocytosis, PNA, AKI.  Patient unavailable at time of RD visit.   Per Palliative Care, pt does not desire a feeding tube.   Per MD, will start albumin today to improve UOP/3rd spacing.   PO Intake: 55-90% x 3 recorded meals (73% average meal intake)  Labs: Na 128 (L) Medications: Ensure Enlive TID, Ferrous sulfate, Folvite  UOP: x24 hours I/O: +1,537.3ml since admit   Diet Order:   Diet Order            Diet regular Room service appropriate? Yes; Fluid consistency: Thin  Diet effective now                 EDUCATION NEEDS:   No education needs have been identified at this time  Skin:  Skin Assessment: Skin Integrity Issues: Skin Integrity Issues:: Other (Comment) Other: MASD buttocks, sacrum  Last BM:  6/19 type 4  Height:   Ht Readings from Last 1 Encounters:  03/22/20 6' (1.829 m)    Weight:   Wt Readings from Last 1 Encounters:  03/22/20 92.1 kg    BMI:  Body mass index is 27.53 kg/m.  Estimated  Nutritional Needs:   Kcal:  2000-2200  Protein:  100-110 grams  Fluid:  2 L/day    Eugene Gavia, MS, RD, LDN RD pager number and weekend/on-call pager number located in Amion.

## 2020-03-25 NOTE — Progress Notes (Signed)
PROGRESS NOTE    JDEN WANT  TIW:[id number] DOB: April 13, 1935 DOA: 03/22/2020 PCP: [name], MD   Brief Narrative:  Jeffrey Ho is a 85 y.o. male with medical history significant of HTN; HLD; carotid artery occlusion; and R eye blindness presenting with abnormal labs.  Since last hospitalization, he has had home therapy and HHRN.  He has good days and bad days.  Yesterday was a pretty good day with therapy.  His legs have been swollen and they went to their doctor Wednesday; they were told to increase fluid pills from as needed to 2 daily.  Today, he has had no energy and is not eating.  He is having to force him to eat.  He will drink a smoothie but otherwise has no appetite and little PO intake.  On Wednesday, he was given Remeron (?) and he has taken it for 2 nights with no change.  His doctor on Wednesday ordered blood work and they called this AM and said he should come in to the ER.  He is SOB with exertion but this is unchanged.  He has occasional coughing spells.  His weight was 234 and went down to 190 but recently 203. He was last admitted from 5/25-28 for proctitis with stercoral colitis.  He was disimpacted in the ER and admitted with improvement.  He was DNR.  He was recommended for SNF and declined.  He has had 4 ER visits and 1 admission in the last 6 months.  He went to rehab about 9 days ago.  Previously, his family reported that "his energy is getting worse. Just moving to the bathroom is exhausting. He walks short distances and is wiped out. He stays nauseated. He is SOB now just from using the bedside commode. He eats small amounts of food. He felt better after hospital d/c and worse since".   Assessment & Plan:   Principal Problem:   Failure to thrive in adult Active Problems:   HTN (hypertension)   CAP (community acquired pneumonia)   Hyperlipidemia   Hyponatremia   Hypoalbuminemia due to protein-calorie malnutrition (HCC)   Leukocytosis   AKI (acute  kidney injury) (HCC)   PMR (polymyalgia rheumatica) (HCC)  Acute symptomatic anemia, without overt source Cannot rule out chronic anemia of chronic disease given poor p.o. intake/failure to thrive - Previous hospitalization for symptomatic anemia with heme positive stool (EGD with mild erosive esophagitis colonoscopy with polyps and diverticula but no overt bleeding), family declines any recent black or bloody stool-we will continue to follow in the inpatient setting -Hemoccult pending - Patient's anemia ongoing, at this point given poor p.o. intake and failure to thrive as below unclear if this is malnutrition or iron deficiency on top of likely chronic anemia of chronic disease - Status post 2 unit transfusion, repeat H&H WNL but mildly downtrending CBC Latest Ref Rng & Units 03/25/2020 03/24/2020 03/23/2020  WBC 4.0 - 10.5 K/uL 24.6(H) 24.8(H) 23.9(H)  Hemoglobin 13.0 - 17.0 g/dL 7.7(L) 8.3(L) 8.7(L)  Hematocrit 39 - 52 % 24.0(L) 25.2(L) 26.9(L)  Platelets 150 - 400 K/uL 331 454(H) 388   Community acquired pneumonia, POA - Leukocytosis and moderately elevated procalcitonin may be associated with infection - CXR unremarkable but patient is markedly dehydrated making this less reliable - For now, will continue to cover with Rocephin/Azithromycin; MRSA negative DC Vanc  Acute metabolic encephalopathy, POA resolving  - Likely multifactorial in etiology given concern for pneumonia, symptomatic anemia as well as elevated creatinine and uremia -  IV fluids and antibiotics patient's mental status continues to improve - Advance diet as tolerated  AKI, likely pre-renal in the setting of failure to thrive and poor PO intake - Very low albumin with third spacing consistent with limited PO intake - We discussed need for ongoing IV fluids and increased PO intake as tolerated - Start albumin to help with volume status - If worsening PO intake or intolerance we did discuss possible role of palliative  care - unlikely candidate for HD; hold off on nephrology consult Lab Results  Component Value Date   CREATININE 4.03 (H) 03/25/2020   CREATININE 3.80 (H) 03/24/2020   CREATININE 3.75 (H) 03/23/2020   Failure to thrive - Patient with multiple hospitalizations since March with worsening PO intake - Progressive decline ongoing - discussed palliative care as above if no improvement after treatment of pneumonia - Continue Remeron for now, son has concerns about ongoing use  Deconditioning/ambulatory dysfunction - PT/OT/ST consults - Albumin 1.6, poor PO intake, nutrition consult - attempt albumin today to improve UOP/3rd spacing - Wife prefers to keep patient home if possible  Hyponatremia, hypovolemic - Continue IV fluids  Carotid occlusion, history of - s/p B CEA - No evidence of restenosis on dopplers in 04/2018 - 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 - Continue ASA  HTN, essential - Continue Lopressor  HLD - Continue Lipitor - Hold fish oil  Recent stercoral ulcer with colitis - Continue Linzess  DVT prophylaxis:SCDs Code Status:DNR- confirmed with patient/family Family Communication:Wife was present throughout evaluation Status is: Inpatient  Dispo: The patient is from: Home              Anticipated d/c is to: TBD              Anticipated d/c date is: 72-96h pending clinical course              Patient currently NOT medically stable for discharge due to ongoing need for IV fluids, antibiotics and close monitoring  Consultants:   None   Procedures:   None planned  Antimicrobials:  Ceftriaxone/azithromycin Vancomycin discontinued   Subjective: No acute issues or events overnight, denies nausea, vomiting, diarrhea, constipation, headache, fevers, chills.  Objective: Vitals:   03/23/20 2041 03/24/20 0819 03/24/20 1727 03/24/20 2124  BP: 140/68 (!) 153/71 (!) 157/76 (!) 158/92  Pulse: 91 95 95 (!) 107  Resp: 14 16 16 20   Temp: 98.6 F  (37 C) 98 F (36.7 C) 97.9 F (36.6 C) 97.9 F (36.6 C)  TempSrc: Oral   Oral  SpO2: 100% 98% 93% 99%  Weight:      Height:        Intake/Output Summary (Last 24 hours) at 03/25/2020 0719 Last data filed at 03/24/2020 1900 Gross per 24 hour  Intake --  Output 400 ml  Net -400 ml   Filed Weights   03/22/20 1152  Weight: 92.1 kg    Examination:  General:  Pleasantly resting in bed, No acute distress.  A/O x4 HEENT:  Normocephalic atraumatic.  Sclerae nonicteric, noninjected.  Extraocular movements intact bilaterally. Neck:  Without mass or deformity.  Trachea is midline. Lungs:  Clear to auscultate bilaterally without rhonchi, wheeze, or rales. Heart:  Regular rate and rhythm.  Without murmurs, rubs, or gallops. Abdomen:  Soft, nontender, nondistended.  Without guarding or rebound. Extremities: Without cyanosis, clubbing, edema, or obvious deformity. Vascular:  Dorsalis pedis and posterior tibial pulses palpable bilaterally. Skin:  Warm and dry, no erythema, no ulcerations.  Data Reviewed: I have personally reviewed following labs and imaging studies  CBC: Recent Labs  Lab 03/22/20 1210 03/22/20 1210 03/22/20 1238 03/22/20 1610 03/23/20 1129 03/24/20 0626 03/25/20 0357  WBC 25.4*  --   --   --  23.9* 24.8* 24.6*  NEUTROABS 22.9*  --   --   --  20.9*  --   --   HGB 7.0*   < > 7.5* 6.8* 8.7* 8.3* 7.7*  HCT 23.1*   < > 22.0* 20.0* 26.9* 25.2* 24.0*  MCV 87.2  --   --   --  85.4 83.7 84.2  PLT 480*  --   --   --  388 454* 331   < > = values in this interval not displayed.   Basic Metabolic Panel: Recent Labs  Lab 03/22/20 1210 03/22/20 1210 03/22/20 1238 03/22/20 1610 03/23/20 1129 03/24/20 0626 03/25/20 0357  NA 127*   < > 128* 129* 130* 130* 128*  K 3.9   < > 3.9 4.1 4.0 4.6 4.3  CL 90*  --  89*  --  94* 95* 96*  CO2 26  --   --   --  25 24 21*  GLUCOSE 114*  --  114*  --  107* 97 121*  BUN 73*  --  75*  --  76* 83* 87*  CREATININE 3.52*  --  4.00*   --  3.75* 3.80* 4.03*  CALCIUM 8.1*  --   --   --  7.9* 7.7* 7.6*   < > = values in this interval not displayed.   GFR: Estimated Creatinine Clearance: 15.2 mL/min (A) (by C-G formula based on SCr of 4.03 mg/dL (H)). Liver Function Tests: Recent Labs  Lab 03/22/20 1210 03/24/20 0626 03/25/20 0357  AST 71* 53* 111*  ALT 69* 49* 77*  ALKPHOS 112 86 112  BILITOT 0.5 0.6 0.6  PROT 6.5 5.5* 5.3*  ALBUMIN 1.6* 1.3* 1.2*   No results for input(s): LIPASE, AMYLASE in the last 168 hours. Recent Labs  Lab 03/22/20 1558  AMMONIA 12   Coagulation Profile: Recent Labs  Lab 03/22/20 1210  INR 1.2   Cardiac Enzymes: No results for input(s): CKTOTAL, CKMB, CKMBINDEX, TROPONINI in the last 168 hours. BNP (last 3 results) No results for input(s): PROBNP in the last 8760 hours. HbA1C: No results for input(s): HGBA1C in the last 72 hours. CBG: Recent Labs  Lab 03/22/20 1446  GLUCAP 106*   Lipid Profile: No results for input(s): CHOL, HDL, LDLCALC, TRIG, CHOLHDL, LDLDIRECT in the last 72 hours. Thyroid Function Tests: No results for input(s): TSH, T4TOTAL, FREET4, T3FREE, THYROIDAB in the last 72 hours. Anemia Panel: No results for input(s): VITAMINB12, FOLATE, FERRITIN, TIBC, IRON, RETICCTPCT in the last 72 hours. Sepsis Labs: Recent Labs  Lab 03/22/20 1210 03/22/20 1558 03/22/20 2050  PROCALCITON  --   --  0.56  LATICACIDVEN 2.1* 1.7  --     Recent Results (from the past 240 hour(s))  Culture, blood (Routine x 2)     Status: None (Preliminary result)   Collection Time: 03/22/20 12:11 PM   Specimen: BLOOD RIGHT HAND  Result Value Ref Range Status   Specimen Description BLOOD RIGHT HAND  Final   Special Requests   Final    BOTTLES DRAWN AEROBIC AND ANAEROBIC Blood Culture adequate volume   Culture   Final    NO GROWTH 2 DAYS Performed at [facility] Lab, [address], [place], [place] [address]  Report Status PENDING  Incomplete  SARS Coronavirus 2 by RT PCR  (hospital order, performed in [facility] hospital lab) Nasopharyngeal Nasopharyngeal Swab     Status: None   Collection Time: 03/22/20  5:22 PM   Specimen: Nasopharyngeal Swab  Result Value Ref Range Status   SARS Coronavirus 2 NEGATIVE NEGATIVE Final    Comment: (NOTE) SARS-CoV-2 target nucleic acids are NOT DETECTED.  The SARS-CoV-2 RNA is generally detectable in upper and lower respiratory specimens during the acute phase of infection. The lowest concentration of SARS-CoV-2 viral copies this assay can detect is 250 copies / mL. A negative result does not preclude SARS-CoV-2 infection and should not be used as the sole basis for treatment or other patient management decisions.  A negative result may occur with improper specimen collection / handling, submission of specimen other than nasopharyngeal swab, presence of viral mutation(s) within the areas targeted by this assay, and inadequate number of viral copies (<250 copies / mL). A negative result must be combined with clinical observations, patient history, and epidemiological information.  Fact Sheet for Patients:   BoilerBrush.com.cy  Fact Sheet for Healthcare Providers: https://pope.com/  This test is not yet approved or  cleared by the [place] FDA and has been authorized for detection and/or diagnosis of SARS-CoV-2 by FDA under an Emergency Use Authorization (EUA).  This EUA will remain in effect (meaning this test can be used) for the duration of the COVID-19 declaration under Section 564(b)(1) of the Act, 21 U.S.C. section 360bbb-3(b)(1), unless the authorization is terminated or revoked sooner.  Performed at [facility], [address], [place], [place] [address]   Culture, blood (Routine x 2)     Status: None (Preliminary result)   Collection Time: 03/22/20  8:50 PM   Specimen: BLOOD LEFT ARM  Result Value Ref Range Status   Specimen Description BLOOD  LEFT ARM  Final   Special Requests   Final    BOTTLES DRAWN AEROBIC AND ANAEROBIC Blood Culture adequate volume   Culture   Final    NO GROWTH 2 DAYS Performed at [facility], [address] [place], [place] [address]    Report Status PENDING  Incomplete  MRSA PCR Screening     Status: None   Collection Time: 03/22/20 11:10 PM   Specimen: Nasal Mucosa; Nasopharyngeal  Result Value Ref Range Status   MRSA by PCR NEGATIVE NEGATIVE Final    Comment:        The GeneXpert MRSA Assay (FDA approved for NASAL specimens only), is one component of a comprehensive MRSA colonization surveillance program. It is not intended to diagnose MRSA infection nor to guide or monitor treatment for MRSA infections. Performed at [facility], [address] [place], [place] [address]          Radiology Studies: No results found. Scheduled Meds: . sodium chloride   Intravenous Once  . aspirin EC  81 mg Oral Daily  . atorvastatin  20 mg Oral Daily  . azithromycin  500 mg Oral Daily  . feeding supplement (ENSURE ENLIVE)  237 mL Oral TID BM  . ferrous sulfate  325 mg Oral BID WC  . folic acid  1 mg Oral Daily  . linaclotide  72 mcg Oral Q0600  . metoprolol tartrate  12.5 mg Oral BID WC   Continuous Infusions: . sodium chloride 75 mL/hr at 03/24/20 0742  . cefTRIAXone (ROCEPHIN)  IV 2 g (03/24/20 1726)  LOS: 3 days   Time spent: 40min  Azucena FallenWilliam C Fruma Africa, DO [facility]  If 7PM-7AM, please contact night-coverage [email or web address]  03/25/2020, 7:19 AM

## 2020-03-26 LAB — COMPREHENSIVE METABOLIC PANEL
ALT: 83 U/L — ABNORMAL HIGH (ref 0–44)
AST: 102 U/L — ABNORMAL HIGH (ref 15–41)
Albumin: 1.7 g/dL — ABNORMAL LOW (ref 3.5–5.0)
Alkaline Phosphatase: 111 U/L (ref 38–126)
Anion gap: 11 (ref 5–15)
BUN: 98 mg/dL — ABNORMAL HIGH (ref 8–23)
CO2: 19 mmol/L — ABNORMAL LOW (ref 22–32)
Calcium: 7.8 mg/dL — ABNORMAL LOW (ref 8.9–10.3)
Chloride: 97 mmol/L — ABNORMAL LOW (ref 98–111)
Creatinine, Ser: 4.22 mg/dL — ABNORMAL HIGH (ref 0.61–1.24)
GFR calc Af Amer: 14 mL/min — ABNORMAL LOW (ref >60)
GFR calc non Af Amer: 12 mL/min — ABNORMAL LOW (ref >60)
Glucose, Bld: 97 mg/dL (ref 70–99)
Potassium: 4.1 mmol/L (ref 3.5–5.1)
Sodium: 127 mmol/L — ABNORMAL LOW (ref 135–145)
Total Bilirubin: 0.5 mg/dL (ref 0.3–1.2)
Total Protein: 5.5 g/dL — ABNORMAL LOW (ref 6.5–8.1)

## 2020-03-26 LAB — CBC
HCT: 23.3 % — ABNORMAL LOW (ref 39.0–52.0)
Hemoglobin: 7.3 g/dL — ABNORMAL LOW (ref 13.0–17.0)
MCH: 27.4 pg (ref 26.0–34.0)
MCHC: 31.3 g/dL (ref 30.0–36.0)
MCV: 87.6 fL (ref 80.0–100.0)
Platelets: 338 10*3/uL (ref 150–400)
RBC: 2.66 MIL/uL — ABNORMAL LOW (ref 4.22–5.81)
RDW: 15.9 % — ABNORMAL HIGH (ref 11.5–15.5)
WBC: 22.1 10*3/uL — ABNORMAL HIGH (ref 4.0–10.5)
nRBC: 0 % (ref 0.0–0.2)

## 2020-03-26 MED ORDER — METOPROLOL TARTRATE 5 MG/5ML IV SOLN
5.0000 mg | INTRAVENOUS | Status: AC | PRN
  Administered 2020-03-27: 5 mg via INTRAVENOUS
  Filled 2020-03-26: qty 5

## 2020-03-26 MED ORDER — ALBUMIN HUMAN 25 % IV SOLN
25.0000 g | Freq: Four times a day (QID) | INTRAVENOUS | Status: AC
  Administered 2020-03-26 (×3): 25 g via INTRAVENOUS
  Filled 2020-03-26 (×3): qty 100

## 2020-03-26 MED ORDER — FUROSEMIDE 10 MG/ML IJ SOLN
20.0000 mg | Freq: Once | INTRAMUSCULAR | Status: AC
  Administered 2020-03-26: 20 mg via INTRAVENOUS
  Filled 2020-03-26: qty 2

## 2020-03-26 MED ORDER — LORAZEPAM 2 MG/ML IJ SOLN
1.0000 mg | Freq: Once | INTRAMUSCULAR | Status: AC
  Administered 2020-03-26: 1 mg via INTRAVENOUS
  Filled 2020-03-26: qty 1

## 2020-03-26 NOTE — Progress Notes (Signed)
Physical Therapy Treatment Patient Details Name: Jeffrey Ho MRN: [id number] DOB: [birth year] Today's Date: 03/26/2020    History of Present Illness Jeffrey Ho is a 85 y.o. male with medical history significant of HTN; HLD; carotid artery occlusion; and R eye blindness presenting with abnormal labs.  Patient with anemia, PNA, FTT and AKI.     PT Comments    Pt with decreased mobility since eval.  He has had frequent diarrhea and limited mobility over the past couple days, leading to decreased mobility today.  Required mod A and cues for sit to stand.  Only able to ambulate a few feet and with poor sitting safety (attempting to sit too early) due to fatigue.  Did update POC to SNF at this time.     Follow Up Recommendations  SNF     Equipment Recommendations  Rolling walker with 5" wheels    Recommendations for Other Services       Precautions / Restrictions Precautions Precautions: Fall    Mobility  Bed Mobility Overal bed mobility: Needs Assistance Bed Mobility: Sit to Supine     Supine to sit: Min assist Sit to supine: Min assist   General bed mobility comments: min A for legs back to bed  Transfers Overall transfer level: Needs assistance Equipment used: Rolling walker (2 wheeled) Transfers: Sit to/from [facility] Sit to Stand: Mod assist Stand pivot transfers: Mod assist       General transfer comment: Sit to stand x 3 from recliner; cues for hand placement and use of momentum; pt with good awareness for foot placement and getting to edge of chair.  With pivot and walking, pt attempting to sit too early requiring mod A for balance and max cues to turn completely prior to sitting  Ambulation/Gait Ambulation/Gait assistance: Mod assist Gait Distance (Feet): 6 Feet Assistive device: Rolling walker (2 wheeled) Gait Pattern/deviations: Step-through pattern;Shuffle;Trunk flexed Gait velocity: decreased   General Gait Details: fatigued  easily, close chair follow (pt needing to sit abruptly but able to give warning); cues for improved foot clearance, RW use, and posture   Stairs             Wheelchair Mobility    Modified Rankin (Stroke Patients Only)       Balance Overall balance assessment: Needs assistance Sitting-balance support: No upper extremity supported;Feet supported Sitting balance-Leahy Scale: Fair     Standing balance support: Bilateral upper extremity supported;During functional activity Standing balance-Leahy Scale: Poor Standing balance comment: reliant on RW for balance                            Cognition Arousal/Alertness: Awake/alert Behavior During Therapy: WFL for tasks assessed/performed Overall Cognitive Status: Impaired/Different from baseline Area of Impairment: Orientation;Memory;Awareness;Safety/judgement                 Orientation Level: Disoriented to;Time   Memory: Decreased short-term memory   Safety/Judgement: Decreased awareness of deficits Awareness: Emergent   General Comments: Had discussion with pt regarding need to spend increased time OOB in chair. Returned to room after 5 min and pt asking when he could get back to bed; Most likely mpaired compared to baseline prior to hospitalizations      Exercises General Exercises - Lower Extremity Ankle Circles/Pumps: AROM;20 reps;Seated;Supine;Both Quad Sets: AROM;Both;10 reps;Supine Long Arc Quad: AROM;Both;10 reps;Seated Hip Flexion/Marching: AROM;Both;10 reps;Seated Other Exercises Other Exercises: chair marches x 20    General Comments General  comments (skin integrity, edema, etc.): Wife present during therapy.  Educated on bed exercises to help with strength and edema.  Also, educated on recommendation for SNF due to decreased mobility and safety.  They would prefer HH if able.  Educated that would continue to work with therapy and assess, but encouraged to consider SNF if needed at time of  d/c      Pertinent Vitals/Pain Pain Assessment: No/denies pain    Home Living                      Prior Function            PT Goals (current goals can now be found in the care plan section) Acute Rehab PT Goals Patient Stated Goal: to go home PT Goal Formulation: With patient/family Time For Goal Achievement: 04/06/20 Potential to Achieve Goals: Good Progress towards PT goals: Not progressing toward goals - comment (pt with limited mobilityover the past couple days and with frequent diarrhea)    Frequency    Min 3X/week      PT Plan Discharge plan needs to be updated    Co-evaluation              AM-PAC PT "6 Clicks" Mobility   Outcome Measure  Help needed turning from your back to your side while in a flat bed without using bedrails?: A Little Help needed moving from lying on your back to sitting on the side of a flat bed without using bedrails?: A Little Help needed moving to and from a bed to a chair (including a wheelchair)?: A Lot Help needed standing up from a chair using your arms (e.g., wheelchair or bedside chair)?: A Lot Help needed to walk in hospital room?: A Lot Help needed climbing 3-5 steps with a railing? : Total 6 Click Score: 13    End of Session Equipment Utilized During Treatment: Gait belt Activity Tolerance: Patient tolerated treatment well Patient left: with call bell/phone within reach;with family/visitor present;with bed alarm set;in bed Nurse Communication: Mobility status PT Visit Diagnosis: Unsteadiness on feet (R26.81);Other abnormalities of gait and mobility (R26.89);Muscle weakness (generalized) (M62.81)     Time: 1335-1400 PT Time Calculation (min) (ACUTE ONLY): 25 min  Charges:  $Gait Training: 8-22 mins $Therapeutic Exercise: 8-22 mins                     Anise Salvo, PT Acute Rehab Services Pager [phone] [facility] [phone]     Jeffrey Ho 03/26/2020, 2:12 PM

## 2020-03-26 NOTE — Progress Notes (Signed)
Occupational Therapy Treatment Patient Details Name: Jeffrey Ho MRN: [id number] DOB: Feb 23, 1935 Today's Date: 03/26/2020    History of present illness Jeffrey Ho is a 85 y.o. male with medical history significant of HTN; HLD; carotid artery occlusion; and R eye blindness presenting with abnormal labs.  Patient with anemia, PNA, FTT and AKI.    OT comments  Pt required increased assistance/ Mod A today with transfers and was only able to take a few pivotal steps to recliner due to fatigue. Stood second time from chair and only able to stand 15 seconds before needing to sit. 3/4 DPE; VSS on RA. Discussed concerns for DC home with pt/wife given increased need for assistance with mobility and ADL. At this time, feel pt would benefit from rehab at SNF to maximize functional level of independence and facilitate safe DC home. Will continue to follow acutely. Recommend increased time OOB with staff.   Follow Up Recommendations  SNF;Supervision/Assistance - 24 hour    Equipment Recommendations  None recommended by OT    Recommendations for Other Services      Precautions / Restrictions Precautions Precautions: Fall       Mobility Bed Mobility Overal bed mobility: Needs Assistance       Supine to sit: Min assist        Transfers Overall transfer level: Needs assistance Equipment used: Rolling walker (2 wheeled) Transfers: Sit to/from [employer] Sit to Stand: Min assist;From elevated surface (Uses mometum to stand from recliner) Stand pivot transfers: Mod assist (able to take pivotal steps )       General transfer comment: Taking pivotal steps to chair - poor foot clearance adn requires multimodal cues ot take larger steps to progress mobility. Pt attempting to sit before fully in chair due to fatigue    Balance Overall balance assessment: Needs assistance   Sitting balance-Leahy Scale: Fair       Standing balance-Leahy Scale: Poor                              ADL either performed or assessed with clinical judgement   ADL Overall ADL's : Needs assistance/impaired Eating/Feeding: Set up;Sitting   Grooming: Set up;Sitting   Upper Body Bathing: Set up;Sitting   Lower Body Bathing: Moderate assistance;Sit to/from stand   Upper Body Dressing : Minimal assistance;Sitting   Lower Body Dressing: Maximal assistance;Sit to/from stand       Toileting- Architect and Hygiene: Total assistance Toileting - Clothing Manipulation Details (indicate cue type and reason): external catheter adn rectal foley     Functional mobility during ADLs: Moderate assistance;Rolling walker;Cueing for safety General ADL Comments: VC for handplacement; Pt verbalized need to scoot forward to the "edge of the chair" prior to stand; does better when using momentum     Vision   Additional Comments: Blind R eye   Perception     Praxis      Cognition Arousal/Alertness: Awake/alert Behavior During Therapy: Flat affect Overall Cognitive Status: Impaired/Different from baseline Area of Impairment: Orientation;Memory;Awareness;Safety/judgement                 Orientation Level: Disoriented to;Time   Memory: Decreased short-term memory   Safety/Judgement: Decreased awareness of deficits Awareness: Emergent   General Comments: Had discussion with pt regarding need to spend increased time OOB in chair. Returned to room after 5 min and pt asking when he could get back to bed; Most likely  mpaired compared to baseline prior to hospitalizations        Exercises Exercises: Other exercises Other Exercises Other Exercises: chair marches x 20   Shoulder Instructions       General Comments Wife present for session adn agreed that pt is much weaker than initial eval on Saturday. Encouraged time OOB in chair and seated exercises. Pt/wfie verbalized understanding.    Pertinent Vitals/ Pain       Pain Assessment: No/denies  pain  Home Living                                          Prior Functioning/Environment              Frequency  Min 2X/week        Progress Toward Goals  OT Goals(current goals can now be found in the care plan section)  Progress towards OT goals: Not progressing toward goals - comment (decline in strength)  Acute Rehab OT Goals Patient Stated Goal: to go home OT Goal Formulation: With patient/family Time For Goal Achievement: 04/07/20 Potential to Achieve Goals: Good ADL Goals Pt Will Perform Grooming: with min guard assist;standing Pt Will Perform Lower Body Bathing: with min assist;sitting/lateral leans;sit to/from stand;with adaptive equipment Pt Will Perform Lower Body Dressing: with min assist;sitting/lateral leans;sit to/from stand;with adaptive equipment Pt Will Transfer to Toilet: with min guard assist;ambulating;regular height toilet;grab bars Additional ADL Goal #1: Pt will recall and apply 3-5 fall prevention strategies to apply to home environment prior to d/c  Plan Discharge plan needs to be updated    Co-evaluation                 AM-PAC OT "6 Clicks" Daily Activity     Outcome Measure   Help from another person eating meals?: None Help from another person taking care of personal grooming?: A Little Help from another person toileting, which includes using toliet, bedpan, or urinal?: Total Help from another person bathing (including washing, rinsing, drying)?: A Lot Help from another person to put on and taking off regular upper body clothing?: A Little Help from another person to put on and taking off regular lower body clothing?: A Lot 6 Click Score: 15    End of Session Equipment Utilized During Treatment: Gait belt;Rolling walker  OT Visit Diagnosis: Unsteadiness on feet (R26.81);Other abnormalities of gait and mobility (R26.89);Muscle weakness (generalized) (M62.81)   Activity Tolerance Patient tolerated treatment  well   Patient Left in chair;with call bell/phone within reach;with family/visitor present   Nurse Communication Mobility status        Time: 1105-1140 OT Time Calculation (min): 35 min  Charges: OT General Charges $OT Visit: 1 Visit OT Treatments $Self Care/Home Management : 23-37 mins  Maurie Boettcher, OT/L   Acute OT Clinical Specialist [place] Pager [phone] Office [phone]    [facility] 03/26/2020, 12:14 PM

## 2020-03-26 NOTE — Progress Notes (Signed)
Patient ID: Jeffrey Ho, male   DOB: May 24, 1935, 85 y.o.   MRN: [id number]  Patient was initially seen by PMT on 03-23-20 for GOCs.  This NP visited patient at the bedside as a follow up for palliative medicine needs and emotional support.   Patient remains weak and intermittently confused and agitated.  His wife is at bedside.  I introduced myself to Jeffrey Ho as a provider with the palliative medicine team.  Created space and opportunity for wife to explore thoughts and feelings regarding patient's current medical situation.  It is difficult for her to talk about her husband situation she is tearful at times as she verbalizes  'how hard it is to see him like this".    She reports continued physical and functional decline over the past  months.  This is the fifth hospitalization in the past  months.  Initially he was hospitalized on 12/31/2019 with a community-acquired pneumonia, he has had ongoing failure to thrive   Patient's wife verbalizes feeling overwhelmed.   Her son in law- Dr Harlene Salts # [phone] is a strong support for her and she values his judgment.  She would appreciate if he were called and updated on the patient's current medical situation in order to help her with decision making.  Daughter Jeffrey Ho # [phone]  Spoke to Dr Virginia Rochester, he will speak to the son-in-law and update him today.   Later this afternoon I returned to the room and again spoke to the patient's wife and his daughter Jeffrey Ho is present currently. Continue conversation regarding current medical situation, prognosis, limitations of medical interventions to prolong quality of life when the body begins to fail to thrive, and the patient's overall poor prognosis.  Plan of care: -DNR/DNI -Focus of care shifted to focus on comfort, quality and dignity -No further diagnostics, lab sticks -Minimize medications to focus on symptom management -Discussed with Assistant nursing director and  approval for wife to stay at the bedside 24/7. - PMT to Reevaluate in the morning for appropriateness of transition to residential hospice.  Education offered to family regarding natural trajectory and expectations at end of life.  I prepared family at bedside that anything could happen at any time and that prognosis is likely hours to days.  Questions and concerns addressed   updated Dr Chancy Milroy via secure chat.  Discussed with bedside RN  Total time spent on the unit was 80  minutes  Greater than 50% of the time was spent in counseling and coordination of care  Lorinda Creed NP  Palliative Medicine Team Team Phone # [phone] Pager [phone]

## 2020-03-26 NOTE — Progress Notes (Signed)
PROGRESS NOTE    [name]  ZDG:[id number] DOB: [birth year] DOA: 03/22/2020 PCP: [name], MD   Brief Narrative:  Jeffrey Ho is a 85 y.o. male with medical history significant of HTN; HLD; carotid artery occlusion; and R eye blindness presenting with abnormal labs.  Since last hospitalization, he has had home therapy and HHRN.  He has good days and bad days.  Yesterday was a pretty good day with therapy.  His legs have been swollen and they went to their doctor Wednesday; they were told to increase fluid pills from as needed to 2 daily.  Today, he has had no energy and is not eating.  He is having to force him to eat.  He will drink a smoothie but otherwise has no appetite and little PO intake.  On Wednesday, he was given Remeron (?) and he has taken it for 2 nights with no change.  His doctor on Wednesday ordered blood work and they called this AM and said he should come in to the ER.  He is SOB with exertion but this is unchanged.  He has occasional coughing spells.  His weight was 234 and went down to 190 but recently 203. He was last admitted from 5/25-28 for proctitis with stercoral colitis.  He was disimpacted in the ER and admitted with improvement.  He was DNR.  He was recommended for SNF and declined.  He has had 4 ER visits and 1 admission in the last 6 months.  He went to rehab about 9 days ago.  Previously, his family reported that "his energy is getting worse. Just moving to the bathroom is exhausting. He walks short distances and is wiped out. He stays nauseated. He is SOB now just from using the bedside commode. He eats small amounts of food. He felt better after hospital d/c and worse since".   Assessment & Plan:   Principal Problem:   Failure to thrive in adult Active Problems:   HTN (hypertension)   CAP (community acquired pneumonia)   Hyperlipidemia   Hyponatremia   Hypoalbuminemia due to protein-calorie malnutrition (HCC)   Leukocytosis   AKI (acute  kidney injury) (HCC)   PMR (polymyalgia rheumatica) (HCC)   AKI, likely pre-renal in the setting of failure to thrive and poor PO intake, POA Volume overload, concurrent - Very low albumin with third spacing consistent with limited PO intake/malnutrition - albumin ongoing PRN to improve output/volume status - Hold IV fluids today -patient having worsening volume status in the setting of IV fluid challenge - Administer furosemide 20 IV x1 to improve urine output with albumin - Patient currently not candidate for dialysis per discussion with family -we discussed that should patient continue to worsen palliative care would likely be something to consider in the near future Lab Results  Component Value Date   CREATININE 4.22 (H) 03/26/2020   CREATININE 4.03 (H) 03/25/2020   CREATININE 3.80 (H) 03/24/2020   Acute symptomatic anemia, without overt source Cannot rule out chronic anemia of chronic disease given poor p.o. intake/failure to thrive - Previous hospitalization for symptomatic anemia with heme positive stool (EGD with mild erosive esophagitis colonoscopy with polyps and diverticula but no overt bleeding), family declines any recent black or bloody stool-we will continue to follow in the inpatient setting -Hemoccult pending - Patient's anemia ongoing, at this point given poor p.o. intake and failure to thrive as below unclear if this is malnutrition or iron deficiency on top of likely chronic anemia of chronic  disease - Status post 2 unit transfusion, repeat H&H WNL but mildly downtrending -Multiple movements at this time continue to be brown, fecal occult testing pending CBC Latest Ref Rng & Units 03/26/2020 03/25/2020 03/24/2020  WBC 4.0 - 10.5 K/uL 22.1(H) 24.6(H) 24.8(H)  Hemoglobin 13.0 - 17.0 g/dL 7.3(L) 7.7(L) 8.3(L)  Hematocrit 39 - 52 % 23.3(L) 24.0(L) 25.2(L)  Platelets 150 - 400 K/uL 338 331 454(H)   Community acquired pneumonia, POA - Leukocytosis and moderately elevated  procalcitonin may be associated with infection - CXR unremarkable but patient was markedly dehydrated at intake making this less reliable - For now, will continue to cover with Rocephin/Azithromycin; MRSA negative DC Vanc  Acute metabolic encephalopathy, POA resolved - Likely multifactorial in etiology given concern for pneumonia, symptomatic anemia as well as elevated creatinine and uremia - Mental status back to baseline per wife, still somewhat somnolent in the morning but A/O x4 - Advance diet as tolerated  Failure to thrive - Patient with multiple hospitalizations since March with worsening PO intake - Progressive decline ongoing - discussed palliative care as above if no improvement after treatment of pneumonia - Continue Remeron for now, son has concerns about ongoing use  Deconditioning/ambulatory dysfunction - PT/OT/ST consults - Albumin 1.6, poor PO intake, nutrition consult - attempt albumin today to improve UOP/3rd spacing - Wife prefers to keep patient home if possible  Hyponatremia, hypovolemic - Continue IV fluids  Carotid occlusion, history of - s/p B CEA - No evidence of restenosis on dopplers in 04/2018 - 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 - Continue ASA  HTN, essential - Continue Lopressor  HLD - Continue Lipitor - Hold fish oil  Recent stercoral ulcer with colitis - Continue Linzess  DVT prophylaxis:SCDs Code Status:DNR- confirmed with patient/family Family Communication:Wife at bedside Status is: Inpatient  Dispo: The patient is from: Home              Anticipated d/c is to: TBD              Anticipated d/c date is: 72-96h pending clinical course              Patient currently NOT medically stable for discharge due to ongoing need for IV fluids, antibiotics and close monitoring  Consultants:   None   Procedures:   None planned  Antimicrobials:  Ceftriaxone/azithromycin Vancomycin discontinued   Subjective: No  acute issues or events overnight, denies nausea, vomiting, diarrhea, constipation, headache, fevers, chills.  Objective: Vitals:   03/25/20 0753 03/25/20 1532 03/25/20 2100 03/26/20 0522  BP: 140/65 (!) 146/60 138/65 125/80  Pulse: 93 91  (!) 107  Resp:   20 20  Temp: 99 F (37.2 C) 98.4 F (36.9 C) 97.8 F (36.6 C) 98.6 F (37 C)  TempSrc:   Oral Oral  SpO2: 95% 100% 97% 97%  Weight:      Height:        Intake/Output Summary (Last 24 hours) at 03/26/2020 0746 Last data filed at 03/26/2020 0600 Gross per 24 hour  Intake 4142.91 ml  Output 1350 ml  Net 2792.91 ml   Filed Weights   03/22/20 1152  Weight: 92.1 kg    Examination:  General:  Pleasantly resting in bed, No acute distress.  A/O x4 HEENT:  Normocephalic atraumatic.  Sclerae nonicteric, noninjected.  Extraocular movements intact bilaterally. Neck:  Without mass or deformity.  Trachea is midline. Lungs:  Clear to auscultate bilaterally without rhonchi, wheeze, or rales. Heart:  Regular rate and  rhythm.  Without murmurs, rubs, or gallops. Abdomen:  Soft, nontender, nondistended.  Without guarding or rebound. Extremities: Without cyanosis, clubbing, diffuse 2+ pitting edema in all 4 extremities Vascular:  Dorsalis pedis and posterior tibial pulses palpable bilaterally. Skin:  Warm and dry, no erythema, no ulcerations.   Data Reviewed: I have personally reviewed following labs and imaging studies  CBC: Recent Labs  Lab 03/22/20 1210 03/22/20 1238 03/22/20 1610 03/23/20 1129 03/24/20 0626 03/25/20 0357 03/26/20 0338  WBC 25.4*  --   --  23.9* 24.8* 24.6* 22.1*  NEUTROABS 22.9*  --   --  20.9*  --   --   --   HGB 7.0*   < > 6.8* 8.7* 8.3* 7.7* 7.3*  HCT 23.1*   < > 20.0* 26.9* 25.2* 24.0* 23.3*  MCV 87.2  --   --  85.4 83.7 84.2 87.6  PLT 480*  --   --  388 454* 331 338   < > = values in this interval not displayed.   Basic Metabolic Panel: Recent Labs  Lab 03/22/20 1210 03/22/20 1210 03/22/20 1238  03/22/20 1238 03/22/20 1610 03/23/20 1129 03/24/20 0626 03/25/20 0357 03/26/20 0338  NA 127*   < > 128*   < > 129* 130* 130* 128* 127*  K 3.9   < > 3.9   < > 4.1 4.0 4.6 4.3 4.1  CL 90*   < > 89*  --   --  94* 95* 96* 97*  CO2 26  --   --   --   --  25 24 21* 19*  GLUCOSE 114*   < > 114*  --   --  107* 97 121* 97  BUN 73*   < > 75*  --   --  76* 83* 87* 98*  CREATININE 3.52*   < > 4.00*  --   --  3.75* 3.80* 4.03* 4.22*  CALCIUM 8.1*  --   --   --   --  7.9* 7.7* 7.6* 7.8*   < > = values in this interval not displayed.   GFR: Estimated Creatinine Clearance: 14.6 mL/min (A) (by C-G formula based on SCr of 4.22 mg/dL (H)). Liver Function Tests: Recent Labs  Lab 03/22/20 1210 03/24/20 0626 03/25/20 0357 03/26/20 0338  AST 71* 53* 111* 102*  ALT 69* 49* 77* 83*  ALKPHOS 112 86 112 111  BILITOT 0.5 0.6 0.6 0.5  PROT 6.5 5.5* 5.3* 5.5*  ALBUMIN 1.6* 1.3* 1.2* 1.7*   No results for input(s): LIPASE, AMYLASE in the last 168 hours. Recent Labs  Lab 03/22/20 1558  AMMONIA 12   Coagulation Profile: Recent Labs  Lab 03/22/20 1210  INR 1.2   Cardiac Enzymes: No results for input(s): CKTOTAL, CKMB, CKMBINDEX, TROPONINI in the last 168 hours. BNP (last 3 results) No results for input(s): PROBNP in the last 8760 hours. HbA1C: No results for input(s): HGBA1C in the last 72 hours. CBG: Recent Labs  Lab 03/22/20 1446  GLUCAP 106*   Lipid Profile: No results for input(s): CHOL, HDL, LDLCALC, TRIG, CHOLHDL, LDLDIRECT in the last 72 hours. Thyroid Function Tests: No results for input(s): TSH, T4TOTAL, FREET4, T3FREE, THYROIDAB in the last 72 hours. Anemia Panel: No results for input(s): VITAMINB12, FOLATE, FERRITIN, TIBC, IRON, RETICCTPCT in the last 72 hours. Sepsis Labs: Recent Labs  Lab 03/22/20 1210 03/22/20 1558 03/22/20 2050  PROCALCITON  --   --  0.56  LATICACIDVEN 2.1* 1.7  --     Recent Results (  from the past 240 hour(s))  Culture, blood (Routine x 2)      Status: None (Preliminary result)   Collection Time: 03/22/20 12:11 PM   Specimen: BLOOD RIGHT HAND  Result Value Ref Range Status   Specimen Description BLOOD RIGHT HAND  Final   Special Requests   Final    BOTTLES DRAWN AEROBIC AND ANAEROBIC Blood Culture adequate volume   Culture   Final    NO GROWTH 3 DAYS Performed at [facility], [address], [place], [place] [address]    Report Status PENDING  Incomplete  SARS Coronavirus 2 by RT PCR (hospital order, performed in [facility] hospital lab) Nasopharyngeal Nasopharyngeal Swab     Status: None   Collection Time: 03/22/20  5:22 PM   Specimen: Nasopharyngeal Swab  Result Value Ref Range Status   SARS Coronavirus 2 NEGATIVE NEGATIVE Final    Comment: (NOTE) SARS-CoV-2 target nucleic acids are NOT DETECTED.  The SARS-CoV-2 RNA is generally detectable in upper and lower respiratory specimens during the acute phase of infection. The lowest concentration of SARS-CoV-2 viral copies this assay can detect is 250 copies / mL. A negative result does not preclude SARS-CoV-2 infection and should not be used as the sole basis for treatment or other patient management decisions.  A negative result may occur with improper specimen collection / handling, submission of specimen other than nasopharyngeal swab, presence of viral mutation(s) within the areas targeted by this assay, and inadequate number of viral copies (<250 copies / mL). A negative result must be combined with clinical observations, patient history, and epidemiological information.  Fact Sheet for Patients:   BoilerBrush.com.cy  Fact Sheet for Healthcare Providers: https://pope.com/  This test is not yet approved or  cleared by the [place] FDA and has been authorized for detection and/or diagnosis of SARS-CoV-2 by FDA under an Emergency Use Authorization (EUA).  This EUA will remain in effect (meaning this test can  be used) for the duration of the COVID-19 declaration under Section 564(b)(1) of the Act, 21 U.S.C. section 360bbb-3(b)(1), unless the authorization is terminated or revoked sooner.  Performed at [facility], [address], [place], [place] [address]   Culture, blood (Routine x 2)     Status: None (Preliminary result)   Collection Time: 03/22/20  8:50 PM   Specimen: BLOOD LEFT ARM  Result Value Ref Range Status   Specimen Description BLOOD LEFT ARM  Final   Special Requests   Final    BOTTLES DRAWN AEROBIC AND ANAEROBIC Blood Culture adequate volume   Culture   Final    NO GROWTH 3 DAYS Performed at [facility], [address], [place], [place] [address]    Report Status PENDING  Incomplete  MRSA PCR Screening     Status: None   Collection Time: 03/22/20 11:10 PM   Specimen: Nasal Mucosa; Nasopharyngeal  Result Value Ref Range Status   MRSA by PCR NEGATIVE NEGATIVE Final    Comment:        The GeneXpert MRSA Assay (FDA approved for NASAL specimens only), is one component of a comprehensive MRSA colonization surveillance program. It is not intended to diagnose MRSA infection nor to guide or monitor treatment for MRSA infections. Performed at [facility], [address], [place], [place] [address]          Radiology Studies: No results found. Scheduled Meds: . sodium chloride   Intravenous Once  . aspirin EC  81 mg Oral Daily  . atorvastatin  20 mg Oral Daily  . azithromycin  500 mg Oral Daily  . feeding supplement (ENSURE ENLIVE)  237 mL Oral TID BM  . feeding supplement (PRO-STAT SUGAR FREE 64)  30 mL Oral BID  . ferrous sulfate  325 mg Oral BID WC  . folic acid  1 mg Oral Daily  . metoprolol tartrate  12.5 mg Oral BID WC   Continuous Infusions: . sodium chloride 75 mL/hr at 03/26/20 0343  . cefTRIAXone (ROCEPHIN)  IV 2 g (03/25/20 2059)     LOS: 4 days   Time spent:  Azucena Fallen, DO [facility]  If  7PM-7AM, please contact night-coverage [email or web address]  03/26/2020, 7:46 AM

## 2020-03-27 ENCOUNTER — Inpatient Hospital Stay (HOSPITAL_COMMUNITY): Payer: Medicare Other

## 2020-03-27 DIAGNOSIS — R451 Restlessness and agitation: Secondary | ICD-10-CM

## 2020-03-27 DIAGNOSIS — R627 Adult failure to thrive: Secondary | ICD-10-CM

## 2020-03-27 DIAGNOSIS — E46 Unspecified protein-calorie malnutrition: Secondary | ICD-10-CM

## 2020-03-27 DIAGNOSIS — N179 Acute kidney failure, unspecified: Principal | ICD-10-CM

## 2020-03-27 DIAGNOSIS — G9341 Metabolic encephalopathy: Secondary | ICD-10-CM

## 2020-03-27 DIAGNOSIS — R601 Generalized edema: Secondary | ICD-10-CM

## 2020-03-27 DIAGNOSIS — J189 Pneumonia, unspecified organism: Secondary | ICD-10-CM

## 2020-03-27 DIAGNOSIS — R0609 Other forms of dyspnea: Secondary | ICD-10-CM

## 2020-03-27 DIAGNOSIS — R0600 Dyspnea, unspecified: Secondary | ICD-10-CM | POA: Diagnosis present

## 2020-03-27 DIAGNOSIS — E8809 Other disorders of plasma-protein metabolism, not elsewhere classified: Secondary | ICD-10-CM

## 2020-03-27 LAB — CULTURE, BLOOD (ROUTINE X 2)
Culture: NO GROWTH
Culture: NO GROWTH
Special Requests: ADEQUATE
Special Requests: ADEQUATE

## 2020-03-27 LAB — CBC
HCT: 24.8 % — ABNORMAL LOW (ref 39.0–52.0)
Hemoglobin: 7.9 g/dL — ABNORMAL LOW (ref 13.0–17.0)
MCH: 27.1 pg (ref 26.0–34.0)
MCHC: 31.9 g/dL (ref 30.0–36.0)
MCV: 84.9 fL (ref 80.0–100.0)
Platelets: 344 10*3/uL (ref 150–400)
RBC: 2.92 MIL/uL — ABNORMAL LOW (ref 4.22–5.81)
RDW: 15.7 % — ABNORMAL HIGH (ref 11.5–15.5)
WBC: 31.7 10*3/uL — ABNORMAL HIGH (ref 4.0–10.5)
nRBC: 0 % (ref 0.0–0.2)

## 2020-03-27 LAB — COMPREHENSIVE METABOLIC PANEL
ALT: 80 U/L — ABNORMAL HIGH (ref 0–44)
AST: 89 U/L — ABNORMAL HIGH (ref 15–41)
Albumin: 2.2 g/dL — ABNORMAL LOW (ref 3.5–5.0)
Alkaline Phosphatase: 112 U/L (ref 38–126)
Anion gap: 15 (ref 5–15)
BUN: 114 mg/dL — ABNORMAL HIGH (ref 8–23)
CO2: 19 mmol/L — ABNORMAL LOW (ref 22–32)
Calcium: 8.5 mg/dL — ABNORMAL LOW (ref 8.9–10.3)
Chloride: 96 mmol/L — ABNORMAL LOW (ref 98–111)
Creatinine, Ser: 4.64 mg/dL — ABNORMAL HIGH (ref 0.61–1.24)
GFR calc Af Amer: 13 mL/min — ABNORMAL LOW (ref >60)
GFR calc non Af Amer: 11 mL/min — ABNORMAL LOW (ref >60)
Glucose, Bld: 135 mg/dL — ABNORMAL HIGH (ref 70–99)
Potassium: 4.5 mmol/L (ref 3.5–5.1)
Sodium: 130 mmol/L — ABNORMAL LOW (ref 135–145)
Total Bilirubin: 0.5 mg/dL (ref 0.3–1.2)
Total Protein: 6.2 g/dL — ABNORMAL LOW (ref 6.5–8.1)

## 2020-03-27 MED ORDER — ACETAMINOPHEN 325 MG PO TABS
650.0000 mg | ORAL_TABLET | Freq: Four times a day (QID) | ORAL | Status: DC
  Administered 2020-03-27: 650 mg via ORAL
  Filled 2020-03-27 (×2): qty 2

## 2020-03-27 MED ORDER — MORPHINE SULFATE (CONCENTRATE) 10 MG/0.5ML PO SOLN
5.0000 mg | ORAL | Status: DC | PRN
  Administered 2020-03-27 (×3): 5 mg via ORAL
  Filled 2020-03-27 (×3): qty 0.5

## 2020-03-27 MED ORDER — FUROSEMIDE 10 MG/ML IJ SOLN
20.0000 mg | Freq: Once | INTRAMUSCULAR | Status: AC
  Administered 2020-03-27: 20 mg via INTRAVENOUS
  Filled 2020-03-27: qty 2

## 2020-03-27 MED ORDER — LORAZEPAM 2 MG/ML IJ SOLN
1.0000 mg | INTRAMUSCULAR | Status: DC | PRN
  Administered 2020-03-27 – 2020-03-30 (×4): 1 mg via INTRAVENOUS
  Filled 2020-03-27 (×4): qty 1

## 2020-03-27 NOTE — TOC Progression Note (Signed)
Transition of Care [facility]) - Progression Note    Patient Details  Name: ASHDEN SONNENBERG MRN: [id number] Date of Birth: 11-18-1934  Transition of [facility] CM/SW Contact  Beckie Busing, RN Phone Number: [phone]  03/27/2020, 1:49 PM  Clinical Narrative:   CM consulted for SNF placement. List of choices provided to the patient and wife. MD asked CM to wait before full workup is done due to palliative consult to determine goals of care. CM will continue to follow closely for needs.      Expected Discharge Plan: Home w Home Health Services Barriers to Discharge: Continued Medical Work up  Expected Discharge Plan and Services Expected Discharge Plan: Home w Home Health Services   Discharge Planning Services: CM Consult Post Acute Care Choice: Home Health Living arrangements for the past 2 months: Single Family Home                                       Social Determinants of Health (SDOH) Interventions    Readmission Risk Interventions Readmission Risk Prevention Plan 03/27/2020  Transportation Screening Complete  Medication Review [employer]) Referral to Pharmacy  PCP or Specialist appointment within 3-5 days of discharge Complete  HRI or Home Care Consult Complete  SW Recovery Care/Counseling Consult Complete  Palliative Care Screening Complete  Skilled Nursing Facility Not Complete  SNF Comments MD requesting that CM wait until after palliative has seen pt. due to potential hospice appropiate  Some recent data might be hidden

## 2020-03-27 NOTE — Progress Notes (Signed)
PROGRESS NOTE  Jeffrey Ho IFO:[id number] DOB: 04-01-1935 DOA: 03/22/2020 PCP: [name], MD  HPI/Recap of past 50 hours: 85 year old with past medical history of hypertension, hyperlipidemia and carotid artery occlusion although overall medically stable with little issues up until approximately 6 to 9 months ago who has had multiple hospitalizations over the last 6 months for decreased p.o. intake.  Admitted on 6/18 for failure to thrive along with confusion, acute kidney injury, hyponatremia and suspected pneumonia after noted to have leukocytosis.  Patient found to be very malnourished.  Attempts to hydrate have only led to third spacing with no improvement with IV albumin/Lasix.  Renal function has continued to decline.  Patient's mentation has continued to worsen as well.  After discussion with patient's wife today and then follow-up with palliative care, patient's wife has decided to make him comfort care with goal to take him home with hospice, possibly tomorrow.  Assessment/Plan: Principal Problem:   Failure to thrive in adult: As below.  Now comfort care. Active Problems:   HTN (hypertension)   CAP (community acquired pneumonia): X-ray unrevealing on admission.  No evidence of hypoxia.  No other source of infection seen.  Procalcitonin elevated so started on some antibiotics.  White count has continued to worsen despite antibiotics and I suspect that this may be more secondary to stress margination from worsening condition.  We will stop antibiotics now that he is comfort care.  Lactic acidosis seen on admission: Despite leukocytosis, suspect lactic acid is more from severe intravascular volume depletion.   Hyperlipidemia   Hyponatremia: Likely in part due to dehydration.   Hypoalbuminemia due to severe protein-calorie malnutrition [facility]): He with IV albumin, albumin only minimally improved.  Third spacing all fluids.  IV Lasix and albumin has done little to improve this as  well.  Mentation worsening and is unable to take p.o.  Now comfort care.    Leukocytosis: Suspect stress margination from worsening renal function and overall decline.    AKI (acute kidney injury) (HCC) in the setting of recent stage III chronic kidney disease: Patient's renal function back in April was normal.  During last hospitalization in May, baseline around 1.5.  Presented to the emergency room with creatinine around 3.5.  Since that time, has steadily declined.  Not really responded to IV fluids given his third spacing.  Now comfort care.   PMR (polymyalgia rheumatica) (HCC)   Dyspnea   Agitation Acute metabolic encephalopathy: Suspect large part of this is uremia.  Now comfort care.  Code Status: DNR, now comfort care  Family Communication: Had extensive discussion with patient's son-in-law by phone who is a physician and patient's wife at the bedside  Disposition Plan: Now comfort care.  Potentially home with hospice tomorrow   Consultants:  Palliative care  Procedures:  None  Antimicrobials:  Rocephin and Zithromax 6/18-present  DVT prophylaxis: Lovenox   Objective: Vitals:   03/27/20 0416 03/27/20 0738  BP: (!) 161/72 (!) 145/76  Pulse: (!) 108 100  Resp:  16  Temp: 99.2 F (37.3 C) 98.6 F (37 C)  SpO2: 97% 99%   No intake or output data in the 24 hours ending 03/27/20 1715 Filed Weights   03/22/20 1152  Weight: 92.1 kg   Body mass index is 27.53 kg/m.  Exam:   General: Intermittently delirious  Cardiovascular: Regular rhythm, borderline tachycardia  Respiratory: Scattered rails  Abdomen: Soft, nontender distended, hypoactive bowel sounds  Musculoskeletal: 2+ pitting edema bilaterally  Skin: No skin breaks, tears  or lesions  Psychiatry: Acutely delirious   Data Reviewed: CBC: Recent Labs  Lab 03/22/20 1210 03/22/20 1238 03/23/20 1129 03/24/20 0626 03/25/20 0357 03/26/20 0338 03/27/20 0326  WBC 25.4*   < > 23.9* 24.8* 24.6*  22.1* 31.7*  NEUTROABS 22.9*  --  20.9*  --   --   --   --   HGB 7.0*   < > 8.7* 8.3* 7.7* 7.3* 7.9*  HCT 23.1*   < > 26.9* 25.2* 24.0* 23.3* 24.8*  MCV 87.2   < > 85.4 83.7 84.2 87.6 84.9  PLT 480*   < > 388 454* 331 338 344   < > = values in this interval not displayed.   Basic Metabolic Panel: Recent Labs  Lab 03/23/20 1129 03/24/20 0626 03/25/20 0357 03/26/20 0338 03/27/20 0326  NA 130* 130* 128* 127* 130*  K 4.0 4.6 4.3 4.1 4.5  CL 94* 95* 96* 97* 96*  CO2 25 24 21* 19* 19*  GLUCOSE 107* 97 121* 97 135*  BUN 76* 83* 87* 98* 114*  CREATININE 3.75* 3.80* 4.03* 4.22* 4.64*  CALCIUM 7.9* 7.7* 7.6* 7.8* 8.5*   GFR: Estimated Creatinine Clearance: 13.2 mL/min (A) (by C-G formula based on SCr of 4.64 mg/dL (H)). Liver Function Tests: Recent Labs  Lab 03/22/20 1210 03/24/20 0626 03/25/20 0357 03/26/20 0338 03/27/20 0326  AST 71* 53* 111* 102* 89*  ALT 69* 49* 77* 83* 80*  ALKPHOS 112 86 112 111 112  BILITOT 0.5 0.6 0.6 0.5 0.5  PROT 6.5 5.5* 5.3* 5.5* 6.2*  ALBUMIN 1.6* 1.3* 1.2* 1.7* 2.2*   No results for input(s): LIPASE, AMYLASE in the last 168 hours. Recent Labs  Lab 03/22/20 1558  AMMONIA 12   Coagulation Profile: Recent Labs  Lab 03/22/20 1210  INR 1.2   Cardiac Enzymes: No results for input(s): CKTOTAL, CKMB, CKMBINDEX, TROPONINI in the last 168 hours. BNP (last 3 results) No results for input(s): PROBNP in the last 8760 hours. HbA1C: No results for input(s): HGBA1C in the last 72 hours. CBG: Recent Labs  Lab 03/22/20 1446  GLUCAP 106*   Lipid Profile: No results for input(s): CHOL, HDL, LDLCALC, TRIG, CHOLHDL, LDLDIRECT in the last 72 hours. Thyroid Function Tests: No results for input(s): TSH, T4TOTAL, FREET4, T3FREE, THYROIDAB in the last 72 hours. Anemia Panel: No results for input(s): VITAMINB12, FOLATE, FERRITIN, TIBC, IRON, RETICCTPCT in the last 72 hours. Urine analysis:    Component Value Date/Time   COLORURINE YELLOW 03/22/2020  2339   APPEARANCEUR CLEAR 03/22/2020 2339   LABSPEC 1.010 03/22/2020 2339   PHURINE 7.0 03/22/2020 2339   GLUCOSEU NEGATIVE 03/22/2020 2339   HGBUR SMALL (A) 03/22/2020 2339   BILIRUBINUR NEGATIVE 03/22/2020 2339   KETONESUR NEGATIVE 03/22/2020 2339   PROTEINUR NEGATIVE 03/22/2020 2339   UROBILINOGEN 0.2 03/28/2009 0611   NITRITE NEGATIVE 03/22/2020 2339   LEUKOCYTESUR NEGATIVE 03/22/2020 2339   Sepsis Labs: @LABRCNTIP (procalcitonin:4,lacticidven:4)  ) Recent Results (from the past 240 hour(s))  Culture, blood (Routine x 2)     Status: None   Collection Time: 03/22/20 12:11 PM   Specimen: BLOOD RIGHT HAND  Result Value Ref Range Status   Specimen Description BLOOD RIGHT HAND  Final   Special Requests   Final    BOTTLES DRAWN AEROBIC AND ANAEROBIC Blood Culture adequate volume   Culture   Final    NO GROWTH 5 DAYS Performed at [facility], [address], [place], [place]    Report Status 03/27/2020  FINAL  Final  SARS Coronavirus 2 by RT PCR (hospital order, performed in [facility] hospital lab) Nasopharyngeal Nasopharyngeal Swab     Status: None   Collection Time: 03/22/20  5:22 PM   Specimen: Nasopharyngeal Swab  Result Value Ref Range Status   SARS Coronavirus 2 NEGATIVE NEGATIVE Final    Comment: (NOTE) SARS-CoV-2 target nucleic acids are NOT DETECTED.  The SARS-CoV-2 RNA is generally detectable in upper and lower respiratory specimens during the acute phase of infection. The lowest concentration of SARS-CoV-2 viral copies this assay can detect is 250 copies / mL. A negative result does not preclude SARS-CoV-2 infection and should not be used as the sole basis for treatment or other patient management decisions.  A negative result may occur with improper specimen collection / handling, submission of specimen other than nasopharyngeal swab, presence of viral mutation(s) within the areas targeted by this assay, and inadequate number of viral  copies (<250 copies / mL). A negative result must be combined with clinical observations, patient history, and epidemiological information.  Fact Sheet for Patients:   BoilerBrush.com.cy  Fact Sheet for Healthcare Providers: https://pope.com/  This test is not yet approved or  cleared by the [place] FDA and has been authorized for detection and/or diagnosis of SARS-CoV-2 by FDA under an Emergency Use Authorization (EUA).  This EUA will remain in effect (meaning this test can be used) for the duration of the COVID-19 declaration under Section 564(b)(1) of the Act, 21 U.S.C. section 360bbb-3(b)(1), unless the authorization is terminated or revoked sooner.  Performed at [facility], [address] [place], [place] [address]   Culture, blood (Routine x 2)     Status: None   Collection Time: 03/22/20  8:50 PM   Specimen: BLOOD LEFT ARM  Result Value Ref Range Status   Specimen Description BLOOD LEFT ARM  Final   Special Requests   Final    BOTTLES DRAWN AEROBIC AND ANAEROBIC Blood Culture adequate volume   Culture   Final    NO GROWTH 5 DAYS Performed at [facility], [address], [place], [place] [address]    Report Status 03/27/2020 FINAL  Final  MRSA PCR Screening     Status: None   Collection Time: 03/22/20 11:10 PM   Specimen: Nasal Mucosa; Nasopharyngeal  Result Value Ref Range Status   MRSA by PCR NEGATIVE NEGATIVE Final    Comment:        The GeneXpert MRSA Assay (FDA approved for NASAL specimens only), is one component of a comprehensive MRSA colonization surveillance program. It is not intended to diagnose MRSA infection nor to guide or monitor treatment for MRSA infections. Performed at [facility], [address] [place], [place] [address]       Studies: DG CHEST PORT 1 VIEW  Result Date: 03/27/2020 CLINICAL DATA:  Shortness of breath EXAM: PORTABLE CHEST 1 VIEW COMPARISON:   03/22/2020 FINDINGS: Small right pleural effusion. Worsened bilateral interstitial opacity, likely pulmonary edema. Normal cardiomediastinal contours. IMPRESSION: Worsened bilateral interstitial opacities, likely pulmonary edema. Small right pleural effusion. Electronically Signed   By: Deatra Robinson M.D.   On: 03/27/2020 00:34    Scheduled Meds: . sodium chloride   Intravenous Once  . feeding supplement (ENSURE ENLIVE)  237 mL Oral TID BM  . feeding supplement (PRO-STAT SUGAR FREE 64)  30 mL Oral BID    Continuous Infusions: . sodium chloride 10 mL/hr at 03/27/20 1643  LOS: 5 days     Annita Brod, MD [facility]   03/27/2020, 5:15 PM

## 2020-03-28 DIAGNOSIS — E663 Overweight: Secondary | ICD-10-CM | POA: Diagnosis present

## 2020-03-28 LAB — COMPREHENSIVE METABOLIC PANEL
ALT: 78 U/L — ABNORMAL HIGH (ref 0–44)
AST: 74 U/L — ABNORMAL HIGH (ref 15–41)
Albumin: 1.9 g/dL — ABNORMAL LOW (ref 3.5–5.0)
Alkaline Phosphatase: 91 U/L (ref 38–126)
Anion gap: 15 (ref 5–15)
BUN: 127 mg/dL — ABNORMAL HIGH (ref 8–23)
CO2: 17 mmol/L — ABNORMAL LOW (ref 22–32)
Calcium: 8.3 mg/dL — ABNORMAL LOW (ref 8.9–10.3)
Chloride: 98 mmol/L (ref 98–111)
Creatinine, Ser: 5.23 mg/dL — ABNORMAL HIGH (ref 0.61–1.24)
GFR calc Af Amer: 11 mL/min — ABNORMAL LOW (ref >60)
GFR calc non Af Amer: 9 mL/min — ABNORMAL LOW (ref >60)
Glucose, Bld: 102 mg/dL — ABNORMAL HIGH (ref 70–99)
Potassium: 4.8 mmol/L (ref 3.5–5.1)
Sodium: 130 mmol/L — ABNORMAL LOW (ref 135–145)
Total Bilirubin: 0.6 mg/dL (ref 0.3–1.2)
Total Protein: 5.8 g/dL — ABNORMAL LOW (ref 6.5–8.1)

## 2020-03-28 MED ORDER — MORPHINE SULFATE (PF) 2 MG/ML IV SOLN
1.0000 mg | INTRAVENOUS | Status: DC | PRN
  Administered 2020-03-28 – 2020-03-29 (×4): 1 mg via INTRAVENOUS
  Filled 2020-03-28 (×4): qty 1

## 2020-03-28 MED ORDER — LORAZEPAM 2 MG/ML PO CONC: 1.0000 mg | ORAL | 0 refills | Status: AC | PRN

## 2020-03-28 MED ORDER — MORPHINE SULFATE (CONCENTRATE) 10 MG/0.5ML PO SOLN: 10.0000 mg | ORAL | Status: AC | PRN

## 2020-03-28 NOTE — TOC Progression Note (Signed)
Transition of Care [facility]) - Progression Note    Patient Details  Name: Jeffrey Ho MRN: [id number] Date of Birth: 08-31-1935  Transition of Care [facility]) CM/SW Contact  Beckie Busing, RN Phone Number: [phone]  03/28/2020, 11:24 AM  Clinical Narrative:    CM received referral for residential hospice placement at [facility]. CM called and spoke with Cassandra who states that there is a waiting list. Elonda Husky states that she will call CM back later today after reviewing patients records and speaking with the spouse.    Expected Discharge Plan: Home w Home Health Services Barriers to Discharge: Continued Medical Work up  Expected Discharge Plan and Services Expected Discharge Plan: Home w Home Health Services   Discharge Planning Services: CM Consult Post Acute Care Choice: Home Health Living arrangements for the past 2 months: Single Family Home                                       Social Determinants of Health (SDOH) Interventions    Readmission Risk Interventions Readmission Risk Prevention Plan 03/27/2020  Transportation Screening Complete  Medication Review [employer]) Referral to Pharmacy  PCP or Specialist appointment within 3-5 days of discharge Complete  HRI or Home Care Consult Complete  SW Recovery Care/Counseling Consult Complete  Palliative Care Screening Complete  Skilled Nursing Facility Not Complete  SNF Comments MD requesting that CM wait until after palliative has seen pt. due to potential hospice appropiate  Some recent data might be hidden

## 2020-03-28 NOTE — Discharge Summary (Addendum)
Discharge Summary  Jeffrey Ho EXB:[id number] DOB: [birth year]  PCP: [name], MD  Admit date: 03/22/2020 Anticipated discharge date: 03/29/2020  Time spent: 25 minutes  Recommendations for Outpatient Follow-up:  1. Patient is to be discharged to a residential hospice facility. 2. All home medications discontinued.  Patient being discharged on sublingual Roxanol and Ativan suspension as needed.  These medications may be adjusted as per hospice.  Discharge Diagnoses:  Active Hospital Problems   Diagnosis Date Noted  . Failure to thrive in adult 02/11/2020  . Overweight (BMI 25.0-29.9) 03/28/2020  . Dyspnea   . Agitation   . PMR (polymyalgia rheumatica) (HCC) 02/23/2020  . AKI (acute kidney injury) (HCC) 02/21/2020  . Hypoalbuminemia due to protein-calorie malnutrition (HCC) 01/23/2020  . Hyponatremia 01/23/2020  . Leukocytosis 01/23/2020  . CAP (community acquired pneumonia) 12/31/2019  . HTN (hypertension) 12/31/2019  . Hyperlipidemia 12/31/2019    Resolved Hospital Problems  No resolved problems to display.    Discharge Condition: Patient is actively dying.  I suspect that he likely will expire within the next 2 weeks  Diet recommendation: Comfort feeds only  Vitals:   03/27/20 0738 03/27/20 2059  BP: (!) 145/76 (!) 145/88  Pulse: 100 94  Resp: 16   Temp: 98.6 F (37 C) 97.6 F (36.4 C)  SpO2: 99% 94%    History of present illness:  85 year old with past medical history of hypertension, hyperlipidemia and carotid artery occlusion although overall medically stable with little issues up until approximately 6 to 9 months ago who has had multiple hospitalizations over the last 6 months for decreased p.o. intake.  Admitted on 6/18 for failure to thrive along with confusion, acute kidney injury, hyponatremia and suspected pneumonia after noted to have leukocytosis.  Hospital Course:  Patient found to be very malnourished.  Attempts to hydrate have only led to  third spacing with no improvement with IV albumin/Lasix.  Renal function has continued to decline.  Patient's mentation has continued to worsen as well.  After discussion with patient's wife today and then follow-up with palliative care, patient's wife has decided to make him comfort care.  Waiting for bed at residential hospice. Principal Problem:   Failure to thrive in adult: As below.  Now comfort care. Active Problems:   HTN (hypertension)   CAP (community acquired pneumonia): X-ray unrevealing on admission.  No evidence of hypoxia.  No other source of infection seen.  Procalcitonin elevated so started on some antibiotics.  White count has continued to worsen despite antibiotics and I suspect that this may be more secondary to stress margination from worsening condition.    Antibiotics stopped now that he is comfort care.  Lactic acidosis seen on admission: Despite leukocytosis, suspect lactic acid is more from severe intravascular volume depletion.    Hyperlipidemia: Home meds stopped now that he is comfort care.    Hyponatremia: Likely in part due to dehydration.  Overweight: Patient meets criteria BMI greater than 25    Hypoalbuminemia due to severe protein-calorie malnutrition [facility]): He with IV albumin, albumin only minimally improved.  Third spacing all fluids.  IV Lasix and albumin has done little to improve this as well.  Mentation worsening and is unable to take p.o.  Now comfort care.    Leukocytosis: Suspect stress margination from worsening renal function and overall decline.    AKI (acute kidney injury) (HCC) in the setting of recent stage III chronic kidney disease: Patient's renal function back in April was normal.  During last  hospitalization in May, baseline around 1.5.  Presented to the emergency room with creatinine around 3.5.  Since that time, has steadily declined.  Not really responded to IV fluids given his third spacing.  Now comfort care.    PMR (polymyalgia  rheumatica) (HCC)   Dyspnea   Agitation  Acute metabolic encephalopathy: Suspect large part of this is uremia.  Now comfort care.  Procedures:  None  Consultations:  Palliative care  Discharge Exam: BP (!) 145/88   Pulse 94   Temp 97.6 F (36.4 C) (Axillary)   Resp 16   Ht 6' (1.829 m)   Wt 92.1 kg   SpO2 94%   BMI 27.53 kg/m   General: Disoriented, delirious Cardiovascular: Regular rate and rhythm, S1-S2 Respiratory: Labored breathing, mostly upper airway noise  Discharge Instructions You were cared for by a hospitalist during your hospital stay. If you have any questions about your discharge medications or the care you received while you were in the hospital after you are discharged, you can call the unit and asked to speak with the hospitalist on call if the hospitalist that took care of you is not available. Once you are discharged, your primary care physician will handle any further medical issues. Please note that NO REFILLS for any discharge medications will be authorized once you are discharged, as it is imperative that you return to your primary care physician (or establish a relationship with a primary care physician if you do not have one) for your aftercare needs so that they can reassess your need for medications and monitor your lab values.   Allergies as of 03/28/2020   No Known Allergies     Medication List    STOP taking these medications   aspirin 81 MG tablet   atorvastatin 20 MG tablet Commonly known as: LIPITOR   clotrimazole-betamethasone cream Commonly known as: LOTRISONE   feeding supplement (ENSURE ENLIVE) Liqd   ferrous sulfate 325 (65 FE) MG tablet   fish oil-omega-3 fatty acids 1000 MG capsule   folic acid 1 MG tablet Commonly known as: FOLVITE   furosemide 40 MG tablet Commonly known as: LASIX   Linzess 72 MCG capsule Generic drug: linaclotide   metoprolol tartrate 25 MG tablet Commonly known as: LOPRESSOR   mirtazapine 15  MG tablet Commonly known as: REMERON   niacin 500 MG tablet   NON FORMULARY   ondansetron 4 MG tablet Commonly known as: ZOFRAN     TAKE these medications   LORazepam 2 MG/ML concentrated solution Commonly known as: ATIVAN Take 0.5 mLs (1 mg total) by mouth every 4 (four) hours as needed (agitation).   morphine CONCENTRATE 10 MG/0.5ML Soln concentrated solution Place 0.5 mLs (10 mg total) under the tongue every 4 (four) hours as needed for severe pain.      No Known Allergies  Follow-up Information    [place], [facility] Follow up.   Why: for home health services to resume Contact information: [address] [place] [address] [phone]                The results of significant diagnostics from this hospitalization (including imaging, microbiology, ancillary and laboratory) are listed below for reference.    Significant Diagnostic Studies: DG Chest 2 View  Result Date: 03/22/2020 CLINICAL DATA:  Suspected sepsis. EXAM: CHEST - 2 VIEW COMPARISON:  02/11/2020.  CT 01/23/2020. FINDINGS: Mediastinum and hilar structures normal. Heart size normal. Bilateral small of infiltrate and or atelectasis/scarring  again noted. Similar findings noted on prior exam. New mild left mid lung field subsegmental atelectasis/infiltrate. IMPRESSION: 1. Bilateral small pulmonary densities consistent with mild infiltrates and or atelectasis/scarring again noted. 2. New mild left mid lung field subsegmental atelectasis/infiltrate. Electronically Signed   By: Marcello Moores  Register   On: 03/22/2020 13:15   CT HEAD WO CONTRAST  Result Date: 03/22/2020 CLINICAL DATA:  Left-sided facial droop EXAM: CT HEAD WITHOUT CONTRAST TECHNIQUE: Contiguous axial images were obtained from the base of the skull through the vertex without intravenous contrast. COMPARISON:  02/16/2009, 02/11/2020 FINDINGS: Brain: No evidence of acute infarction, hemorrhage, hydrocephalus, extra-axial  collection or mass lesion/mass effect. Scattered low-density changes within the periventricular and subcortical white matter compatible with chronic microvascular ischemic change. Moderate diffuse cerebral volume loss. Vascular: Atherosclerotic calcifications involving the large vessels of the skull base. No unexpected hyperdense vessel. Skull: Normal. Negative for fracture or focal lesion. Sinuses/Orbits: Air-fluid level within the left sphenoid sinus and scattered ethmoid air cell opacification. Orbital structures intact. Other: None. IMPRESSION: 1. No acute intracranial findings. 2. Chronic microvascular ischemic change and cerebral volume loss. 3. Air-fluid level within the left sphenoid sinus and scattered ethmoid air cell opacification. Correlate for signs and symptoms of acute sinusitis. Electronically Signed   By: Davina Poke D.O.   On: 03/22/2020 14:42   CT ABDOMEN PELVIS W CONTRAST  Result Date: 02/27/2020 CLINICAL DATA:  Abdominal pain. Constipation. Possible ileus on abdominal x-ray earlier today. EXAM: CT ABDOMEN AND PELVIS WITH CONTRAST TECHNIQUE: Multidetector CT imaging of the abdomen and pelvis was performed using the standard protocol following bolus administration of intravenous contrast. CONTRAST:  48mL OMNIPAQUE IOHEXOL 300 MG/ML  SOLN COMPARISON:  CT 2 and half weeks ago 02/11/2020. FINDINGS: Lower chest: Improved pleural effusions from prior abdominal CT, small residual on the right. Heart is normal in size. Hepatobiliary: No focal hepatic lesion. Small gallstones without pericholecystic inflammation or biliary dilatation. Pancreas: No ductal dilatation or inflammation. Spleen: Normal in size without focal abnormality. Adrenals/Urinary Tract: Normal adrenal glands. No hydronephrosis or perinephric edema. Homogeneous renal enhancement with symmetric excretion on delayed phase imaging. Urinary bladder is physiologically distended without wall thickening. Stomach/Bowel: There is diffuse  colonic distension. Liquid stool with air-fluid levels involves the cecum, ascending, and transverse colon with associated gaseous distension. More formed stool is seen in the descending and sigmoid colon. Sigmoid colon is redundant. Scattered sigmoid colonic diverticula without acute diverticulitis. Inspissated stool ball within the rectum with rectal distention of 8.2 cm. There is circumferential rectal wall thickening. No definite perirectal edema. Scattered foci of air adjacent to stool ball in the rectum may be adjacent to large stool ball versus small foci of pneumatosis. There is no evidence of perforation or extraluminal air. The appendix is partially obscured by small volume of fluid in the right pericolic gutter, no evidence of appendicitis. There is fecalization of scattered small bowel contents without abnormal small bowel distention or small bowel obstruction. Stomach is unremarkable. Vascular/Lymphatic: Advanced aortic and branch atherosclerosis. Suspected stenosis at the origin of the celiac and SMA arteries due to calcified plaque. There is no mesenteric or portal venous gas. Portal vein is patent. No adenopathy. Reproductive: Prostate is unremarkable. Other: Small amount of free fluid in the right pericolic gutter. No free air or perforation. No intra-abdominal abscess. Minimal fat in the inguinal canals. Musculoskeletal: Diffuse degenerative change throughout the spine. There are no acute or suspicious osseous abnormalities. IMPRESSION: 1. Inspissated stool ball within the rectum with rectal distention of 8.2  cm, consistent with fecal impaction. There is circumferential rectal wall thickening which may represent stercoral proctitis, however no definite perirectal edema. Scattered foci of air adjacent to the large stool ball in the rectum may be air in the bowel lumen versus small foci of pneumatosis. There is no evidence of perforation or extraluminal air. 2. Diffuse colonic distension, formed  stool in the descending and sigmoid colon, with liquid stool and air proximally. No small bowel obstruction. 3. Small amount of free fluid in the right pericolic gutter is likely reactive. 4. Cholelithiasis without gallbladder inflammation. 5. Improved pleural effusions from prior abdominal CT, small residual on the right. 6. Advanced aortic atherosclerosis. 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 Aortic Atherosclerosis (ICD10-I70.0). Electronically Signed   By: Narda Rutherford M.D.   On: 02/27/2020 22:32   DG CHEST PORT 1 VIEW  Result Date: 03/27/2020 CLINICAL DATA:  Shortness of breath EXAM: PORTABLE CHEST 1 VIEW COMPARISON:  03/22/2020 FINDINGS: Small right pleural effusion. Worsened bilateral interstitial opacity, likely pulmonary edema. Normal cardiomediastinal contours. IMPRESSION: Worsened bilateral interstitial opacities, likely pulmonary edema. Small right pleural effusion. Electronically Signed   By: Deatra Robinson M.D.   On: 03/27/2020 00:34    Microbiology: Recent Results (from the past 240 hour(s))  Culture, blood (Routine x 2)     Status: None   Collection Time: 03/22/20 12:11 PM   Specimen: BLOOD RIGHT HAND  Result Value Ref Range Status   Specimen Description BLOOD RIGHT HAND  Final   Special Requests   Final    BOTTLES DRAWN AEROBIC AND ANAEROBIC Blood Culture adequate volume   Culture   Final    NO GROWTH 5 DAYS Performed at [facility], [address], [place], [place] [address]    Report Status 03/27/2020 FINAL  Final  SARS Coronavirus 2 by RT PCR (hospital order, performed in [facility] hospital lab) Nasopharyngeal Nasopharyngeal Swab     Status: None   Collection Time: 03/22/20  5:22 PM   Specimen: Nasopharyngeal Swab  Result Value Ref Range Status   SARS Coronavirus 2 NEGATIVE NEGATIVE Final    Comment: (NOTE) SARS-CoV-2 target nucleic acids are NOT DETECTED.  The SARS-CoV-2 RNA is generally detectable in  upper and lower respiratory specimens during the acute phase of infection. The lowest concentration of SARS-CoV-2 viral copies this assay can detect is 250 copies / mL. A negative result does not preclude SARS-CoV-2 infection and should not be used as the sole basis for treatment or other patient management decisions.  A negative result may occur with improper specimen collection / handling, submission of specimen other than nasopharyngeal swab, presence of viral mutation(s) within the areas targeted by this assay, and inadequate number of viral copies (<250 copies / mL). A negative result must be combined with clinical observations, patient history, and epidemiological information.  Fact Sheet for Patients:   BoilerBrush.com.cy  Fact Sheet for Healthcare Providers: https://pope.com/  This test is not yet approved or  cleared by the [place] FDA and has been authorized for detection and/or diagnosis of SARS-CoV-2 by FDA under an Emergency Use Authorization (EUA).  This EUA will remain in effect (meaning this test can be used) for the duration of the COVID-19 declaration under Section 564(b)(1) of the Act, 21 U.S.C. section 360bbb-3(b)(1), unless the authorization is terminated or revoked sooner.  Performed at [facility], [address] [place], [place] [address]   Culture, blood (Routine x  2)     Status: None   Collection Time: 03/22/20  8:50 PM   Specimen: BLOOD LEFT ARM  Result Value Ref Range Status   Specimen Description BLOOD LEFT ARM  Final   Special Requests   Final    BOTTLES DRAWN AEROBIC AND ANAEROBIC Blood Culture adequate volume   Culture   Final    NO GROWTH 5 DAYS Performed at [facility], [address] [place], [place] [address]    Report Status 03/27/2020 FINAL  Final  MRSA PCR Screening     Status: None   Collection Time: 03/22/20 11:10 PM   Specimen: Nasal Mucosa; Nasopharyngeal  Result  Value Ref Range Status   MRSA by PCR NEGATIVE NEGATIVE Final    Comment:        The GeneXpert MRSA Assay (FDA approved for NASAL specimens only), is one component of a comprehensive MRSA colonization surveillance program. It is not intended to diagnose MRSA infection nor to guide or monitor treatment for MRSA infections. Performed at [facility], [address] [place], [place] [address]      Labs: Basic Metabolic Panel: Recent Labs  Lab 03/24/20 [phone] 03/25/20 0357 03/26/20 0338 03/27/20 0326 03/28/20 0322  NA 130* 128* 127* 130* 130*  K 4.6 4.3 4.1 4.5 4.8  CL 95* 96* 97* 96* 98  CO2 24 21* 19* 19* 17*  GLUCOSE 97 121* 97 135* 102*  BUN 83* 87* 98* 114* 127*  CREATININE 3.80* 4.03* 4.22* 4.64* 5.23*  CALCIUM 7.7* 7.6* 7.8* 8.5* 8.3*   Liver Function Tests: Recent Labs  Lab 03/24/20 0626 03/25/20 0357 03/26/20 0338 03/27/20 0326 03/28/20 0322  AST 53* 111* 102* 89* 74*  ALT 49* 77* 83* 80* 78*  ALKPHOS 86 112 111 112 91  BILITOT 0.6 0.6 0.5 0.5 0.6  PROT 5.5* 5.3* 5.5* 6.2* 5.8*  ALBUMIN 1.3* 1.2* 1.7* 2.2* 1.9*   No results for input(s): LIPASE, AMYLASE in the last 168 hours. Recent Labs  Lab 03/22/20 1558  AMMONIA 12   CBC: Recent Labs  Lab 03/22/20 1210 03/22/20 1238 03/23/20 1129 03/24/20 0626 03/25/20 0357 03/26/20 0338 03/27/20 0326  WBC 25.4*   < > 23.9* 24.8* 24.6* 22.1* 31.7*  NEUTROABS 22.9*  --  20.9*  --   --   --   --   HGB 7.0*   < > 8.7* 8.3* 7.7* 7.3* 7.9*  HCT 23.1*   < > 26.9* 25.2* 24.0* 23.3* 24.8*  MCV 87.2   < > 85.4 83.7 84.2 87.6 84.9  PLT 480*   < > 388 454* 331 338 344   < > = values in this interval not displayed.   Cardiac Enzymes: No results for input(s): CKTOTAL, CKMB, CKMBINDEX, TROPONINI in the last 168 hours. BNP: BNP (last 3 results) Recent Labs    02/17/20 0353 02/18/20 0420 02/19/20 0428  BNP 211.3* 151.9* 119.9*    ProBNP (last 3 results) No results for input(s): PROBNP in the last 8760  hours.  CBG: Recent Labs  Lab 03/22/20 1446  GLUCAP 106*       Signed:  Hollice EspySendil K Ermalee Mealy, MD [facility] 03/28/2020, 5:09 PM

## 2020-03-29 DIAGNOSIS — Z7189 Other specified counseling: Secondary | ICD-10-CM

## 2020-03-29 DIAGNOSIS — Z515 Encounter for palliative care: Secondary | ICD-10-CM

## 2020-03-29 NOTE — Discharge Summary (Deleted)
Discharge Summary  Jeffrey Ho E Jozwiak ZOX:[id number] DOB: 08/17/1935  PCP: Elfredia NevinsFusco, Lawrence, MD  Admit date: 03/22/2020 Anticipated discharge date: 03/29/2020  Time spent: 25 minutes  Recommendations for Outpatient Follow-up:  1. Patient is to be discharged to a residential hospice facility. 2. All home medications discontinued.  Patient being discharged on sublingual Roxanol and Ativan suspension as needed.  These medications may be adjusted as per hospice.  Discharge Diagnoses:  Active Hospital Problems   Diagnosis Date Noted  . Failure to thrive in adult 02/11/2020  . Overweight (BMI 25.0-29.9) 03/28/2020  . Dyspnea   . Agitation   . PMR (polymyalgia rheumatica) (HCC) 02/23/2020  . AKI (acute kidney injury) (HCC) 02/21/2020  . Hypoalbuminemia due to protein-calorie malnutrition (HCC) 01/23/2020  . Hyponatremia 01/23/2020  . Leukocytosis 01/23/2020  . CAP (community acquired pneumonia) 12/31/2019  . HTN (hypertension) 12/31/2019  . Hyperlipidemia 12/31/2019    Resolved Hospital Problems  No resolved problems to display.    Discharge Condition: Patient is actively dying. Moribund  Diet recommendation: Comfort feeds only  Vitals:   03/27/20 2059 03/28/20 2152  BP: (!) 145/88 (!) 166/68  Pulse: 94 94  Resp:  20  Temp: 97.6 F (36.4 C) 98 F (36.7 C)  SpO2: 94% 100%   Exam:  Constitutional:  . The patient is moribund. No acute distress. Respiratory:  . No increased work of breathing. . No wheezes, rales, or rhonchi . No tactile fremitus Cardiovascular:  . Regular rate and rhythm . No murmurs, ectopy, or gallups. . No lateral PMI. No thrills. Abdomen:  . Abdomen is soft, non-tender, non-distended . No hernias, masses, or organomegaly . Normoactive bowel sounds.  Musculoskeletal:  . No cyanosis, clubbing, or edema Skin:  . No rashes, lesions, ulcers . palpation of skin: no induration or nodules Neurologic:  . Unable to evaluate due to the patient's  inability to cooperate with exam. Psychiatric:  Unable to evaluate due to the patient's inability to cooperate with exam.  History of present illness:  85 year old with past medical history of hypertension, hyperlipidemia and carotid artery occlusion although overall medically stable with little issues up until approximately 6 to 9 months ago who has had multiple hospitalizations over the last 6 months for decreased p.o. intake.  Admitted on 6/18 for failure to thrive along with confusion, acute kidney injury, hyponatremia and suspected pneumonia after noted to have leukocytosis.  Hospital Course:  Patient found to be very malnourished.  Attempts to hydrate have only led to third spacing with no improvement with IV albumin/Lasix.  Renal function has continued to decline.  Patient's mentation has continued to worsen as well.  After discussion with patient's wife today and then follow-up with palliative care, patient's wife has decided to make him comfort care.  Waiting for bed at residential hospice. Principal Problem:   Failure to thrive in adult: As below.  Now comfort care. Active Problems:   HTN (hypertension)   CAP (community acquired pneumonia): X-ray unrevealing on admission.  No evidence of hypoxia.  No other source of infection seen.  Procalcitonin elevated so started on some antibiotics.  White count has continued to worsen despite antibiotics and I suspect that this may be more secondary to stress margination from worsening condition.    Antibiotics stopped now that he is comfort care.  Lactic acidosis seen on admission: Despite leukocytosis, suspect lactic acid is more from severe intravascular volume depletion.    Hyperlipidemia: Home meds stopped now that he is comfort care.  Hyponatremia: Likely in part due to dehydration.  Overweight: Patient meets criteria BMI greater than 25    Hypoalbuminemia due to severe protein-calorie malnutrition [facility]): He with IV albumin, albumin  only minimally improved.  Third spacing all fluids.  IV Lasix and albumin has done little to improve this as well.  Mentation worsening and is unable to take p.o.  Now comfort care.    Leukocytosis: Suspect stress margination from worsening renal function and overall decline.    AKI (acute kidney injury) (HCC) in the setting of recent stage III chronic kidney disease: Patient's renal function back in April was normal.  During last hospitalization in May, baseline around 1.5.  Presented to the emergency room with creatinine around 3.5.  Since that time, has steadily declined.  Not really responded to IV fluids given his third spacing.  Now comfort care.    PMR (polymyalgia rheumatica) (HCC)   Dyspnea   Agitation  Acute metabolic encephalopathy: Suspect large part of this is uremia.  Now comfort care and awaiting a bed at a residential hospice facility.  Procedures:  None  Consultations:  Palliative care  Discharge Exam: BP (!) 166/68 (BP Location: Left Arm)   Pulse 94   Temp 98 F (36.7 C)   Resp 20   Ht 6' (1.829 m)   Wt 92.1 kg   SpO2 100%   BMI 27.53 kg/m   General: Disoriented, delirious Cardiovascular: Regular rate and rhythm, S1-S2 Respiratory: Labored breathing, mostly upper airway noise  Discharge Instructions You were cared for by a hospitalist during your hospital stay. If you have any questions about your discharge medications or the care you received while you were in the hospital after you are discharged, you can call the unit and asked to speak with the hospitalist on call if the hospitalist that took care of you is not available. Once you are discharged, your primary care physician will handle any further medical issues. Please note that NO REFILLS for any discharge medications will be authorized once you are discharged, as it is imperative that you return to your primary care physician (or establish a relationship with a primary care physician if you do not have  one) for your aftercare needs so that they can reassess your need for medications and monitor your lab values.  Discharge Instructions    Activity as tolerated - No restrictions   Complete by: As directed    Call MD for:  severe uncontrolled pain   Complete by: As directed    Diet general   Complete by: As directed    Pleasure feeds only.   Discharge instructions   Complete by: As directed    End of life care   Increase activity slowly   Complete by: As directed      Allergies as of 03/29/2020   No Known Allergies     Medication List    STOP taking these medications   aspirin 81 MG tablet   atorvastatin 20 MG tablet Commonly known as: LIPITOR   clotrimazole-betamethasone cream Commonly known as: LOTRISONE   feeding supplement (ENSURE ENLIVE) Liqd   ferrous sulfate 325 (65 FE) MG tablet   fish oil-omega-3 fatty acids 1000 MG capsule   folic acid 1 MG tablet Commonly known as: FOLVITE   furosemide 40 MG tablet Commonly known as: LASIX   Linzess 72 MCG capsule Generic drug: linaclotide   metoprolol tartrate 25 MG tablet Commonly known as: LOPRESSOR   mirtazapine 15 MG tablet Commonly known as:  REMERON   niacin 500 MG tablet   NON FORMULARY   ondansetron 4 MG tablet Commonly known as: ZOFRAN     TAKE these medications   LORazepam 2 MG/ML concentrated solution Commonly known as: ATIVAN Take 0.5 mLs (1 mg total) by mouth every 4 (four) hours as needed (agitation).   morphine CONCENTRATE 10 MG/0.5ML Soln concentrated solution Place 0.5 mLs (10 mg total) under the tongue every 4 (four) hours as needed for severe pain.      No Known Allergies  Follow-up Information    [place] Follow up.   Why: for home health services to resume Contact information: [address] [place] [address] [phone]               The results of significant diagnostics from this hospitalization (including imaging,  microbiology, ancillary and laboratory) are listed below for reference.    Significant Diagnostic Studies: DG Chest 2 View  Result Date: 03/22/2020 CLINICAL DATA:  Suspected sepsis. EXAM: CHEST - 2 VIEW COMPARISON:  02/11/2020.  CT 01/23/2020. FINDINGS: Mediastinum and hilar structures normal. Heart size normal. Bilateral small of infiltrate and or atelectasis/scarring again noted. Similar findings noted on prior exam. New mild left mid lung field subsegmental atelectasis/infiltrate. IMPRESSION: 1. Bilateral small pulmonary densities consistent with mild infiltrates and or atelectasis/scarring again noted. 2. New mild left mid lung field subsegmental atelectasis/infiltrate. Electronically Signed   By: Maisie Fus  Register   On: 03/22/2020 13:15   CT HEAD WO CONTRAST  Result Date: 03/22/2020 CLINICAL DATA:  Left-sided facial droop EXAM: CT HEAD WITHOUT CONTRAST TECHNIQUE: Contiguous axial images were obtained from the base of the skull through the vertex without intravenous contrast. COMPARISON:  02/16/2009, 02/11/2020 FINDINGS: Brain: No evidence of acute infarction, hemorrhage, hydrocephalus, extra-axial collection or mass lesion/mass effect. Scattered low-density changes within the periventricular and subcortical white matter compatible with chronic microvascular ischemic change. Moderate diffuse cerebral volume loss. Vascular: Atherosclerotic calcifications involving the large vessels of the skull base. No unexpected hyperdense vessel. Skull: Normal. Negative for fracture or focal lesion. Sinuses/Orbits: Air-fluid level within the left sphenoid sinus and scattered ethmoid air cell opacification. Orbital structures intact. Other: None. IMPRESSION: 1. No acute intracranial findings. 2. Chronic microvascular ischemic change and cerebral volume loss. 3. Air-fluid level within the left sphenoid sinus and scattered ethmoid air cell opacification. Correlate for signs and symptoms of acute sinusitis. Electronically  Signed   By: Duanne Guess D.O.   On: 03/22/2020 14:42   DG CHEST PORT 1 VIEW  Result Date: 03/27/2020 CLINICAL DATA:  Shortness of breath EXAM: PORTABLE CHEST 1 VIEW COMPARISON:  03/22/2020 FINDINGS: Small right pleural effusion. Worsened bilateral interstitial opacity, likely pulmonary edema. Normal cardiomediastinal contours. IMPRESSION: Worsened bilateral interstitial opacities, likely pulmonary edema. Small right pleural effusion. Electronically Signed   By: Deatra Robinson M.D.   On: 03/27/2020 00:34    Microbiology: Recent Results (from the past 240 hour(s))  Culture, blood (Routine x 2)     Status: None   Collection Time: 03/22/20 12:11 PM   Specimen: BLOOD RIGHT HAND  Result Value Ref Range Status   Specimen Description BLOOD RIGHT HAND  Final   Special Requests   Final    BOTTLES DRAWN AEROBIC AND ANAEROBIC Blood Culture adequate volume   Culture   Final    NO GROWTH 5 DAYS Performed at [facility], [address], [place], [place] [address]    Report Status 03/27/2020 FINAL  Final  SARS Coronavirus 2 by RT PCR (hospital order, performed in [facility] hospital lab) Nasopharyngeal Nasopharyngeal Swab     Status: None   Collection Time: 03/22/20  5:22 PM   Specimen: Nasopharyngeal Swab  Result Value Ref Range Status   SARS Coronavirus 2 NEGATIVE NEGATIVE Final    Comment: (NOTE) SARS-CoV-2 target nucleic acids are NOT DETECTED.  The SARS-CoV-2 RNA is generally detectable in upper and lower respiratory specimens during the acute phase of infection. The lowest concentration of SARS-CoV-2 viral copies this assay can detect is 250 copies / mL. A negative result does not preclude SARS-CoV-2 infection and should not be used as the sole basis for treatment or other patient management decisions.  A negative result may occur with improper specimen collection / handling, submission of specimen other than nasopharyngeal swab, presence of viral mutation(s) within the areas  targeted by this assay, and inadequate number of viral copies (<250 copies / mL). A negative result must be combined with clinical observations, patient history, and epidemiological information.  Fact Sheet for Patients:   BoilerBrush.com.cy  Fact Sheet for Healthcare Providers: https://pope.com/  This test is not yet approved or  cleared by the [place] FDA and has been authorized for detection and/or diagnosis of SARS-CoV-2 by FDA under an Emergency Use Authorization (EUA).  This EUA will remain in effect (meaning this test can be used) for the duration of the COVID-19 declaration under Section 564(b)(1) of the Act, 21 U.S.C. section 360bbb-3(b)(1), unless the authorization is terminated or revoked sooner.  Performed at [facility], [address] [place], [place] [address]   Culture, blood (Routine x 2)     Status: None   Collection Time: 03/22/20  8:50 PM   Specimen: BLOOD LEFT ARM  Result Value Ref Range Status   Specimen Description BLOOD LEFT ARM  Final   Special Requests   Final    BOTTLES DRAWN AEROBIC AND ANAEROBIC Blood Culture adequate volume   Culture   Final    NO GROWTH 5 DAYS Performed at [facility], [address], [place], [place] [address]    Report Status 03/27/2020 FINAL  Final  MRSA PCR Screening     Status: None   Collection Time: 03/22/20 11:10 PM   Specimen: Nasal Mucosa; Nasopharyngeal  Result Value Ref Range Status   MRSA by PCR NEGATIVE NEGATIVE Final    Comment:        The GeneXpert MRSA Assay (FDA approved for NASAL specimens only), is one component of a comprehensive MRSA colonization surveillance program. It is not intended to diagnose MRSA infection nor to guide or monitor treatment for MRSA infections. Performed at [facility], [address] [place], [place] [address]      Labs: Basic Metabolic Panel: Recent Labs  Lab 03/24/20 [phone] 03/25/20 0357  03/26/20 0338 03/27/20 0326 03/28/20 0322  NA 130* 128* 127* 130* 130*  K 4.6 4.3 4.1 4.5 4.8  CL 95* 96* 97* 96* 98  CO2 24 21* 19* 19* 17*  GLUCOSE 97 121* 97 135* 102*  BUN 83* 87* 98* 114* 127*  CREATININE 3.80* 4.03* 4.22* 4.64* 5.23*  CALCIUM 7.7* 7.6* 7.8* 8.5* 8.3*   Liver Function Tests: Recent Labs  Lab 03/24/20 0626 03/25/20 0357 03/26/20 0338 03/27/20 0326 03/28/20 0322  AST 53* 111* 102* 89* 74*  ALT 49* 77* 83* 80* 78*  ALKPHOS 86 112 111 112 91  BILITOT 0.6 0.6 0.5 0.5 0.6  PROT 5.5*  5.3* 5.5* 6.2* 5.8*  ALBUMIN 1.3* 1.2* 1.7* 2.2* 1.9*   No results for input(s): LIPASE, AMYLASE in the last 168 hours. Recent Labs  Lab 03/22/20 1558  AMMONIA 12   CBC: Recent Labs  Lab 03/22/20 1210 03/22/20 1238 03/23/20 1129 03/24/20 0626 03/25/20 0357 03/26/20 0338 03/27/20 0326  WBC 25.4*   < > 23.9* 24.8* 24.6* 22.1* 31.7*  NEUTROABS 22.9*  --  20.9*  --   --   --   --   HGB 7.0*   < > 8.7* 8.3* 7.7* 7.3* 7.9*  HCT 23.1*   < > 26.9* 25.2* 24.0* 23.3* 24.8*  MCV 87.2   < > 85.4 83.7 84.2 87.6 84.9  PLT 480*   < > 388 454* 331 338 344   < > = values in this interval not displayed.   Cardiac Enzymes: No results for input(s): CKTOTAL, CKMB, CKMBINDEX, TROPONINI in the last 168 hours. BNP: BNP (last 3 results) Recent Labs    02/17/20 0353 02/18/20 0420 02/19/20 0428  BNP 211.3* 151.9* 119.9*    ProBNP (last 3 results) No results for input(s): PROBNP in the last 8760 hours.  CBG: Recent Labs  Lab 03/22/20 1446  GLUCAP 106*    I have spent 38 minutes in the evaluation and discharge of this patient.   Signed:  Karie Kirks, MD [facility] 03/29/2020, 9:58 AM

## 2020-03-29 NOTE — TOC Progression Note (Signed)
Transition of Care [facility]) - Progression Note    Patient Details  Name: Jeffrey Ho MRN: [id number] Date of Birth: [birth year]  Transition of Care [facility]) CM/SW Contact  Beckie Busing, RN Phone Number: [phone] 03/29/2020, 9:17 AM  Clinical Narrative:    [facility] to follow up on bed availability.  Spoke with Cassandra who confirms that patient has been accepted and is on the waiting list and they are expecting that a bed will be available at any moment. CM will continue to follow.   Expected Discharge Plan: Home w Home Health Services Barriers to Discharge: Continued Medical Work up  Expected Discharge Plan and Services Expected Discharge Plan: Home w Home Health Services   Discharge Planning Services: CM Consult Post Acute Care Choice: Home Health Living arrangements for the past 2 months: Single Family Home                                       Social Determinants of Health (SDOH) Interventions    Readmission Risk Interventions Readmission Risk Prevention Plan 03/27/2020  Transportation Screening Complete  Medication Review [facility]) Referral to Pharmacy  PCP or Specialist appointment within 3-5 days of discharge Complete  HRI or Home Care Consult Complete  SW Recovery Care/Counseling Consult Complete  Palliative Care Screening Complete  Skilled Nursing Facility Not Complete  SNF Comments MD requesting that CM wait until after palliative has seen pt. due to potential hospice appropiate  Some recent data might be hidden

## 2020-03-29 NOTE — Consult Note (Signed)
   [facility] CM Inpatient Consult   03/29/2020  CORDAI RODRIGUE [birth year] [id number]   [facility] [[facility]]  Accountable Care Organization [ACO] Patient:   Patient screened for disposition with extreme high risk score for unplanned readmission score and for less than 30 day readmission hospitalization.  Review of patient's medical record of the inpatient Transition of Care RNCM and Palliative care notes reveals patient is hospice care for post hospital care. Patient will have full care support at a hospice facility and no [facility] care management needs noted. Plan: Will sign off at transition from the hospital   For questions contact:   Charlesetta Shanks, RN BSN CCM [facility]  [phone] business mobile phone Toll free office [phone]  Fax number: [phone] [email or web address]

## 2020-03-29 NOTE — Care Management Important Message (Signed)
Important Message  Patient Details  Name: AARONMICHAEL BRUMBAUGH MRN: [id number] Date of Birth: 1935-04-11   Medicare Important Message Given:  Yes     Katiya Fike 03/29/2020, 1:58 PM

## 2020-03-29 NOTE — Progress Notes (Signed)
   Palliative Medicine Inpatient Follow Up Note   HPI: Per intake H&P --> Jeffrey Ho a 85 y.o.malewith medical history significant ofright eye blindness, carotid artery occlusion, hard of hearing, hyperlipidemia, hypertension. He has had had five hospitalizations in the past six months. He has had ongoing failure to thrive and physical weakness.  Palliative care was asked to see Jeffrey Ho to further address goals of care.   Today's Discussion (03/29/2020): Chart reviewed. Met with bedside RN who endorsed that patient was not at "zonked" as the day prior as he had not received ativan. Patient did appear comfortable after having receive morphine.   I met with Jeffrey Ho at bedside, she said that Jeffrey Ho has been more interactive today. I asked her how I may help her today though she stated she was doing alright.   Plan for transition to residential hospice today.   Questions and concerns addressed   Recommendations and Plan: -DNR/DNI -Focus of care shifted to focus on comfort, quality and dignity -No further diagnostics, lab sticks -Minimize medications to focus on symptom management -Plan to transition to residential hospice --[place]  Time Spent: 15 Greater than 50% of the time was spent in counseling and coordination of care ______________________________________________________________________________________ [place] Team Team Cell Phone: [phone] Please utilize secure chat with additional questions, if there is no response within 30 minutes please call the above phone number  Palliative Medicine Team providers are available by phone from 7am to 7pm daily and can be reached through the team cell phone.  Should this patient require assistance outside of these hours, please call the patient's attending physician.

## 2020-03-30 MED ORDER — GLYCOPYRROLATE 0.2 MG/ML IJ SOLN
0.4000 mg | INTRAMUSCULAR | Status: DC
  Administered 2020-03-30 – 2020-03-31 (×7): 0.4 mg via INTRAVENOUS
  Filled 2020-03-30 (×8): qty 2

## 2020-03-30 MED ORDER — BISACODYL 10 MG RE SUPP: 10.0000 mg | Freq: Every day | RECTAL | Status: DC | PRN

## 2020-03-30 MED ORDER — BISACODYL 10 MG RE SUPP: 10.0000 mg | Freq: Every day | RECTAL | 0 refills | Status: AC | PRN

## 2020-03-30 MED ORDER — ATROPINE SULFATE 1 % OP SOLN: 2.0000 [drp] | Freq: Three times a day (TID) | OPHTHALMIC | 12 refills | Status: AC

## 2020-03-30 NOTE — Progress Notes (Signed)
PT Cancellation Note  Patient Details Name: Jeffrey Ho MRN: [id number] DOB: 06-01-1935   Cancelled Treatment:    Reason Eval/Treat Not Completed: Other (comment) After discussion with RN, the pt has transitioned to comfort care and is awaiting d/c to hospice at this time so there are no longer acute PT needs. PT will sign off, thank you for letting us be part of this patient's care. Please feel free to re-consult if the pt's status or needs change.   Deland Pretty, DPT   Acute Rehabilitation Department Pager #: [phone]   Gaetana Michaelis 03/30/2020, 12:51 PM

## 2020-03-30 NOTE — Progress Notes (Signed)
PROGRESS NOTE  Jeffrey Ho VFI:[id number] DOB: 06-21-1935 DOA: 03/22/2020 PCP: [name], MD  Brief History   85 year old with past medical history of hypertension, hyperlipidemia and carotid artery occlusion although overall medically stable with little issues up until approximately 6 to 9 months ago who has had multiple hospitalizations over the last 6 months for decreased p.o. intake. Admitted on 6/18 for failure to thrive along with confusion, acute kidney injury, hyponatremia and suspected pneumonia after noted to have leukocytosis.  Hospital Course:  Patient found to be very malnourished. Attempts to hydrate have only led to third spacing with no improvement with IV albumin/Lasix. Renal function has continued to decline. Patient's mentation has continued to worsen as well. After discussion with patient's wife today and then follow-up with palliative care, patient's wife has decided to make him comfort care.  Waiting for bed at residential hospice. Principal Problem: Failure to thrive in adult: As below. Now comfort care. Active Problems: HTN (hypertension) CAP (community acquired pneumonia): X-ray unrevealing on admission. No evidence of hypoxia. No other source of infection seen. Procalcitonin elevated so started on some antibiotics. White count has continued to worsen despite antibiotics and I suspect that this may be more secondary to stress margination from worsening condition.   Antibiotics stopped now that he is comfort care.  Consultants   Palliative care  Procedures   None  Antibiotics   Anti-infectives (From admission, onward)   Start     Dose/Rate Route Frequency Ordered Stop   03/24/20 1800  azithromycin (ZITHROMAX) tablet 500 mg        500 mg Oral Daily 03/24/20 1238 03/26/20 0920   03/23/20 1600  cefTRIAXone (ROCEPHIN) 2 g in sodium chloride 0.9 % 100 mL IVPB  Status:  Discontinued        2 g 200 mL/hr over 30 Minutes Intravenous Every 24  hours 03/22/20 1703 03/27/20 1621   03/22/20 1800  azithromycin (ZITHROMAX) 500 mg in sodium chloride 0.9 % 250 mL IVPB  Status:  Discontinued        500 mg 250 mL/hr over 60 Minutes Intravenous Every 24 hours 03/22/20 1703 03/24/20 1238   03/22/20 1445  ceFEPIme (MAXIPIME) 2 g in sodium chloride 0.9 % 100 mL IVPB        2 g 200 mL/hr over 30 Minutes Intravenous  Once 03/22/20 1434 03/22/20 1941   03/22/20 1445  vancomycin (VANCOREADY) IVPB 1750 mg/350 mL        1,750 mg 175 mL/hr over 120 Minutes Intravenous  Once 03/22/20 1440 03/22/20 2039   03/22/20 1441  vancomycin variable dose per unstable renal function (pharmacist dosing)  Status:  Discontinued         Does not apply See admin instructions 03/22/20 1441 03/23/20 0822   03/22/20 1430  cefTRIAXone (ROCEPHIN) 2 g in sodium chloride 0.9 % 100 mL IVPB  Status:  Discontinued        2 g 200 mL/hr over 30 Minutes Intravenous Every 24 hours 03/22/20 1422 03/22/20 1432   03/22/20 1430  azithromycin (ZITHROMAX) 500 mg in sodium chloride 0.9 % 250 mL IVPB  Status:  Discontinued        500 mg 250 mL/hr over 60 Minutes Intravenous Every 24 hours 03/22/20 1422 03/22/20 1432      Subjective  The patient is resting comfortably. No new complaints. Family at bedside.  Objective   Vitals:  Vitals:   03/28/20 2152 03/30/20 0814  BP: (!) 166/68 (!) 109/95  Pulse: 94 95  Resp: 20 16  Temp: 98 F (36.7 C) 97.8 F (36.6 C)  SpO2: 100% 99%    Exam:  Constitutional:   Appears calm and comfortable Respiratory:   CTA bilaterally, no w/r/r.   Respiratory effort normal. No retractions or accessory muscle use Cardiovascular:   RRR, no m/r/g  No LE extremity edema    Normal pedal pulses Abdomen:   Abdomen appears normal; no tenderness or masses  No hernias  No HSM Musculoskeletal:   Digits/nails BUE: no clubbing, cyanosis, petechiae, infection  exam of joints, bones, muscles of at least one of following: head/neck, RUE, LUE,  RLE, LLE   o strength and tone normal, no atrophy, no abnormal movements o No tenderness, masses o Normal ROM, no contractures   gait and station Skin:   No rashes, lesions, ulcers  palpation of skin: no induration or nodules Neurologic:  Unable to evaluate due to the patient's inability to cooperate with exam. Psychiatric:  Unable to evaluate due to the patient's inability to cooperate with exam.  I have personally reviewed the following:   Today's Data   Vitals  Scheduled Meds:  feeding supplement (ENSURE ENLIVE)  237 mL Oral TID BM   feeding supplement (PRO-STAT SUGAR FREE 64)  30 mL Oral BID   glycopyrrolate  0.4 mg Intravenous Q4H   Continuous Infusions:  Principal Problem:   Failure to thrive in adult Active Problems:   HTN (hypertension)   CAP (community acquired pneumonia)   Hyperlipidemia   Hyponatremia   Hypoalbuminemia due to protein-calorie malnutrition (HCC)   Leukocytosis   AKI (acute kidney injury) ([place])   PMR (polymyalgia rheumatica) (HCC)   Dyspnea   Agitation   Overweight (BMI 25.0-29.9)   Palliative care by specialist   Goals of care, counseling/discussion   Terminal care   LOS: 8 days   A & P    Failure to thrive in adult: As below.  Now comfort care.    HTN (hypertension)    CAP (community acquired pneumonia): X-ray unrevealing on admission.  No evidence of hypoxia.  No other source of infection seen.  Procalcitonin elevated so started on some antibiotics.  White count has continued to worsen despite antibiotics and I suspect that this may be more secondary to stress margination from worsening condition.  We will stop antibiotics now that he is comfort care.  Lactic acidosis seen on admission: Despite leukocytosis, suspect lactic acid is more from severe intravascular volume depletion.    Hyperlipidemia    Hyponatremia: Likely in part due to dehydration.    Hypoalbuminemia due to severe protein-calorie malnutrition [facility]): He with  IV albumin, albumin only minimally improved.  Third spacing all fluids.  IV Lasix and albumin has done little to improve this as well.  Mentation worsening and is unable to take p.o.  Now comfort care.    Leukocytosis: Suspect stress margination from worsening renal function and overall decline.    AKI (acute kidney injury) ([place]) in the setting of recent stage III chronic kidney disease: Patient's renal function back in April was normal.  During last hospitalization in May, baseline around 1.5.  Presented to the emergency room with creatinine around 3.5.  Since that time, has steadily declined.  Not really responded to IV fluids given his third spacing.  Now comfort care.    PMR (polymyalgia rheumatica) (HCC)    Dyspnea    Agitation  Acute metabolic encephalopathy: Suspect large part of this is uremia.  Now comfort care.  I have  seen and examined this patient myself. I have spent 34 minutes in his evaluation and care.  DVT Prophylaxis: None CODE STATUS: DNR Family Communication: Family at bedside Disposition: Residential hospice  Status is: Inpatient  Remains inpatient appropriate because:Inpatient level of care appropriate due to severity of illness   Dispo:  Patient From: Home  Planned Disposition: Residential Hospice  Expected discharge date: 03/30/2020  Medically stable for discharge: No Barrier to discharge. No safe discharge.         Quynh Basso, DO [facility] Direct contact: see [email or web address]  7PM-7AM contact night coverage as above 03/30/2020, 5:30 PM  LOS: 8 days

## 2020-03-30 NOTE — TOC Progression Note (Signed)
Transition of Care [facility]) - Progression Note    Patient Details  Name: Jeffrey Ho MRN: [id number] Date of Birth: January 28, 1935  [facility] CM/SW Contact  Nonda Lou, [place] Phone Number: 03/30/2020, 9:04 AM  Clinical Narrative:    CSW contacted [facility] to inquire on bed availability. CSW was informed there are no beds available today. TOC team will continue to follow.  Expected Discharge Plan: Home w Home Health Services Barriers to Discharge: Continued Medical Work up  Expected Discharge Plan and Services Expected Discharge Plan: Home w Home Health Services   Discharge Planning Services: CM Consult Post Acute Care Choice: Home Health Living arrangements for the past 2 months: Single Family Home Expected Discharge Date: 03/29/20                                     Social Determinants of Health (SDOH) Interventions    Readmission Risk Interventions Readmission Risk Prevention Plan 03/27/2020  Transportation Screening Complete  Medication Review [facility]) Referral to Pharmacy  PCP or Specialist appointment within 3-5 days of discharge Complete  HRI or Home Care Consult Complete  SW Recovery Care/Counseling Consult Complete  Palliative Care Screening Complete  Skilled Nursing Facility Not Complete  SNF Comments MD requesting that CM wait until after palliative has seen pt. due to potential hospice appropiate  Some recent data might be hidden

## 2020-03-30 NOTE — Discharge Summary (Signed)
Discharge Summary  Jeffrey Ho QAS:[id number] DOB: 1934/12/27  PCP: [name], MD  Admit date: 03/22/2020 Anticipated discharge date: 03/29/2020  Time spent: 25 minutes  Recommendations for Outpatient Follow-up:  1. Patient is to be discharged to a residential hospice facility. 2. All home medications discontinued.  Patient being discharged on sublingual Roxanol and Ativan suspension as needed.  These medications may be adjusted as per hospice.  Discharge Diagnoses:  Active Hospital Problems   Diagnosis Date Noted   Failure to thrive in adult 02/11/2020   Palliative care by specialist    Goals of care, counseling/discussion    Terminal care    Overweight (BMI 25.0-29.9) 03/28/2020   Dyspnea    Agitation    PMR (polymyalgia rheumatica) (HCC) 02/23/2020   AKI (acute kidney injury) (HCC) 02/21/2020   Hypoalbuminemia due to protein-calorie malnutrition (HCC) 01/23/2020   Hyponatremia 01/23/2020   Leukocytosis 01/23/2020   CAP (community acquired pneumonia) 12/31/2019   HTN (hypertension) 12/31/2019   Hyperlipidemia 12/31/2019    Resolved Hospital Problems  No resolved problems to display.    Discharge Condition: Patient is actively dying. Moribund  Diet recommendation: Comfort feeds only  Vitals:   03/28/20 2152 03/30/20 0814  BP: (!) 166/68 (!) 109/95  Pulse: 94 95  Resp: 20 16  Temp: 98 F (36.7 C) 97.8 F (36.6 C)  SpO2: 100% 99%   Exam:  Constitutional:   The patient is moribund. No acute distress. Respiratory:   No increased work of breathing.  No wheezes, rales, or rhonchi  No tactile fremitus Cardiovascular:   Regular rate and rhythm  No murmurs, ectopy, or gallups.  No lateral PMI. No thrills. Abdomen:   Abdomen is soft, non-tender, non-distended  No hernias, masses, or organomegaly  Normoactive bowel sounds.  Musculoskeletal:   No cyanosis, clubbing, or edema Skin:   No rashes, lesions, ulcers  palpation  of skin: no induration or nodules Neurologic:   Unable to evaluate due to the patient's inability to cooperate with exam. Psychiatric:  Unable to evaluate due to the patient's inability to cooperate with exam.  History of present illness:  85 year old with past medical history of hypertension, hyperlipidemia and carotid artery occlusion although overall medically stable with little issues up until approximately 6 to 9 months ago who has had multiple hospitalizations over the last 6 months for decreased p.o. intake.  Admitted on 6/18 for failure to thrive along with confusion, acute kidney injury, hyponatremia and suspected pneumonia after noted to have leukocytosis.  Hospital Course:  Patient found to be very malnourished.  Attempts to hydrate have only led to third spacing with no improvement with IV albumin/Lasix.  Renal function has continued to decline.  Patient's mentation has continued to worsen as well.  After discussion with patient's wife today and then follow-up with palliative care, patient's wife has decided to make him comfort care.  Waiting for bed at residential hospice. Principal Problem:   Failure to thrive in adult: As below.  Now comfort care. Active Problems:   HTN (hypertension)   CAP (community acquired pneumonia): X-ray unrevealing on admission.  No evidence of hypoxia.  No other source of infection seen.  Procalcitonin elevated so started on some antibiotics.  White count has continued to worsen despite antibiotics and I suspect that this may be more secondary to stress margination from worsening condition.    Antibiotics stopped now that he is comfort care.  Lactic acidosis seen on admission: Despite leukocytosis, suspect lactic acid is more from severe  intravascular volume depletion.    Hyperlipidemia: Home meds stopped now that he is comfort care.    Hyponatremia: Likely in part due to dehydration.  Overweight: Patient meets criteria BMI greater than 25     Hypoalbuminemia due to severe protein-calorie malnutrition [facility]): He with IV albumin, albumin only minimally improved.  Third spacing all fluids.  IV Lasix and albumin has done little to improve this as well.  Mentation worsening and is unable to take p.o.  Now comfort care.    Leukocytosis: Suspect stress margination from worsening renal function and overall decline.    AKI (acute kidney injury) ([place]) in the setting of recent stage III chronic kidney disease: Patient's renal function back in April was normal.  During last hospitalization in May, baseline around 1.5.  Presented to the emergency room with creatinine around 3.5.  Since that time, has steadily declined.  Not really responded to IV fluids given his third spacing.  Now comfort care.    PMR (polymyalgia rheumatica) (HCC)   Dyspnea   Agitation  Acute metabolic encephalopathy: Suspect large part of this is uremia.  Now comfort care and awaiting a bed at a residential hospice facility.  Procedures:  None  Consultations:  Palliative care  Discharge Exam: BP (!) 109/95 (BP Location: Left Arm)    Pulse 95    Temp 97.8 F (36.6 C) (Oral)    Resp 16    Ht 6' (1.829 m)    Wt 92.1 kg    SpO2 99%    BMI 27.53 kg/m   General: Disoriented, delirious Cardiovascular: Regular rate and rhythm, S1-S2 Respiratory: Labored breathing, mostly upper airway noise  Discharge Instructions You were cared for by a hospitalist during your hospital stay. If you have any questions about your discharge medications or the care you received while you were in the hospital after you are discharged, you can call the unit and asked to speak with the hospitalist on call if the hospitalist that took care of you is not available. Once you are discharged, your primary care physician will handle any further medical issues. Please note that NO REFILLS for any discharge medications will be authorized once you are discharged, as it is imperative that you return to your  primary care physician (or establish a relationship with a primary care physician if you do not have one) for your aftercare needs so that they can reassess your need for medications and monitor your lab values.  Discharge Instructions    Activity as tolerated - No restrictions   Complete by: As directed    Call MD for:  severe uncontrolled pain   Complete by: As directed    Diet general   Complete by: As directed    Pleasure feeds only.   Discharge instructions   Complete by: As directed    End of life care   Increase activity slowly   Complete by: As directed      Allergies as of 03/30/2020   No Known Allergies     Medication List    STOP taking these medications   aspirin 81 MG tablet   atorvastatin 20 MG tablet Commonly known as: LIPITOR   clotrimazole-betamethasone cream Commonly known as: LOTRISONE   feeding supplement (ENSURE ENLIVE) Liqd   ferrous sulfate 325 (65 FE) MG tablet   fish oil-omega-3 fatty acids 0347 MG capsule   folic acid 1 MG tablet Commonly known as: FOLVITE   furosemide 40 MG tablet Commonly known as: LASIX  Linzess 72 MCG capsule Generic drug: linaclotide   metoprolol tartrate 25 MG tablet Commonly known as: LOPRESSOR   mirtazapine 15 MG tablet Commonly known as: REMERON   niacin 500 MG tablet   NON FORMULARY   ondansetron 4 MG tablet Commonly known as: ZOFRAN     TAKE these medications   atropine 1 % ophthalmic solution Place 2 drops into both eyes 3 (three) times daily.   bisacodyl 10 MG suppository Commonly known as: DULCOLAX Place 1 suppository (10 mg total) rectally daily as needed (Constipation).   LORazepam 2 MG/ML concentrated solution Commonly known as: ATIVAN Take 0.5 mLs (1 mg total) by mouth every 4 (four) hours as needed (agitation).   morphine CONCENTRATE 10 MG/0.5ML Soln concentrated solution Place 0.5 mLs (10 mg total) under the tongue every 4 (four) hours as needed for severe pain.        No  Known Allergies  Follow-up Information    [place], [facility] [place] Follow up.   Why: for home health services to resume Contact information: [address] [place] [phone]               The results of significant diagnostics from this hospitalization (including imaging, microbiology, ancillary and laboratory) are listed below for reference.    Significant Diagnostic Studies: DG Chest 2 View  Result Date: 03/22/2020 CLINICAL DATA:  Suspected sepsis. EXAM: CHEST - 2 VIEW COMPARISON:  02/11/2020.  CT 01/23/2020. FINDINGS: Mediastinum and hilar structures normal. Heart size normal. Bilateral small of infiltrate and or atelectasis/scarring again noted. Similar findings noted on prior exam. New mild left mid lung field subsegmental atelectasis/infiltrate. IMPRESSION: 1. Bilateral small pulmonary densities consistent with mild infiltrates and or atelectasis/scarring again noted. 2. New mild left mid lung field subsegmental atelectasis/infiltrate. Electronically Signed   By: Maisie Fus  Register   On: 03/22/2020 13:15   CT HEAD WO CONTRAST  Result Date: 03/22/2020 CLINICAL DATA:  Left-sided facial droop EXAM: CT HEAD WITHOUT CONTRAST TECHNIQUE: Contiguous axial images were obtained from the base of the skull through the vertex without intravenous contrast. COMPARISON:  02/16/2009, 02/11/2020 FINDINGS: Brain: No evidence of acute infarction, hemorrhage, hydrocephalus, extra-axial collection or mass lesion/mass effect. Scattered low-density changes within the periventricular and subcortical white matter compatible with chronic microvascular ischemic change. Moderate diffuse cerebral volume loss. Vascular: Atherosclerotic calcifications involving the large vessels of the skull base. No unexpected hyperdense vessel. Skull: Normal. Negative for fracture or focal lesion. Sinuses/Orbits: Air-fluid level within the left sphenoid sinus and scattered ethmoid air cell  opacification. Orbital structures intact. Other: None. IMPRESSION: 1. No acute intracranial findings. 2. Chronic microvascular ischemic change and cerebral volume loss. 3. Air-fluid level within the left sphenoid sinus and scattered ethmoid air cell opacification. Correlate for signs and symptoms of acute sinusitis. Electronically Signed   By: Duanne Guess D.O.   On: 03/22/2020 14:42   DG CHEST PORT 1 VIEW  Result Date: 03/27/2020 CLINICAL DATA:  Shortness of breath EXAM: PORTABLE CHEST 1 VIEW COMPARISON:  03/22/2020 FINDINGS: Small right pleural effusion. Worsened bilateral interstitial opacity, likely pulmonary edema. Normal cardiomediastinal contours. IMPRESSION: Worsened bilateral interstitial opacities, likely pulmonary edema. Small right pleural effusion. Electronically Signed   By: Deatra Robinson M.D.   On: 03/27/2020 00:34    Microbiology: Recent Results (from the past 240 hour(s))  Culture, blood (Routine x 2)     Status: None   Collection Time: 03/22/20 12:11 PM   Specimen: BLOOD RIGHT HAND  Result Value  Ref Range Status   Specimen Description BLOOD RIGHT HAND  Final   Special Requests   Final    BOTTLES DRAWN AEROBIC AND ANAEROBIC Blood Culture adequate volume   Culture   Final    NO GROWTH 5 DAYS Performed at [facility], [address] [place], [place] [address]    Report Status 03/27/2020 FINAL  Final  SARS Coronavirus 2 by RT PCR (hospital order, performed in [facility] hospital lab) Nasopharyngeal Nasopharyngeal Swab     Status: None   Collection Time: 03/22/20  5:22 PM   Specimen: Nasopharyngeal Swab  Result Value Ref Range Status   SARS Coronavirus 2 NEGATIVE NEGATIVE Final    Comment: (NOTE) SARS-CoV-2 target nucleic acids are NOT DETECTED.  The SARS-CoV-2 RNA is generally detectable in upper and lower respiratory specimens during the acute phase of infection. The lowest concentration of SARS-CoV-2 viral copies this assay can detect is 250 copies / mL. A  negative result does not preclude SARS-CoV-2 infection and should not be used as the sole basis for treatment or other patient management decisions.  A negative result may occur with improper specimen collection / handling, submission of specimen other than nasopharyngeal swab, presence of viral mutation(s) within the areas targeted by this assay, and inadequate number of viral copies (<250 copies / mL). A negative result must be combined with clinical observations, patient history, and epidemiological information.  Fact Sheet for Patients:   BoilerBrush.com.cyhttps://www.fda.gov/media/136312/download  Fact Sheet for Healthcare Providers: https://pope.com/https://www.fda.gov/media/136313/download  This test is not yet approved or  cleared by the [place] FDA and has been authorized for detection and/or diagnosis of SARS-CoV-2 by FDA under an Emergency Use Authorization (EUA).  This EUA will remain in effect (meaning this test can be used) for the duration of the COVID-19 declaration under Section 564(b)(1) of the Act, 21 U.S.C. section 360bbb-3(b)(1), unless the authorization is terminated or revoked sooner.  Performed at [facility], [address] [place], [place] [address]   Culture, blood (Routine x 2)     Status: None   Collection Time: 03/22/20  8:50 PM   Specimen: BLOOD LEFT ARM  Result Value Ref Range Status   Specimen Description BLOOD LEFT ARM  Final   Special Requests   Final    BOTTLES DRAWN AEROBIC AND ANAEROBIC Blood Culture adequate volume   Culture   Final    NO GROWTH 5 DAYS Performed at [facility], [address] [place], [place] [address]    Report Status 03/27/2020 FINAL  Final  MRSA PCR Screening     Status: None   Collection Time: 03/22/20 11:10 PM   Specimen: Nasal Mucosa; Nasopharyngeal  Result Value Ref Range Status   MRSA by PCR NEGATIVE NEGATIVE Final    Comment:        The GeneXpert MRSA Assay (FDA approved for NASAL specimens only), is one component of  a comprehensive MRSA colonization surveillance program. It is not intended to diagnose MRSA infection nor to guide or monitor treatment for MRSA infections. Performed at [facility], [address] [place], [place] [address]      Labs: Basic Metabolic Panel: Recent Labs  Lab 03/24/20 [phone] 03/25/20 0357 03/26/20 0338 03/27/20 0326 03/28/20 0322  NA 130* 128* 127* 130* 130*  K 4.6 4.3 4.1 4.5 4.8  CL 95* 96* 97* 96* 98  CO2 24 21* 19* 19* 17*  GLUCOSE 97 121* 97 135* 102*  BUN 83* 87*  98* 114* 127*  CREATININE 3.80* 4.03* 4.22* 4.64* 5.23*  CALCIUM 7.7* 7.6* 7.8* 8.5* 8.3*   Liver Function Tests: Recent Labs  Lab 03/24/20 0626 03/25/20 0357 03/26/20 0338 03/27/20 0326 03/28/20 0322  AST 53* 111* 102* 89* 74*  ALT 49* 77* 83* 80* 78*  ALKPHOS 86 112 111 112 91  BILITOT 0.6 0.6 0.5 0.5 0.6  PROT 5.5* 5.3* 5.5* 6.2* 5.8*  ALBUMIN 1.3* 1.2* 1.7* 2.2* 1.9*   No results for input(s): LIPASE, AMYLASE in the last 168 hours. No results for input(s): AMMONIA in the last 168 hours. CBC: Recent Labs  Lab 03/24/20 0626 03/25/20 0357 03/26/20 0338 03/27/20 0326  WBC 24.8* 24.6* 22.1* 31.7*  HGB 8.3* 7.7* 7.3* 7.9*  HCT 25.2* 24.0* 23.3* 24.8*  MCV 83.7 84.2 87.6 84.9  PLT 454* 331 338 344   Cardiac Enzymes: No results for input(s): CKTOTAL, CKMB, CKMBINDEX, TROPONINI in the last 168 hours. BNP: BNP (last 3 results) Recent Labs    02/17/20 0353 02/18/20 0420 02/19/20 0428  BNP 211.3* 151.9* 119.9*    ProBNP (last 3 results) No results for input(s): PROBNP in the last 8760 hours.  CBG: No results for input(s): GLUCAP in the last 168 hours.  I have spent 35 minutes in the evaluation and discharge of this patient.   Signed:  Fran Lowes, MD [facility] 03/30/2020, 5:25 PM

## 2020-03-30 NOTE — Plan of Care (Signed)

## 2020-03-30 NOTE — Progress Notes (Signed)
   Palliative Medicine Inpatient Follow Up Note   HPI: Per intake H&P --> Jeffrey Ho a 85 y.o.malewith medical history significant ofright eye blindness, carotid artery occlusion, hard of hearing, hyperlipidemia, hypertension. He has had had five hospitalizations in the past six months. He has had ongoing failure to thrive and physical weakness.  Palliative care was asked to see Jeffrey Ho to further address goals of care.   Today's Discussion (03/30/2020): Chart reviewed. Met with patient wife at bedside. She expressed that Jeffrey Ho had been awake this morning and able to recognize her.   We discussed his present trajectory. Provided "Gone from my Sight" book.   Offered support through listening. Jeffrey Ho shares that Jeffrey Ho was always a jokester and had a positive attitude about things in life.   Plan for transition to residential hospice once a bed is available  Questions and concerns addressed   Recommendations and Plan: -DNR/DNI -Focus of care shifted to focus on comfort, quality and dignity -No further diagnostics, lab sticks -Minimize medications to focus on symptom management -Add glycopyrrolate for terminal secretions -Plan to transition to residential hospice --[place] [facility]  Time Spent: 15 Greater than 50% of the time was spent in counseling and coordination of care ______________________________________________________________________________________ [place] Team Team Cell Phone: [phone] Please utilize secure chat with additional questions, if there is no response within 30 minutes please call the above phone number  Palliative Medicine Team providers are available by phone from 7am to 7pm daily and can be reached through the team cell phone.  Should this patient require assistance outside of these hours, please call the patient's attending physician.

## 2020-03-31 MED ORDER — DIPHENHYDRAMINE HCL 50 MG/ML IJ SOLN
25.0000 mg | Freq: Three times a day (TID) | INTRAMUSCULAR | Status: DC | PRN
  Administered 2020-03-31: 25 mg via INTRAVENOUS
  Filled 2020-03-31: qty 1

## 2020-03-31 MED ORDER — CHLORHEXIDINE GLUCONATE CLOTH 2 % EX PADS
6.0000 | MEDICATED_PAD | Freq: Every day | CUTANEOUS | Status: DC
  Administered 2020-03-31: 6 via TOPICAL

## 2020-03-31 NOTE — Progress Notes (Addendum)
   Palliative Medicine Inpatient Follow Up Note   HPI: Per intake H&P --> Terressa Koyanagi Barhamis a 85 y.o.malewith medical history significant ofright eye blindness, carotid artery occlusion, hard of hearing, hyperlipidemia, hypertension. He has had had five hospitalizations in the past six months. He has had ongoing failure to thrive and physical weakness.  Palliative care was asked to see Demitrious to further address goals of care.   Today's Discussion (03/31/2020): Chart reviewed. Met with patient wife at bedside. [name] shared that Nathanie has had some itching. We talked about starting some benadryl to aid in relief of this. We also discussed placing a foley catheter in the setting of urinary retention. Educated on giving ativan if needed to help with agitation/restlessness.  Plan for transition to residential hospice once a bed is available  Questions and concerns addressed   Recommendations and Plan: -DNR/DNI -Focus of care shifted to focus on comfort, quality and dignity -No further diagnostics, lab sticks -Minimize medications to focus on symptom management -Add glycopyrrolate for terminal secretions -Foley placement -Add benadryl to pruritis -Plan to transition to residential hospice --[place] [facility]  Time Spent: 25 Greater than 50% of the time was spent in counseling and coordination of care ______________________________________________________________________________________ [place] Team Team Cell Phone: [phone] Please utilize secure chat with additional questions, if there is no response within 30 minutes please call the above phone number  Palliative Medicine Team providers are available by phone from 7am to 7pm daily and can be reached through the team cell phone.  Should this patient require assistance outside of these hours, please call the patient's attending physician.

## 2020-03-31 NOTE — TOC Transition Note (Signed)
Transition of Care [facility]) - CM/SW Discharge Note   Patient Details  Name: Jeffrey Ho MRN: [id number] Date of Birth: [birth year]  [facility] CM/SW Contact:  Nonda Lou, LCSWA Phone Number: 03/31/2020, 12:24 PM   Clinical Narrative:     Patient will DC to: [facility] Family notified: wife Transport DV:VOHY  RN, patient, patient's family, and facility notified of DC. Discharge Summary and FL2 sent to facility. RN to call report prior to discharge [phone]). DC packet on chart. Ambulance transport requested for patient.   CSW will sign off for now as social work intervention is no longer needed. Please consult [place] again if new needs arise.     Barriers to Discharge: Continued Medical Work up   Patient Goals and CMS Choice Patient states their goals for this hospitalization and ongoing recovery are:: to return home w [facility] CMS Medicare.gov Compare Post Acute Care list provided to:: Other (Comment Required) Choice offered to / list presented to : Spouse  Discharge Placement                       Discharge Plan and Services   Discharge Planning Services: CM Consult Post Acute Care Choice: Home Health                               Social Determinants of Health (SDOH) Interventions     Readmission Risk Interventions Readmission Risk Prevention Plan 03/27/2020  Transportation Screening Complete  Medication Review [facility]) Referral to Pharmacy  PCP or Specialist appointment within 3-5 days of discharge Complete  HRI or Home Care Consult Complete  SW Recovery Care/Counseling Consult Complete  Palliative Care Screening Complete  Skilled Nursing Facility Not Complete  SNF Comments MD requesting that CM wait until after palliative has seen pt. due to potential hospice appropiate  Some recent data might be hidden

## 2020-03-31 NOTE — TOC Progression Note (Addendum)
Transition of Care [facility]) - Progression Note    Patient Details  Name: Jeffrey Ho MRN: [id number] Date of Birth: 1934-12-17  Transition of Care [facility]) CM/SW Contact  Nonda Lou, [place] Phone Number: 03/31/2020, 10:29 AM  Clinical Narrative:    CSW spoke with [facility]. Hospice requested H&P and DC Summary be faxed [phone] for review. Follow-up will be made with CSW once reviewed. CSW updated palliative and spouse.  Expected Discharge Plan: Home w Home Health Services Barriers to Discharge: Continued Medical Work up  Expected Discharge Plan and Services Expected Discharge Plan: Home w Home Health Services   Discharge Planning Services: CM Consult Post Acute Care Choice: Home Health Living arrangements for the past 2 months: Single Family Home Expected Discharge Date: 03/29/20                                     Social Determinants of Health (SDOH) Interventions    Readmission Risk Interventions Readmission Risk Prevention Plan 03/27/2020  Transportation Screening Complete  Medication Review Oceanographer) Referral to Pharmacy  PCP or Specialist appointment within 3-5 days of discharge Complete  HRI or Home Care Consult Complete  SW Recovery Care/Counseling Consult Complete  Palliative Care Screening Complete  Skilled Nursing Facility Not Complete  SNF Comments MD requesting that CM wait until after palliative has seen pt. due to potential hospice appropiate  Some recent data might be hidden

## 2020-03-31 NOTE — Discharge Summary (Signed)
Discharge Summary  Jeffrey Ho ZOX:[id number] DOB: 06/07/1935  PCP: Elfredia NevinsFusco, Lawrence, MD  Admit date: 03/22/2020 Anticipated discharge date: 03/31/2020  Time spent: 38 minutes  Recommendations for Outpatient Follow-up:  1. Patient is to be discharged to a residential hospice facility. 2. All home medications discontinued.  Patient being discharged on sublingual Roxanol and Ativan suspension as needed.  These medications may be adjusted as per hospice.  Discharge Diagnoses:  Active Hospital Problems   Diagnosis Date Noted  . Failure to thrive in adult 02/11/2020  . Palliative care by specialist   . Goals of care, counseling/discussion   . Terminal care   . Overweight (BMI 25.0-29.9) 03/28/2020  . Dyspnea   . Agitation   . PMR (polymyalgia rheumatica) (HCC) 02/23/2020  . AKI (acute kidney injury) (HCC) 02/21/2020  . Hypoalbuminemia due to protein-calorie malnutrition (HCC) 01/23/2020  . Hyponatremia 01/23/2020  . Leukocytosis 01/23/2020  . CAP (community acquired pneumonia) 12/31/2019  . HTN (hypertension) 12/31/2019  . Hyperlipidemia 12/31/2019    Resolved Hospital Problems  No resolved problems to display.    Discharge Condition: Patient is actively dying. Moribund  Diet recommendation: Comfort feeds only  Vitals:   03/30/20 0814 03/30/20 2035  BP: (!) 109/95 (!) 141/73  Pulse: 95 (!) 105  Resp: 16   Temp: 97.8 F (36.6 C) 97.6 F (36.4 C)  SpO2: 99% 100%   Exam:  Constitutional:  . The patient is moribund. No acute distress. Respiratory:  . No increased work of breathing. . No wheezes, rales, or rhonchi . No tactile fremitus Cardiovascular:  . Regular rate and rhythm . No murmurs, ectopy, or gallups. . No lateral PMI. No thrills. Abdomen:  . Abdomen is soft, non-tender, non-distended . No hernias, masses, or organomegaly . Normoactive bowel sounds.  Musculoskeletal:  . No cyanosis, clubbing, or edema Skin:  . No rashes, lesions,  ulcers . palpation of skin: no induration or nodules Neurologic:  . Unable to evaluate due to the patient's inability to cooperate with exam. Psychiatric:  Unable to evaluate due to the patient's inability to cooperate with exam.  History of present illness:  85 year old with past medical history of hypertension, hyperlipidemia and carotid artery occlusion although overall medically stable with little issues up until approximately 6 to 9 months ago who has had multiple hospitalizations over the last 6 months for decreased p.o. intake.  Admitted on 6/18 for failure to thrive along with confusion, acute kidney injury, hyponatremia and suspected pneumonia after noted to have leukocytosis.  Hospital Course:  Patient found to be very malnourished.  Attempts to hydrate have only led to third spacing with no improvement with IV albumin/Lasix.  Renal function has continued to decline.  Patient's mentation has continued to worsen as well.  After discussion with patient's wife today and then follow-up with palliative care, patient's wife has decided to make him comfort care.  Waiting for bed at residential hospice. Principal Problem:   Failure to thrive in adult: As below.  Now comfort care. Active Problems:   HTN (hypertension)   CAP (community acquired pneumonia): X-ray unrevealing on admission.  No evidence of hypoxia.  No other source of infection seen.  Procalcitonin elevated so started on some antibiotics.  White count has continued to worsen despite antibiotics and I suspect that this may be more secondary to stress margination from worsening condition.    Antibiotics stopped now that he is comfort care.  Lactic acidosis seen on admission: Despite leukocytosis, suspect lactic acid is more from  severe intravascular volume depletion.    Hyperlipidemia: Home meds stopped now that he is comfort care.    Hyponatremia: Likely in part due to dehydration.  Overweight: Patient meets criteria BMI greater  than 25    Hypoalbuminemia due to severe protein-calorie malnutrition [facility]): He with IV albumin, albumin only minimally improved.  Third spacing all fluids.  IV Lasix and albumin has done little to improve this as well.  Mentation worsening and is unable to take p.o.  Now comfort care.    Leukocytosis: Suspect stress margination from worsening renal function and overall decline.    AKI (acute kidney injury) (HCC) in the setting of recent stage III chronic kidney disease: Patient's renal function back in April was normal.  During last hospitalization in May, baseline around 1.5.  Presented to the emergency room with creatinine around 3.5.  Since that time, has steadily declined.  Not really responded to IV fluids given his third spacing.  Now comfort care.    PMR (polymyalgia rheumatica) (HCC)   Dyspnea   Agitation  Acute metabolic encephalopathy: Suspect large part of this is uremia.  Now comfort care and awaiting a bed at a residential hospice facility.  Procedures:  None  Consultations:  Palliative care  Discharge Exam: BP (!) 141/73 (BP Location: Right Arm)   Pulse (!) 105   Temp 97.6 F (36.4 C) (Oral)   Resp 16   Ht 6' (1.829 m)   Wt 92.1 kg   SpO2 100%   BMI 27.53 kg/m   General: Disoriented, delirious Cardiovascular: Regular rate and rhythm, S1-S2 Respiratory: Labored breathing, mostly upper airway noise  Discharge Instructions You were cared for by a hospitalist during your hospital stay. If you have any questions about your discharge medications or the care you received while you were in the hospital after you are discharged, you can call the unit and asked to speak with the hospitalist on call if the hospitalist that took care of you is not available. Once you are discharged, your primary care physician will handle any further medical issues. Please note that NO REFILLS for any discharge medications will be authorized once you are discharged, as it is imperative  that you return to your primary care physician (or establish a relationship with a primary care physician if you do not have one) for your aftercare needs so that they can reassess your need for medications and monitor your lab values.  Discharge Instructions    Activity as tolerated - No restrictions   Complete by: As directed    Call MD for:  severe uncontrolled pain   Complete by: As directed    Diet general   Complete by: As directed    Pleasure feeds only.   Discharge instructions   Complete by: As directed    End of life care   Increase activity slowly   Complete by: As directed      Allergies as of 03/31/2020   No Known Allergies     Medication List    STOP taking these medications   aspirin 81 MG tablet   atorvastatin 20 MG tablet Commonly known as: LIPITOR   clotrimazole-betamethasone cream Commonly known as: LOTRISONE   feeding supplement (ENSURE ENLIVE) Liqd   ferrous sulfate 325 (65 FE) MG tablet   fish oil-omega-3 fatty acids 1000 MG capsule   folic acid 1 MG tablet Commonly known as: FOLVITE   furosemide 40 MG tablet Commonly known as: LASIX   Linzess 72 MCG capsule Generic  drug: linaclotide   metoprolol tartrate 25 MG tablet Commonly known as: LOPRESSOR   mirtazapine 15 MG tablet Commonly known as: REMERON   niacin 500 MG tablet   NON FORMULARY   ondansetron 4 MG tablet Commonly known as: ZOFRAN     TAKE these medications   atropine 1 % ophthalmic solution Place 2 drops into both eyes 3 (three) times daily.   bisacodyl 10 MG suppository Commonly known as: DULCOLAX Place 1 suppository (10 mg total) rectally daily as needed (Constipation).   LORazepam 2 MG/ML concentrated solution Commonly known as: ATIVAN Take 0.5 mLs (1 mg total) by mouth every 4 (four) hours as needed (agitation).   morphine CONCENTRATE 10 MG/0.5ML Soln concentrated solution Place 0.5 mLs (10 mg total) under the tongue every 4 (four) hours as needed for severe  pain.         No Known Allergies  Follow-up Information    [place], [facility] [place] Follow up.   Why: for home health services to resume Contact information: [address] [place] [address] [phone]               The results of significant diagnostics from this hospitalization (including imaging, microbiology, ancillary and laboratory) are listed below for reference.    Significant Diagnostic Studies: DG Chest 2 View  Result Date: 03/22/2020 CLINICAL DATA:  Suspected sepsis. EXAM: CHEST - 2 VIEW COMPARISON:  02/11/2020.  CT 01/23/2020. FINDINGS: Mediastinum and hilar structures normal. Heart size normal. Bilateral small of infiltrate and or atelectasis/scarring again noted. Similar findings noted on prior exam. New mild left mid lung field subsegmental atelectasis/infiltrate. IMPRESSION: 1. Bilateral small pulmonary densities consistent with mild infiltrates and or atelectasis/scarring again noted. 2. New mild left mid lung field subsegmental atelectasis/infiltrate. Electronically Signed   By: Maisie Fus  Register   On: 03/22/2020 13:15   CT HEAD WO CONTRAST  Result Date: 03/22/2020 CLINICAL DATA:  Left-sided facial droop EXAM: CT HEAD WITHOUT CONTRAST TECHNIQUE: Contiguous axial images were obtained from the base of the skull through the vertex without intravenous contrast. COMPARISON:  02/16/2009, 02/11/2020 FINDINGS: Brain: No evidence of acute infarction, hemorrhage, hydrocephalus, extra-axial collection or mass lesion/mass effect. Scattered low-density changes within the periventricular and subcortical white matter compatible with chronic microvascular ischemic change. Moderate diffuse cerebral volume loss. Vascular: Atherosclerotic calcifications involving the large vessels of the skull base. No unexpected hyperdense vessel. Skull: Normal. Negative for fracture or focal lesion. Sinuses/Orbits: Air-fluid level within the left sphenoid sinus and scattered  ethmoid air cell opacification. Orbital structures intact. Other: None. IMPRESSION: 1. No acute intracranial findings. 2. Chronic microvascular ischemic change and cerebral volume loss. 3. Air-fluid level within the left sphenoid sinus and scattered ethmoid air cell opacification. Correlate for signs and symptoms of acute sinusitis. Electronically Signed   By: Duanne Guess D.O.   On: 03/22/2020 14:42   DG CHEST PORT 1 VIEW  Result Date: 03/27/2020 CLINICAL DATA:  Shortness of breath EXAM: PORTABLE CHEST 1 VIEW COMPARISON:  03/22/2020 FINDINGS: Small right pleural effusion. Worsened bilateral interstitial opacity, likely pulmonary edema. Normal cardiomediastinal contours. IMPRESSION: Worsened bilateral interstitial opacities, likely pulmonary edema. Small right pleural effusion. Electronically Signed   By: Deatra Robinson M.D.   On: 03/27/2020 00:34    Microbiology: Recent Results (from the past 240 hour(s))  Culture, blood (Routine x 2)     Status: None   Collection Time: 03/22/20 12:11 PM   Specimen: BLOOD RIGHT HAND  Result Value Ref Range Status  Specimen Description BLOOD RIGHT HAND  Final   Special Requests   Final    BOTTLES DRAWN AEROBIC AND ANAEROBIC Blood Culture adequate volume   Culture   Final    NO GROWTH 5 DAYS Performed at [facility], [address], [place], [place] [address]    Report Status 03/27/2020 FINAL  Final  SARS Coronavirus 2 by RT PCR (hospital order, performed in [facility] hospital lab) Nasopharyngeal Nasopharyngeal Swab     Status: None   Collection Time: 03/22/20  5:22 PM   Specimen: Nasopharyngeal Swab  Result Value Ref Range Status   SARS Coronavirus 2 NEGATIVE NEGATIVE Final    Comment: (NOTE) SARS-CoV-2 target nucleic acids are NOT DETECTED.  The SARS-CoV-2 RNA is generally detectable in upper and lower respiratory specimens during the acute phase of infection. The lowest concentration of SARS-CoV-2 viral copies this assay can detect is  250 copies / mL. A negative result does not preclude SARS-CoV-2 infection and should not be used as the sole basis for treatment or other patient management decisions.  A negative result may occur with improper specimen collection / handling, submission of specimen other than nasopharyngeal swab, presence of viral mutation(s) within the areas targeted by this assay, and inadequate number of viral copies (<250 copies / mL). A negative result must be combined with clinical observations, patient history, and epidemiological information.  Fact Sheet for Patients:   BoilerBrush.com.cy  Fact Sheet for Healthcare Providers: https://pope.com/  This test is not yet approved or  cleared by the [place] FDA and has been authorized for detection and/or diagnosis of SARS-CoV-2 by FDA under an Emergency Use Authorization (EUA).  This EUA will remain in effect (meaning this test can be used) for the duration of the COVID-19 declaration under Section 564(b)(1) of the Act, 21 U.S.C. section 360bbb-3(b)(1), unless the authorization is terminated or revoked sooner.  Performed at [facility], [address] [place], [place] [address]   Culture, blood (Routine x 2)     Status: None   Collection Time: 03/22/20  8:50 PM   Specimen: BLOOD LEFT ARM  Result Value Ref Range Status   Specimen Description BLOOD LEFT ARM  Final   Special Requests   Final    BOTTLES DRAWN AEROBIC AND ANAEROBIC Blood Culture adequate volume   Culture   Final    NO GROWTH 5 DAYS Performed at [facility], [address], [place], [place] [address]    Report Status 03/27/2020 FINAL  Final  MRSA PCR Screening     Status: None   Collection Time: 03/22/20 11:10 PM   Specimen: Nasal Mucosa; Nasopharyngeal  Result Value Ref Range Status   MRSA by PCR NEGATIVE NEGATIVE Final    Comment:        The GeneXpert MRSA Assay (FDA approved for NASAL specimens only), is  one component of a comprehensive MRSA colonization surveillance program. It is not intended to diagnose MRSA infection nor to guide or monitor treatment for MRSA infections. Performed at [facility], [address], [place], [place] [address]      Labs: Basic Metabolic Panel: Recent Labs  Lab 03/25/20 0357 03/26/20 0338 03/27/20 0326 03/28/20 0322  NA 128* 127* 130* 130*  K 4.3 4.1 4.5 4.8  CL 96* 97* 96* 98  CO2 21* 19* 19* 17*  GLUCOSE 121* 97 135* 102*  BUN 87* 98* 114* 127*  CREATININE 4.03* 4.22* 4.64* 5.23*  CALCIUM 7.6* 7.8*  8.5* 8.3*   Liver Function Tests: Recent Labs  Lab 03/25/20 0357 03/26/20 0338 03/27/20 0326 03/28/20 0322  AST 111* 102* 89* 74*  ALT 77* 83* 80* 78*  ALKPHOS 112 111 112 91  BILITOT 0.6 0.5 0.5 0.6  PROT 5.3* 5.5* 6.2* 5.8*  ALBUMIN 1.2* 1.7* 2.2* 1.9*   No results for input(s): LIPASE, AMYLASE in the last 168 hours. No results for input(s): AMMONIA in the last 168 hours. CBC: Recent Labs  Lab 03/25/20 0357 03/26/20 0338 03/27/20 0326  WBC 24.6* 22.1* 31.7*  HGB 7.7* 7.3* 7.9*  HCT 24.0* 23.3* 24.8*  MCV 84.2 87.6 84.9  PLT 331 338 344   Cardiac Enzymes: No results for input(s): CKTOTAL, CKMB, CKMBINDEX, TROPONINI in the last 168 hours. BNP: BNP (last 3 results) Recent Labs    02/17/20 0353 02/18/20 0420 02/19/20 0428  BNP 211.3* 151.9* 119.9*    ProBNP (last 3 results) No results for input(s): PROBNP in the last 8760 hours.  CBG: No results for input(s): GLUCAP in the last 168 hours.  I have spent 35 minutes in the evaluation and discharge of this patient.   Signed:  Karie Kirks, MD [facility] 03/31/2020, 11:02 AM

## 2020-04-04 DEATH — deceased

## 2020-05-18 DIAGNOSIS — Z66 Do not resuscitate: Secondary | ICD-10-CM

## 2020-09-24 IMAGING — CT CT HEAD W/O CM
4 series · 16 of 47 positions shown, 18 images · non-contrast
Comparison: 02/16/2009, 02/11/2020

CLINICAL DATA: Left-sided facial droop

EXAM:
CT HEAD WITHOUT CONTRAST
TECHNIQUE: Contiguous axial images were obtained from the base of the skull
through the vertex without intravenous contrast.

[Series 3: head without · axial · non-contrast · 0.52mm/px · z∈[-155,-35]mm · 7 of 33 slices shown, 9 images]
[im 5/33  brain]
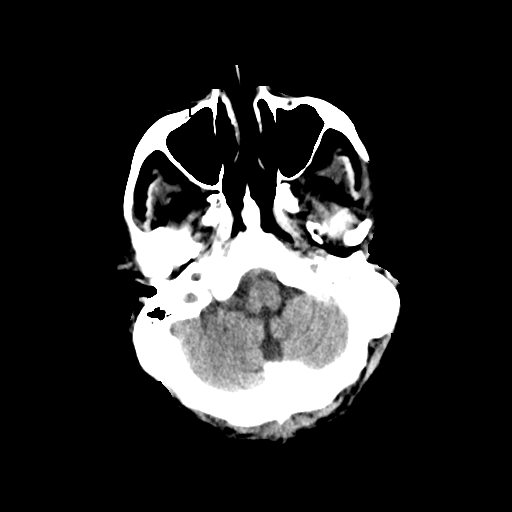
[im 5/33  bone]
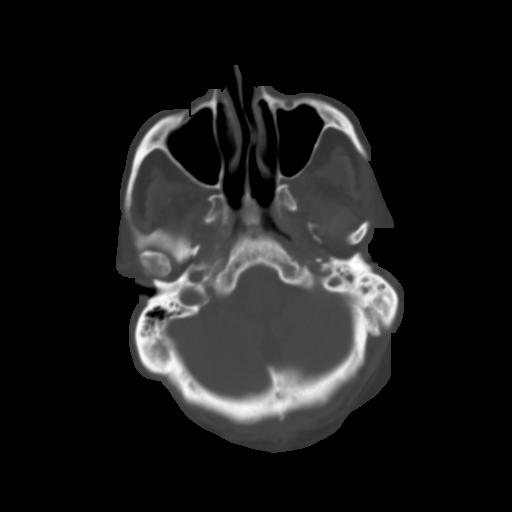
[im 9/33  brain]
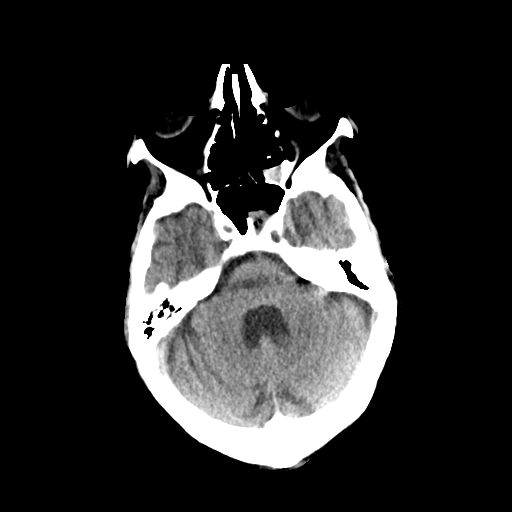
[im 13/33  brain]
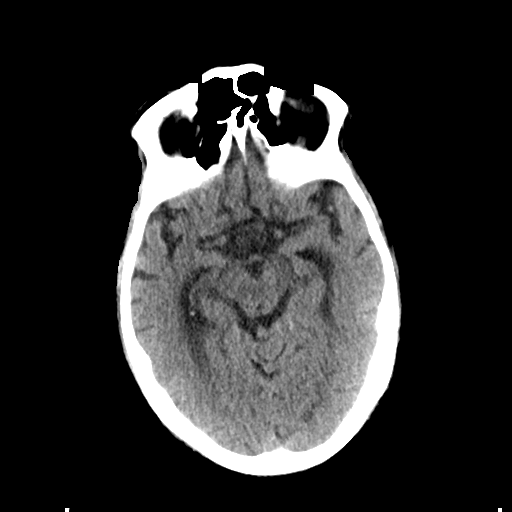
[im 17/33  brain]
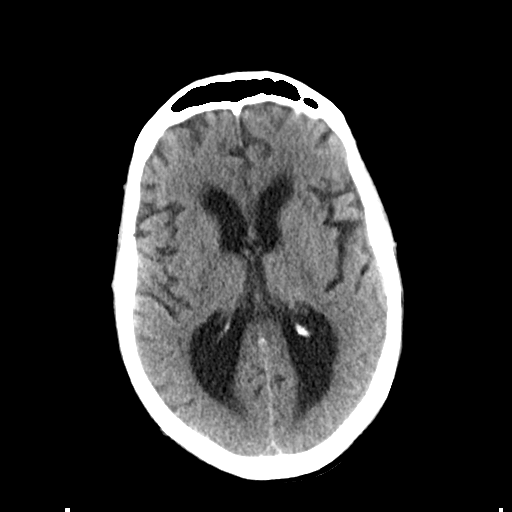
[im 21/33  brain]
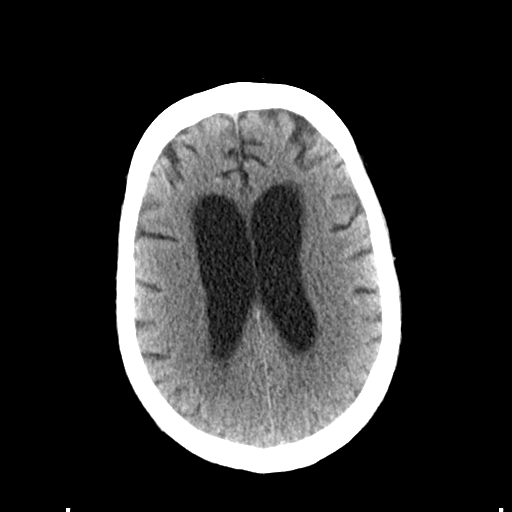
[im 21/33  bone]
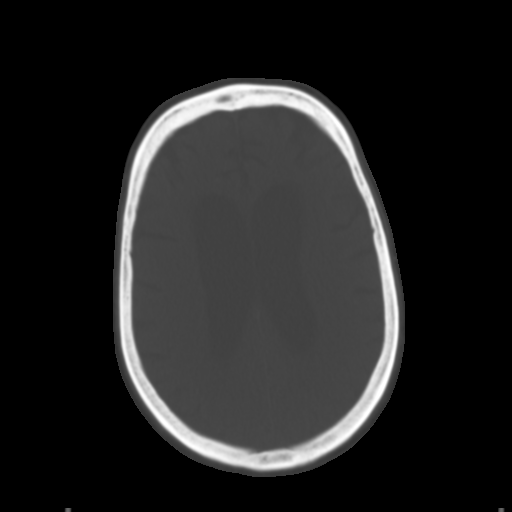
[im 25/33  brain]
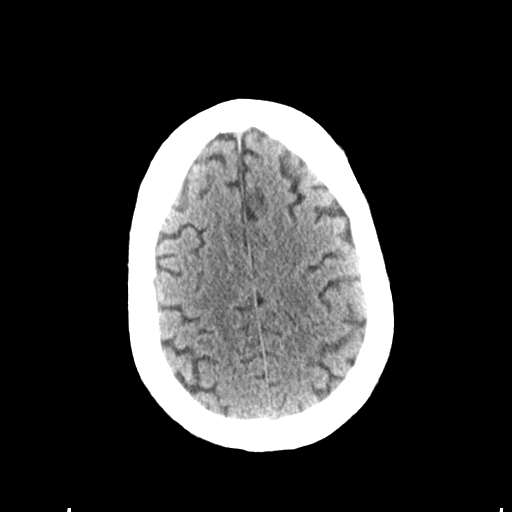
[im 29/33  brain]
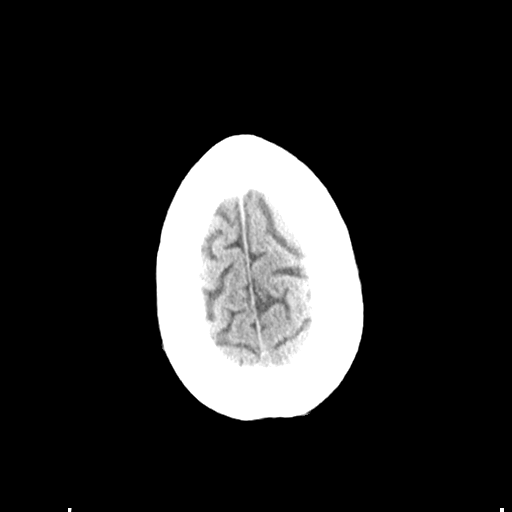

[Series 4: head bone · axial · 0.52mm/px · z∈[-159,-127]mm · 3 of 83 slices shown]
[im 9/83  bone]
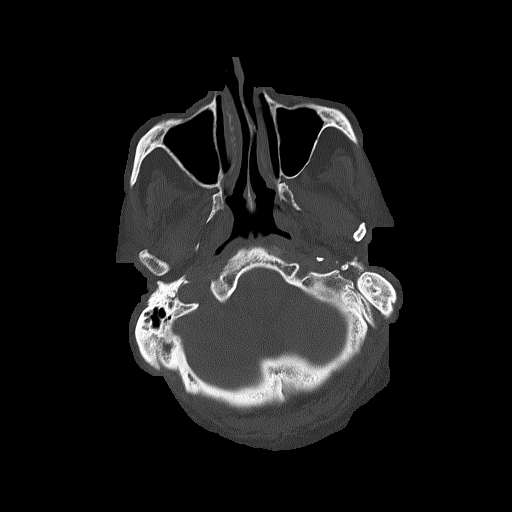
[im 17/83  bone]
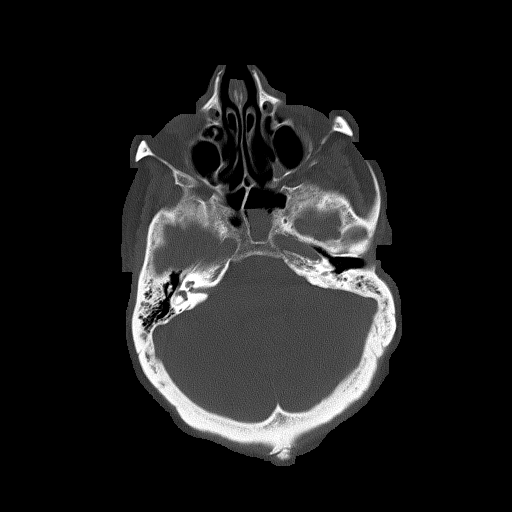
[im 25/83  bone]
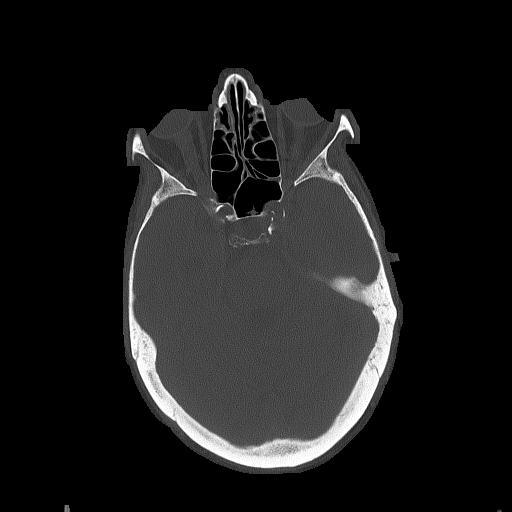

[Series 5: head without cor · coronal · non-contrast · 0.33mm/px · 3 of 79 slices shown]
[im 27/79  brain]
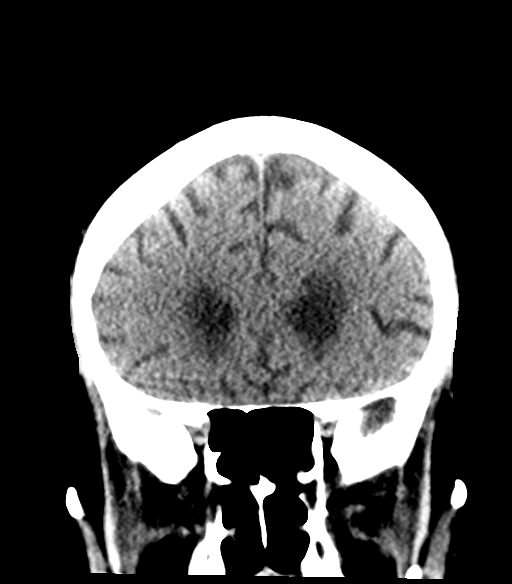
[im 35/79  brain]
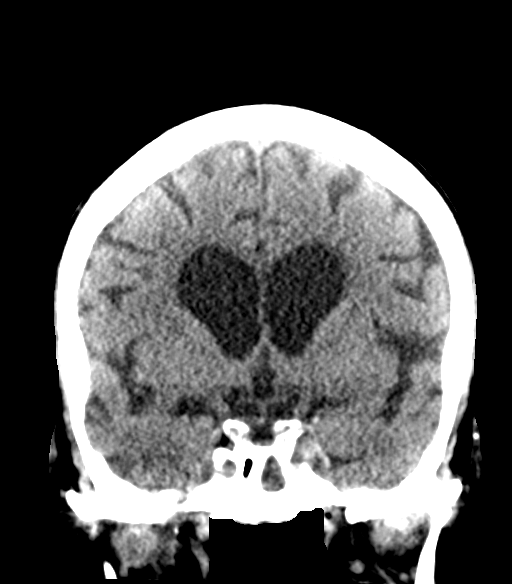
[im 44/79  brain]
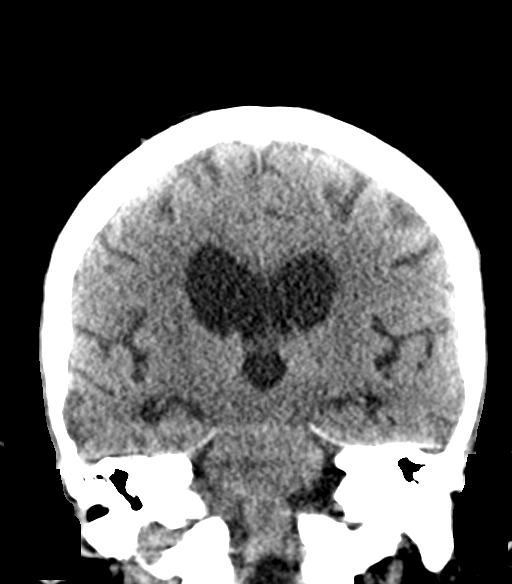

[Series 6: head without sag · sagittal · non-contrast · 0.36mm/px · 3 of 66 slices shown]
[im 22/66  brain]
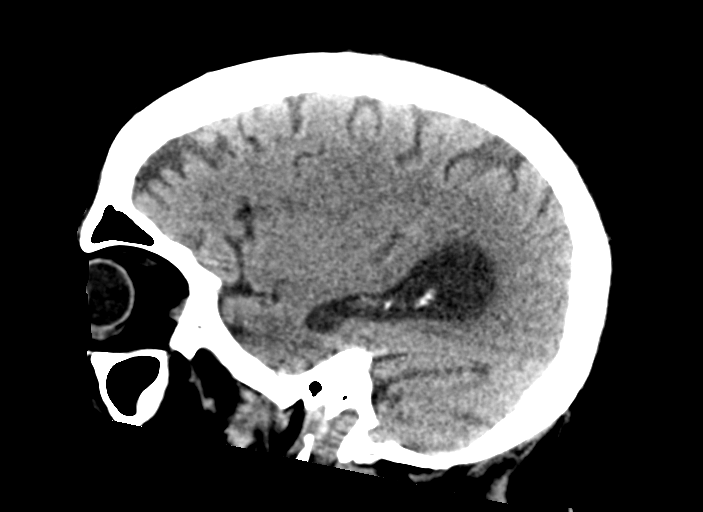
[im 33/66  brain]
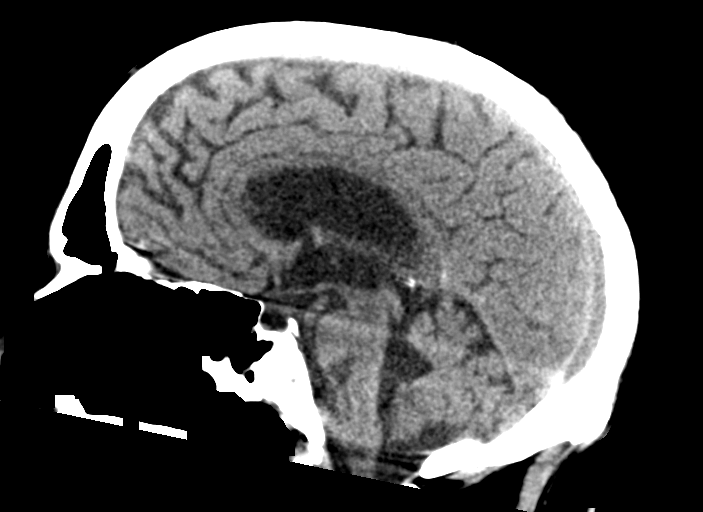
[im 44/66  brain]
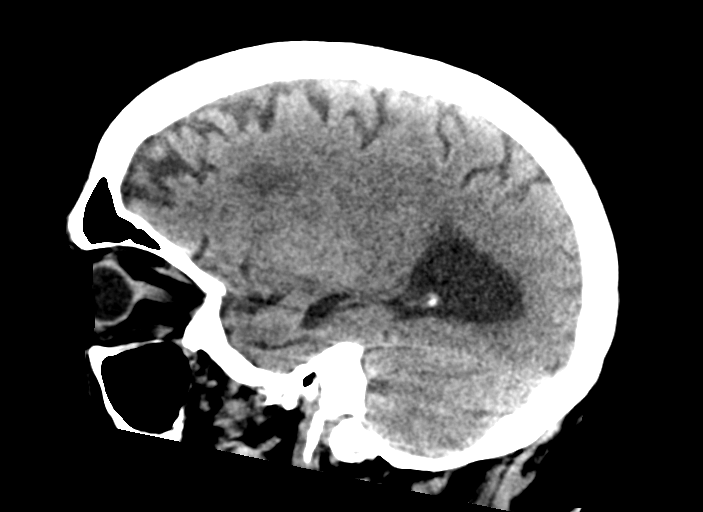

[16 of 47 positions shown; findings below may reference images not displayed]

FINDINGS: Brain: No evidence of acute infarction, hemorrhage, hydrocephalus,
extra-axial collection or mass lesion/mass effect. Scattered
low-density changes within the periventricular and subcortical white
matter compatible with chronic microvascular ischemic change.
Moderate diffuse cerebral volume loss.

Vascular: Atherosclerotic calcifications involving the large vessels
of the skull base. No unexpected hyperdense vessel.

Skull: Normal. Negative for fracture or focal lesion.

Sinuses/Orbits: Air-fluid level within the left sphenoid sinus and
scattered ethmoid air cell opacification. Orbital structures intact.

Other: None.
IMPRESSION: 1. No acute intracranial findings.
2. Chronic microvascular ischemic change and cerebral volume loss.
3. Air-fluid level within the left sphenoid sinus and scattered
ethmoid air cell opacification. Correlate for signs and symptoms of
acute sinusitis.
